# Patient Record
Sex: Male | Born: 1970
Health system: Southern US, Community
[De-identification: ages and names within clinical notes are randomized; demographics above are authoritative.]

## PROBLEM LIST (undated history)

## (undated) DIAGNOSIS — E785 Hyperlipidemia, unspecified: Secondary | ICD-10-CM

## (undated) DIAGNOSIS — I10 Essential (primary) hypertension: Secondary | ICD-10-CM

## (undated) DIAGNOSIS — M549 Dorsalgia, unspecified: Secondary | ICD-10-CM

## (undated) DIAGNOSIS — E119 Type 2 diabetes mellitus without complications: Secondary | ICD-10-CM

## (undated) DIAGNOSIS — T7840XA Allergy, unspecified, initial encounter: Secondary | ICD-10-CM

## (undated) DIAGNOSIS — M1990 Unspecified osteoarthritis, unspecified site: Secondary | ICD-10-CM

## (undated) DIAGNOSIS — K219 Gastro-esophageal reflux disease without esophagitis: Secondary | ICD-10-CM

## (undated) HISTORY — DX: Allergy, unspecified, initial encounter: T78.40XA

## (undated) HISTORY — DX: Gastro-esophageal reflux disease without esophagitis: K21.9

## (undated) HISTORY — DX: Dorsalgia, unspecified: M54.9

## (undated) HISTORY — DX: Hyperlipidemia, unspecified: E78.5

## (undated) HISTORY — PX: OTHER SURGICAL HISTORY: SHX169

## (undated) HISTORY — DX: Unspecified osteoarthritis, unspecified site: M19.90

## (undated) HISTORY — PX: FRACTURE SURGERY: SHX138

## (undated) HISTORY — PX: STERIOD INJECTION: SHX5046

---

## 2000-01-27 HISTORY — PX: HERNIA REPAIR: SHX51

## 2004-03-15 ENCOUNTER — Emergency Department: Payer: Self-pay | Admitting: Emergency Medicine

## 2005-07-20 ENCOUNTER — Emergency Department: Payer: Self-pay | Admitting: Emergency Medicine

## 2005-08-21 ENCOUNTER — Emergency Department: Payer: Self-pay | Admitting: Emergency Medicine

## 2005-08-29 ENCOUNTER — Emergency Department: Payer: Self-pay | Admitting: Emergency Medicine

## 2005-09-02 ENCOUNTER — Ambulatory Visit: Payer: Self-pay | Admitting: Nurse Practitioner

## 2006-01-26 HISTORY — PX: NASAL SINUS SURGERY: SHX719

## 2006-03-30 ENCOUNTER — Ambulatory Visit: Payer: Self-pay | Admitting: Otolaryngology

## 2006-07-03 ENCOUNTER — Emergency Department: Payer: Self-pay | Admitting: Emergency Medicine

## 2007-01-03 ENCOUNTER — Ambulatory Visit: Payer: Self-pay | Admitting: Gastroenterology

## 2008-04-23 IMAGING — CR RIGHT ELBOW - COMPLETE 3+ VIEW
1 series · 4 of 4 positions shown · non-contrast
Comparison: none

REASON FOR EXAM: Injury
COMMENTS:

[Series 1: view not recorded · 0.17mm/px · 4 of 4 slices shown]
[im 1/4]
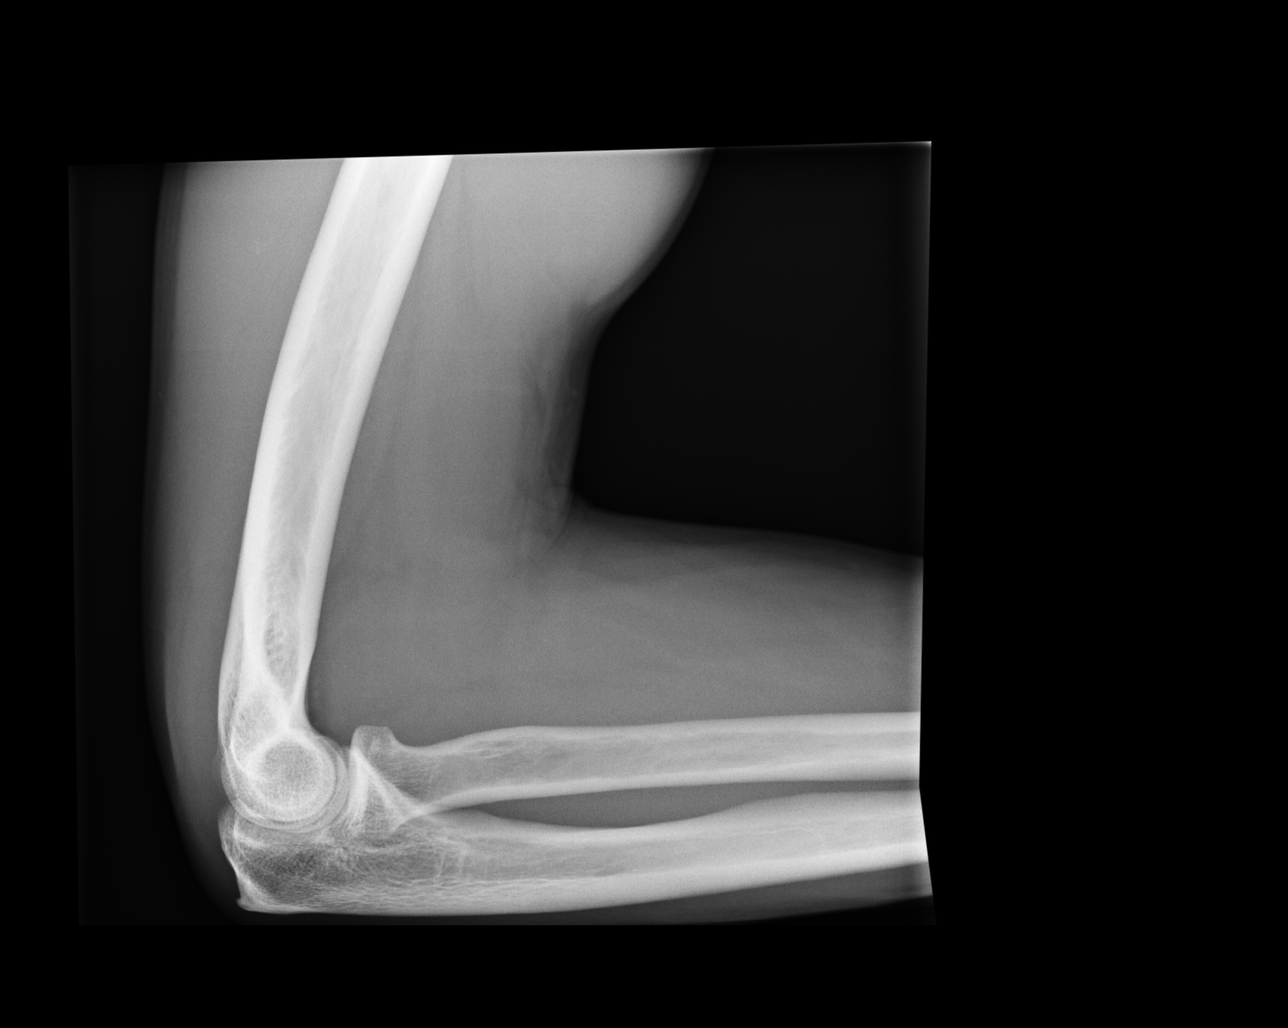
[im 2/4]
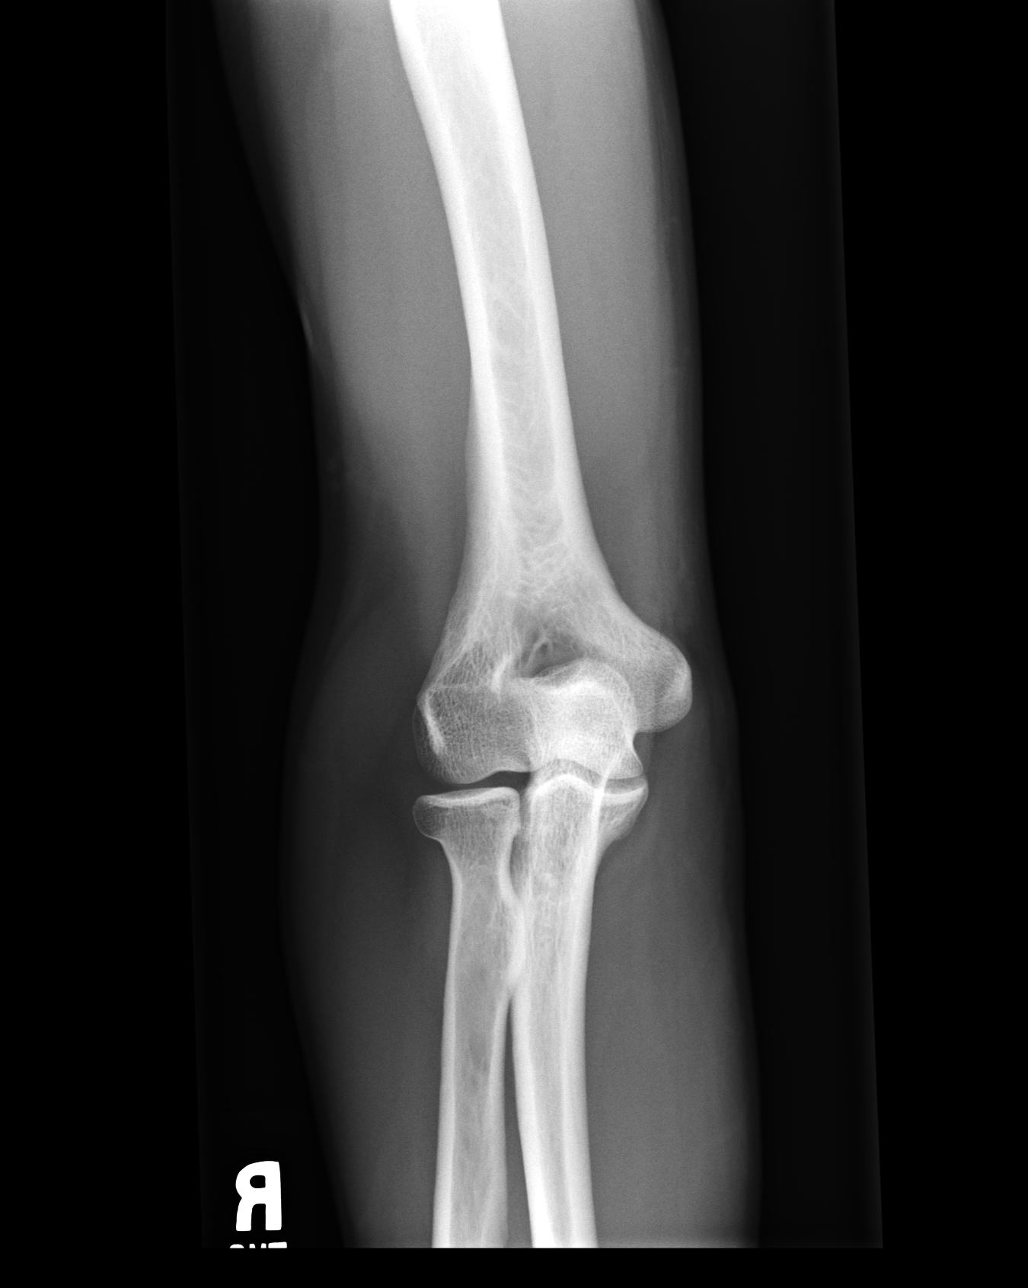
[im 3/4]
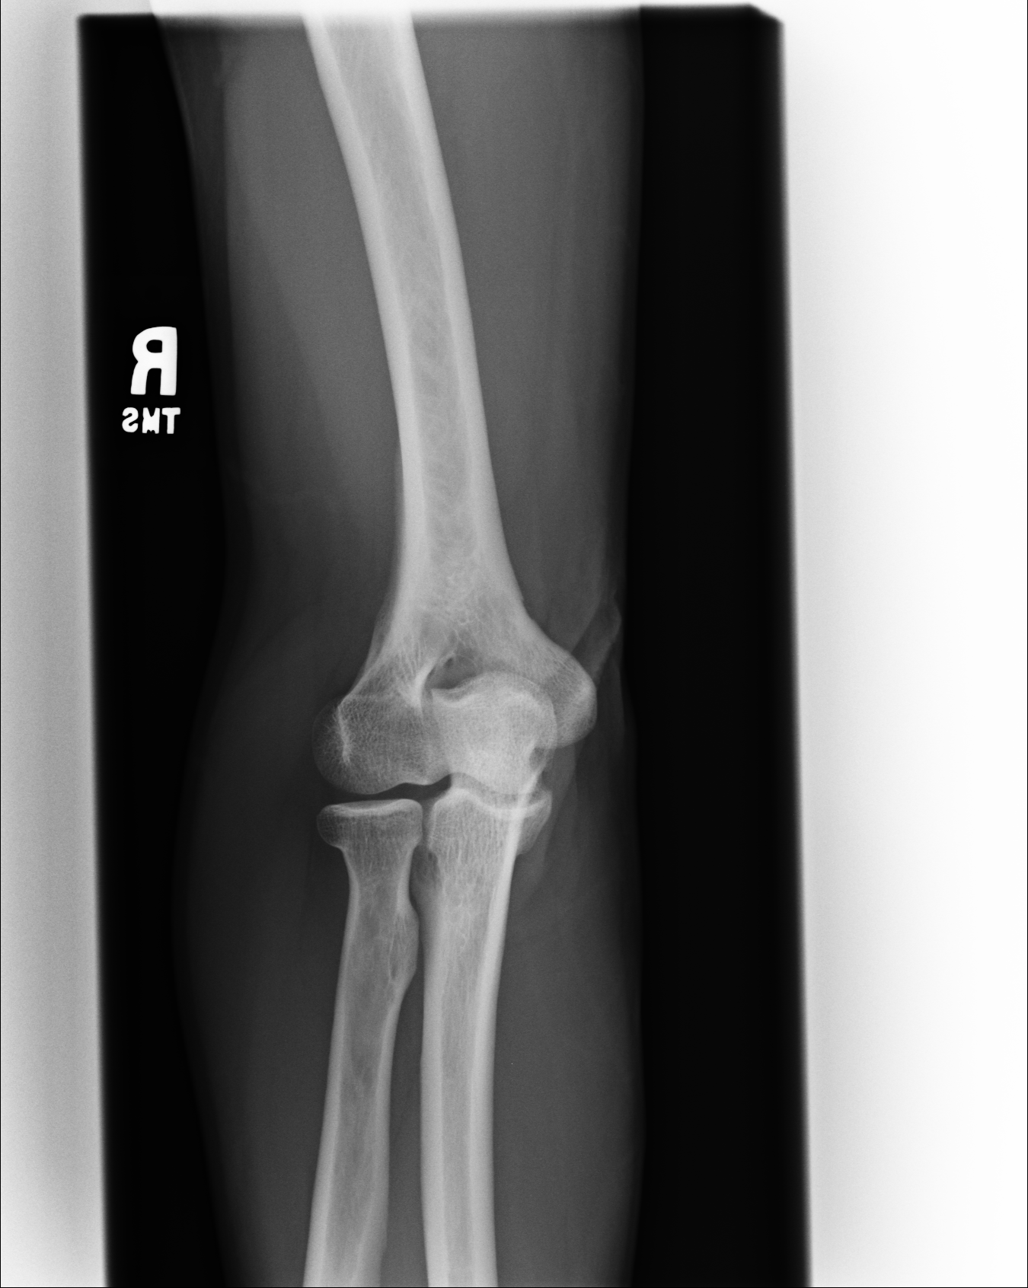
[im 4/4]
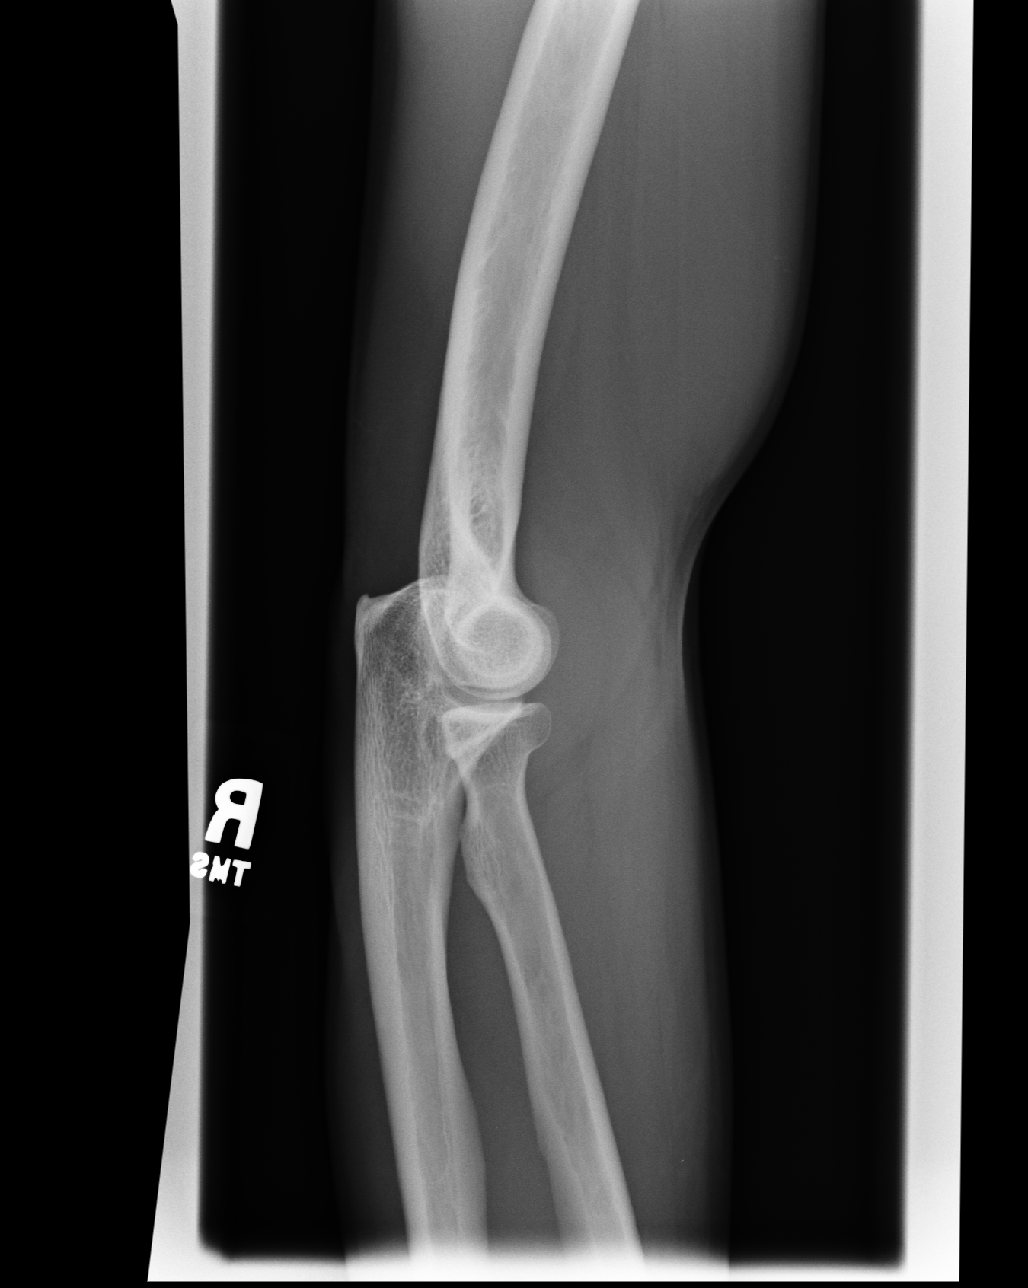

[4 of 4 positions shown; findings below may reference images not displayed]

PROCEDURE:     DXR - DXR ELBOW RT COMP W/OBLIQUES  - July 21, 2005  [DATE]

RESULT:     Views of the RIGHT elbow reveal the bones to be adequately
mineralized.  The radial head is intact.  There is a tiny olecranon spur.  I
do not see evidence of a joint effusion.  The supracondylar regions are
intact.
IMPRESSION: I do not see objective evidence of acute fracture of the RIGHT elbow.

## 2009-02-06 ENCOUNTER — Ambulatory Visit: Payer: Self-pay | Admitting: Otolaryngology

## 2009-04-08 ENCOUNTER — Ambulatory Visit: Payer: Self-pay | Admitting: Gastroenterology

## 2010-01-26 HISTORY — PX: HAND SURGERY: SHX662

## 2010-06-05 ENCOUNTER — Emergency Department: Payer: Self-pay | Admitting: Emergency Medicine

## 2010-06-12 ENCOUNTER — Ambulatory Visit: Payer: Self-pay | Admitting: Sports Medicine

## 2010-06-18 ENCOUNTER — Ambulatory Visit (HOSPITAL_BASED_OUTPATIENT_CLINIC_OR_DEPARTMENT_OTHER)
Admission: RE | Admit: 2010-06-18 | Discharge: 2010-06-18 | Disposition: A | Discharge status: Home / Self Care | Payer: BC Managed Care – PPO | Source: Ambulatory Visit | Attending: Orthopedic Surgery | Admitting: Orthopedic Surgery

## 2010-06-18 DIAGNOSIS — S62319A Displaced fracture of base of unspecified metacarpal bone, initial encounter for closed fracture: Secondary | ICD-10-CM | POA: Insufficient documentation

## 2010-06-18 DIAGNOSIS — IMO0002 Reserved for concepts with insufficient information to code with codable children: Secondary | ICD-10-CM | POA: Insufficient documentation

## 2010-06-18 DIAGNOSIS — K219 Gastro-esophageal reflux disease without esophagitis: Secondary | ICD-10-CM | POA: Insufficient documentation

## 2010-06-18 DIAGNOSIS — Y998 Other external cause status: Secondary | ICD-10-CM | POA: Insufficient documentation

## 2010-06-18 DIAGNOSIS — S62143A Displaced fracture of body of hamate [unciform] bone, unspecified wrist, initial encounter for closed fracture: Secondary | ICD-10-CM | POA: Insufficient documentation

## 2010-06-18 LAB — POCT HEMOGLOBIN-HEMACUE: Hemoglobin: 15.8 g/dL (ref 13.0–17.0)

## 2010-08-15 NOTE — Op Note (Signed)
NAMESALMAN, WELLEN               ACCOUNT NO.:  [id number]  MEDICAL RECORD NO.:  [id number]           PATIENT TYPE:  LOCATION:                                 FACILITY:  PHYSICIAN:  Cindee Salt, M.D.            DATE OF BIRTH:  DATE OF PROCEDURE:  06/18/2010 DATE OF DISCHARGE:                              OPERATIVE REPORT   PREOPERATIVE DIAGNOSES:  Fracture dislocations, right, middle, ring, and little fingers, carpometacarpal joints.  POSTOPERATIVE DIAGNOSES:  Fracture dislocations, right, middle, ring, and little fingers, carpometacarpal joints.  OPERATION:  Open reduction and internal fixation, carpometacarpal joints, right, middle, ring, and little fingers with pinning of fractured hamate, right hand.  SURGEON:  Cindee Salt, MD  ASSISTANT:  Artist Pais. Mina Marble, MD  ANESTHESIA:  Axillary general.  ANESTHESIOLOGIST:  Dr. Jean Rosenthal.  HISTORY:  The patient is a 40 year old male who suffered a fracture dislocation of the carpometacarpal joints middle, ring, and little fingers of his right hand when he punched a wall finding a stud.  X-rays revealed the fractures.  He was splinted and referred.  He has no prior history of injuries.  A CT scan revealed the fracture dislocations.  He is aware of risks and complications including infection, recurrence, injury to arteries, nerves, tendons, incomplete relief of symptoms, and dystrophy.  In the preoperative area, the patient is seen.  The extremity marked by both the patient and surgeon.  Antibiotic given.  PROCEDURE IN DETAIL:  The patient was brought to the operating room where an axillary block general anesthetic was given without difficulty. He was prepped using ChloraPrep, supine position, right arm free.  A 3- minute dry time was allowed.  Time-out taken confirming the patient and procedure.  The limb was exsanguinated with an Esmarch bandage. Tourniquet was placed high and the arm was inflated to 250 mmHg. Transverse  incision was made over the carpometacarpal of the middle, ring, and little finger, carried down through subcutaneous tissue. Bleeders were electrocauterized with bipolar.  Dorsal sensory nerve identified and protected.  The carpometacarpal joints were opened to each of the three fingers.  The fracture of the hamate was immediately apparent.  A fracture of the third metacarpal was also apparent with a dislocation of the ring.  The carpometacarpal fracture of the middle finger was reduced.  This was pinned with a 0.062 K-wire.  This brought the ring finger into position.  This was pinned with a 0.062 K-wire into the hamate, stabilizing it.  The remainder of the fractured hamate over the little finger was then reduced.  This was pinned with two 0.035 K- wires.  A size 0.045 K-wire was then used to pin the metacarpal to the hamate.  This maintained position of all three fractures and fracture of the hamate.  X-rays confirmed positioning.  These were reduced with flexion/extension.  There was normal alignment.  The wounds were irrigated.  The capsules were closed with figure-of-eight 4-0 Vicryl sutures, the subcutaneous tissue with interrupted 4-0 Vicryl, and the skin with interrupted 4-0 Vicryl Rapide sutures.  The pins left protruding through the skin were  bent and capped.  A sterile compressive dressing and splint applied.  On deflation of the tourniquet, all fingers immediately pinked; and he was taken to the recovery room for observation in satisfactory condition.  He will be discharged to home to return to the [facility] [place] in 1 week on Percocet.          ______________________________ Cindee Salt, M.D.     GK/MEDQ  D:  06/18/2010  T:  06/19/2010  Job:  [id number]  Electronically Signed by Cindee Salt M.D. on 08/15/2010 09:15:01 AM

## 2014-08-27 ENCOUNTER — Ambulatory Visit (INDEPENDENT_AMBULATORY_CARE_PROVIDER_SITE_OTHER): Payer: BLUE CROSS/BLUE SHIELD | Admitting: Family Medicine

## 2014-08-27 ENCOUNTER — Encounter: Payer: Self-pay | Admitting: Family Medicine

## 2014-08-27 VITALS — BP 132/78 | HR 61 | Temp 98.5°F | Ht 71.4 in | Wt 208.3 lb

## 2014-08-27 DIAGNOSIS — K649 Unspecified hemorrhoids: Secondary | ICD-10-CM | POA: Diagnosis not present

## 2014-08-27 DIAGNOSIS — K2100 Gastro-esophageal reflux disease with esophagitis, without bleeding: Secondary | ICD-10-CM

## 2014-08-27 DIAGNOSIS — E785 Hyperlipidemia, unspecified: Secondary | ICD-10-CM | POA: Diagnosis not present

## 2014-08-27 DIAGNOSIS — Z800 Family history of malignant neoplasm of digestive organs: Secondary | ICD-10-CM

## 2014-08-27 DIAGNOSIS — K589 Irritable bowel syndrome without diarrhea: Secondary | ICD-10-CM

## 2014-08-27 DIAGNOSIS — E663 Overweight: Secondary | ICD-10-CM

## 2014-08-27 DIAGNOSIS — Z23 Encounter for immunization: Secondary | ICD-10-CM | POA: Diagnosis not present

## 2014-08-27 LAB — LIPID PANEL PICCOLO, WAIVED
CHOL/HDL RATIO PICCOLO,WAIVE: 3.8 mg/dL
Cholesterol Piccolo, Waived: 166 mg/dL (ref <200)
HDL Chol Piccolo, Waived: 44 mg/dL — ABNORMAL LOW (ref >59)
LDL CHOL CALC PICCOLO WAIVED: 84 mg/dL (ref <100)
TRIGLYCERIDES PICCOLO,WAIVED: 191 mg/dL — AB (ref <150)
VLDL Chol Calc Piccolo,Waive: 38 mg/dL — ABNORMAL HIGH (ref <30)

## 2014-08-27 MED ORDER — RABEPRAZOLE SODIUM 20 MG PO TBEC: 20.0000 mg | DELAYED_RELEASE_TABLET | Freq: Every day | ORAL | Status: DC

## 2014-08-27 MED ORDER — PRAVASTATIN SODIUM 40 MG PO TABS: 40.0000 mg | ORAL_TABLET | Freq: Every day | ORAL | Status: DC

## 2014-08-27 MED ORDER — FLUNISOLIDE 25 MCG/ACT (0.025%) NA SOLN: 2.0000 | Freq: Every day | NASAL | Status: DC | PRN

## 2014-08-27 MED ORDER — TIZANIDINE HCL 4 MG PO TABS: 4.0000 mg | ORAL_TABLET | Freq: Three times a day (TID) | ORAL | Status: DC | PRN

## 2014-08-27 NOTE — Assessment & Plan Note (Signed)
Well controlled on current regimen. Continue current regimen. Continue to monitor.  

## 2014-08-27 NOTE — Patient Instructions (Signed)
Dyslipidemia  Dyslipidemia is an imbalance of the lipids in your blood. Lipids are waxy, fat-like proteins that your body needs in small amounts. Dyslipidemia often involves the lipids cholesterol or triglycerides. Common forms of dyslipidemia are:  · High levels of bad cholesterol (LDL cholesterol). LDL cholesterol is the type of cholesterol that causes heart disease.  · Low levels of good cholesterol (HDL cholesterol). HDL cholesterol is the type of cholesterol that helps protect against heart disease.  · High levels of triglycerides. Triglycerides are a fatty substance in the blood linked to a buildup of plaque on your arteries.  RISK FACTORS  · Increased age.  · Having a family history of high cholesterol.  · Certain medicines, including birth control pills, diuretics, beta-blockers, and some medicines for depression.  · Smoking.  · Eating a high-fat diet.  · Being overweight.  · Medical conditions such as diabetes, polycystic ovary syndrome, pregnancy, kidney disease, and hypothyroidism.  · Lack of regular exercise.  SIGNS AND SYMPTOMS  There are no signs or symptoms with dyslipidemia.   DIAGNOSIS   A simple blood test called a fasting blood test can be done to determine your level of:  · Total cholesterol. This is the combined number of LDL cholesterol and HDL cholesterol. A healthy number is lower than 200.  · LDL cholesterol. The goal number for LDL cholesterol is different for each person depending on risk factors. Ask your health care provider what your LDL cholesterol number should be.  · HDL cholesterol. A healthy level of HDL cholesterol is 60 or higher. A number lower than 40 for men or 50 for women is a danger sign.  · Triglycerides. A healthy triglyceride number is less than 150.  TREATMENT   Dyslipidemia is a treatable condition. Your health care provider will advise you on what type of treatment is best based on your age, your test results, and current guidelines. Treatment may include:   · Dietary  changes. A dietitian can help you create a meal plan. You may need to:  ¨ Eat more foods that contain omega-3s, such as salmon and other fish.  ¨ Replace saturated fats and trans fats in your diet with healthy fats such as nuts, seeds, avocados, olive oil, and canola oil.  · Regular exercise. This can help lower your LDL cholesterol, raise your HDL cholesterol, and help with weight management. Check with your health care provider before beginning an exercise program. Most people should participate in 30 minutes of brisk exercise 5 days a week.  · Quitting smoking.  · Medicines to lower LDL cholesterol and triglycerides.  Your health care provider will monitor your lipid levels with regular blood tests.  HOME CARE INSTRUCTIONS  · Eat a healthy diet. Follow any diet instructions if they were given to you by your health care provider.  · Maintain a healthy weight.  · Exercise regularly based on the recommendations of your health care provider.  · Do not use any tobacco products, including cigarettes, chewing tobacco, or electronic cigarettes.  · Take medicines only as directed by your health care provider.  · Keep all follow-up visits as directed by your health care provider.  SEEK MEDICAL CARE IF:  You are having possible side effects from your medicines.  Document Released: 01/17/2013 Document Revised: 05/29/2013 Document Reviewed: 01/17/2013  [employer]® Patient Information ©2015 [employer]. This information is not intended to replace advice given to you by your health care provider. Make sure you discuss any questions you have   with your health care provider.

## 2014-08-27 NOTE — Assessment & Plan Note (Signed)
Continue to follow with GI. Continue to monitor.

## 2014-08-27 NOTE — Assessment & Plan Note (Addendum)
Has been off medicine for about 2 months. Levels not as high as expected today. Will restart medicine and recheck in 3 months with LFTs and do PE at that time.

## 2014-08-27 NOTE — Assessment & Plan Note (Signed)
Continue to follow with GI. Continue to monitor.  

## 2014-08-27 NOTE — Progress Notes (Signed)
BP 132/78 mmHg  Pulse 61  Temp(Src) 98.5 F (36.9 C)  Ht 5' 11.4" (1.814 m)  Wt 208 lb 4.8 oz (94.484 kg)  BMI 28.71 kg/m2  SpO2 98%   Subjective:    Patient ID: [name], male    DOB: [birth year], 44 y.o.   MRN: [id number]  HPI: DARYLL SPISAK is a 44 y.o. male  Chief Complaint  Patient presents with  . Hyperlipidemia  . Gastrophageal Reflux   HYPERLIPIDEMIA Hyperlipidemia status: high triglycerides Satisfied with current treatment?  yes Side effects:  no Medication compliance: poor compliance- hasn't taken it in a couple of months Past cholesterol meds: pravastatin (pravachol) Supplements: none Aspirin:  no Chest pain:  no Coronary artery disease:  no Family history CAD:  no Family history early CAD:  no  GERD GERD control status: Under good control Satisfied with current treatment?: yes Heartburn frequency: not getting  Medication side effects: no Medication compliance: execellent Previous GERD medications: protonix, nexium, prilosec- all failed Antacid use frequency: rare Duration: chronic 15+ years Nature: burning Location: substernal Heartburn duration: minutes  Alleviatiating factors: medication Aggravating factors: spicy food Dysphagia: no Odynophagia: no Hematemesis: no  Blood in stool: previously, not recently EGD: yes   IBS- has had colonoscopies 4x, last one had a polyp and is due in 3 years. Stable. Tends to have more of the diarrhea   Relevant past medical, surgical, family and social history reviewed and updated as indicated. Interim medical history since our last visit reviewed. Allergies and medications reviewed and updated.  Review of Systems  Constitutional: Negative.   Respiratory: Negative.   Cardiovascular: Negative.   Gastrointestinal: Negative.   Genitourinary: Negative.   Musculoskeletal: Positive for back pain. Negative for myalgias, joint swelling, arthralgias, gait problem, neck pain and neck stiffness.   Psychiatric/Behavioral: Negative.     Per HPI unless specifically indicated above     Objective:    BP 132/78 mmHg  Pulse 61  Temp(Src) 98.5 F (36.9 C)  Ht 5' 11.4" (1.814 m)  Wt 208 lb 4.8 oz (94.484 kg)  BMI 28.71 kg/m2  SpO2 98%  Wt Readings from Last 3 Encounters:  08/27/14 208 lb 4.8 oz (94.484 kg)    Physical Exam  Constitutional: He is oriented to person, place, and time. He appears well-developed and well-nourished. No distress.  HENT:  Head: Normocephalic and atraumatic.  Right Ear: Hearing normal.  Left Ear: Hearing normal.  Nose: Nose normal.  Eyes: Conjunctivae and lids are normal. Right eye exhibits no discharge. Left eye exhibits no discharge. No scleral icterus.  Cardiovascular: Normal rate and regular rhythm.  Exam reveals no gallop and no friction rub.   No murmur heard. Pulmonary/Chest: Effort normal and breath sounds normal. No respiratory distress. He has no wheezes. He has no rales. He exhibits no tenderness.  Abdominal: Soft. Bowel sounds are normal. He exhibits distension. He exhibits no mass. There is no tenderness. There is no rebound and no guarding.  Musculoskeletal: Normal range of motion.  Neurological: He is alert and oriented to person, place, and time.  Skin: Skin is warm, dry and intact. No rash noted. No erythema. There is pallor.  Psychiatric: He has a normal mood and affect. His speech is normal and behavior is normal. Judgment and thought content normal. Cognition and memory are normal.  Nursing note and vitals reviewed.   Results for orders placed or performed during the hospital encounter of 06/18/10  Hemoglobin-hemacue, POC  Result Value Ref Range  Hemoglobin 15.8 13.0 - 17.0 g/dL      Assessment & Plan:   Problem List Items Addressed This Visit      Digestive   Gastroesophageal reflux disease with esophagitis    Well controlled on current regimen. Continue current regimen. Continue to monitor.       Relevant Orders    CBC with Differential/Platelet   Comprehensive metabolic panel   IBS (irritable bowel syndrome)    Continue to follow with GI. Continue to monitor.       Relevant Medications   RABEprazole (ACIPHEX) 20 MG tablet     Other   HLD (hyperlipidemia) - Primary    Has been off medicine for about 2 months. Levels not as high as expected today. Will restart medicine and recheck in 3 months with LFTs and do PE at that time.       Relevant Medications   pravastatin (PRAVACHOL) 40 MG tablet   Other Relevant Orders   Comprehensive metabolic panel   Lipid Panel Piccolo, Waived   Family history of colon cancer    Continue to follow with GI. Continue to monitor.        Other Visit Diagnoses    Hemorrhoids, unspecified hemorrhoid type        Relevant Medications    pravastatin (PRAVACHOL) 40 MG tablet    Other Relevant Orders    CBC with Differential/Platelet    Overweight        Will check CMP and TSH today. Await results. Work on diet and exercise.     Relevant Orders    Comprehensive metabolic panel    TSH    Immunization due        Relevant Orders    Tdap vaccine greater than or equal to 7yo IM (Completed)        Follow up plan: Return in about 3 months (around 11/27/2014) for Cholesterol recheck and PE.

## 2014-08-28 ENCOUNTER — Other Ambulatory Visit: Payer: Self-pay | Admitting: Orthopedic Surgery

## 2014-08-28 ENCOUNTER — Telehealth: Payer: Self-pay | Admitting: Family Medicine

## 2014-08-28 DIAGNOSIS — M5126 Other intervertebral disc displacement, lumbar region: Secondary | ICD-10-CM

## 2014-08-28 LAB — CBC WITH DIFFERENTIAL/PLATELET
Basophils Absolute: 0 10*3/uL (ref 0.0–0.2)
Basos: 0 %
EOS (ABSOLUTE): 0.1 10*3/uL (ref 0.0–0.4)
EOS: 2 %
Hematocrit: 45.4 % (ref 37.5–51.0)
Hemoglobin: 15.5 g/dL (ref 12.6–17.7)
IMMATURE GRANS (ABS): 0 10*3/uL (ref 0.0–0.1)
IMMATURE GRANULOCYTES: 0 %
LYMPHS ABS: 1.8 10*3/uL (ref 0.7–3.1)
Lymphs: 25 %
MCH: 31.1 pg (ref 26.6–33.0)
MCHC: 34.1 g/dL (ref 31.5–35.7)
MCV: 91 fL (ref 79–97)
MONOCYTES: 7 %
Monocytes Absolute: 0.5 10*3/uL (ref 0.1–0.9)
NEUTROS ABS: 4.7 10*3/uL (ref 1.4–7.0)
Neutrophils: 66 %
PLATELETS: 197 10*3/uL (ref 150–379)
RBC: 4.99 x10E6/uL (ref 4.14–5.80)
RDW: 13.3 % (ref 12.3–15.4)
WBC: 7.1 10*3/uL (ref 3.4–10.8)

## 2014-08-28 LAB — COMPREHENSIVE METABOLIC PANEL
ALBUMIN: 4.2 g/dL (ref 3.5–5.5)
ALK PHOS: 167 IU/L — AB (ref 39–117)
ALT: 32 IU/L (ref 0–44)
AST: 18 IU/L (ref 0–40)
Albumin/Globulin Ratio: 1.6 (ref 1.1–2.5)
BUN / CREAT RATIO: 17 (ref 9–20)
BUN: 17 mg/dL (ref 6–24)
Bilirubin Total: 0.4 mg/dL (ref 0.0–1.2)
CALCIUM: 9.5 mg/dL (ref 8.7–10.2)
CHLORIDE: 100 mmol/L (ref 97–108)
CO2: 23 mmol/L (ref 18–29)
CREATININE: 1 mg/dL (ref 0.76–1.27)
GFR calc non Af Amer: 92 mL/min/{1.73_m2} (ref >59)
GFR, EST AFRICAN AMERICAN: 107 mL/min/{1.73_m2} (ref >59)
Globulin, Total: 2.6 g/dL (ref 1.5–4.5)
Glucose: 175 mg/dL — ABNORMAL HIGH (ref 65–99)
POTASSIUM: 4.7 mmol/L (ref 3.5–5.2)
SODIUM: 140 mmol/L (ref 134–144)
Total Protein: 6.8 g/dL (ref 6.0–8.5)

## 2014-08-28 LAB — TSH: TSH: 2.44 u[IU]/mL (ref 0.450–4.500)

## 2014-08-29 LAB — HGB A1C W/O EAG: Hgb A1c MFr Bld: 6.8 % — ABNORMAL HIGH (ref 4.8–5.6)

## 2014-08-29 LAB — SPECIMEN STATUS REPORT

## 2014-08-29 NOTE — Telephone Encounter (Signed)
Patient has diabetes- can we get him in for an appointment to discuss this? Thanks!

## 2014-08-30 NOTE — Telephone Encounter (Signed)
Put patient on the schedule for Monday September 03, 2014 @ 1:00pm, if he can not make that it can be changed to another time, I just wanted to make sure that this spot was not taken.

## 2014-08-30 NOTE — Telephone Encounter (Signed)
LVM

## 2014-08-31 ENCOUNTER — Telehealth: Payer: Self-pay | Admitting: Family Medicine

## 2014-08-31 DIAGNOSIS — E119 Type 2 diabetes mellitus without complications: Secondary | ICD-10-CM | POA: Insufficient documentation

## 2014-08-31 MED ORDER — BLOOD GLUCOSE MONITOR KIT: PACK | Status: DC

## 2014-08-31 NOTE — Telephone Encounter (Signed)
Forward to provider

## 2014-08-31 NOTE — Telephone Encounter (Signed)
Called and spoke to [place]. Diagnosis of diabetes. His father in law has diabetes and will teach him how to test. Will send him education. OK to hold on appointment for right now.

## 2014-08-31 NOTE — Telephone Encounter (Signed)
Pt's wife called stated they can not afford to pay another co pay for Mondays visit. Could his lab results be discussed over the phone or is there a way they can not be charged for the visit. Pt has to make a payment on an MRI on Tuesday. Please call wife ASAP. Thanks.

## 2014-09-03 ENCOUNTER — Ambulatory Visit: Payer: Self-pay | Admitting: Family Medicine

## 2014-09-04 ENCOUNTER — Ambulatory Visit
Admission: RE | Admit: 2014-09-04 | Discharge: 2014-09-04 | Disposition: A | Discharge status: Home / Self Care | Payer: BLUE CROSS/BLUE SHIELD | Source: Ambulatory Visit | Attending: Orthopedic Surgery | Admitting: Orthopedic Surgery

## 2014-09-04 ENCOUNTER — Other Ambulatory Visit: Payer: Self-pay | Admitting: Orthopedic Surgery

## 2014-09-04 DIAGNOSIS — M5126 Other intervertebral disc displacement, lumbar region: Secondary | ICD-10-CM

## 2014-09-04 DIAGNOSIS — Z9889 Other specified postprocedural states: Secondary | ICD-10-CM

## 2014-09-04 DIAGNOSIS — M5136 Other intervertebral disc degeneration, lumbar region: Secondary | ICD-10-CM | POA: Diagnosis not present

## 2014-09-10 ENCOUNTER — Telehealth: Payer: Self-pay | Admitting: Family Medicine

## 2014-09-10 NOTE — Telephone Encounter (Signed)
Called and spoke with patient, diabetic information was mailed out a week or two ago, informed patient that if he doesn't receive it by Wednesday to give me a call and I will mail another one to him.

## 2014-09-10 NOTE — Telephone Encounter (Signed)
Pt called and wondered if we had sent out a packet about diabetes.

## 2014-09-19 ENCOUNTER — Encounter: Payer: Self-pay | Admitting: Family Medicine

## 2014-09-19 NOTE — Patient Instructions (Signed)
Blood Glucose Monitoring Monitoring your blood glucose (also know as blood sugar) helps you to manage your diabetes. It also helps you and your health care provider monitor your diabetes and determine how well your treatment plan is working. WHY SHOULD YOU MONITOR YOUR BLOOD GLUCOSE?  It can help you understand how food, exercise, and medicine affect your blood glucose.  It allows you to know what your blood glucose is at any given moment. You can quickly tell if you are having low blood glucose (hypoglycemia) or high blood glucose (hyperglycemia).  It can help you and your health care provider know how to adjust your medicines.  It can help you understand how to manage an illness or adjust medicine for exercise. WHEN SHOULD YOU TEST? Your health care provider will help you decide how often you should check your blood glucose. This may depend on the type of diabetes you have, your diabetes control, or the types of medicines you are taking. Be sure to write down all of your blood glucose readings so that this information can be reviewed with your health care provider. See below for examples of testing times that your health care provider may suggest. Type 1 Diabetes  Test 4 times a day if you are in good control, using an insulin pump, or perform multiple daily injections.  If your diabetes is not well controlled or if you are sick, you may need to monitor more often.  It is a good idea to also monitor:  Before and after exercise.  Between meals and 2 hours after a meal.  Occasionally between 2:00 a.m. and 3:00 a.m. Type 2 Diabetes  It can vary with each person, but generally, if you are on insulin, test 4 times a day.  If you take medicines by mouth (orally), test 2 times a day.  If you are on a controlled diet, test once a day.  If your diabetes is not well controlled or if you are sick, you may need to monitor more often. HOW TO MONITOR YOUR BLOOD GLUCOSE Supplies  Needed  Blood glucose meter.  Test strips for your meter. Each meter has its own strips. You must use the strips that go with your own meter.  A pricking needle (lancet).  A device that holds the lancet (lancing device).  A journal or log book to write down your results. Procedure  Wash your hands with soap and water. Alcohol is not preferred.  Prick the side of your finger (not the tip) with the lancet.  Gently milk the finger until a small drop of blood appears.  Follow the instructions that come with your meter for inserting the test strip, applying blood to the strip, and using your blood glucose meter. Other Areas to Get Blood for Testing Some meters allow you to use other areas of your body (other than your finger) to test your blood. These areas are called alternative sites. The most common alternative sites are:  The forearm.  The thigh.  The back area of the lower leg.  The palm of the hand. The blood flow in these areas is slower. Therefore, the blood glucose values you get may be delayed, and the numbers are different from what you would get from your fingers. Do not use alternative sites if you think you are having hypoglycemia. Your reading will not be accurate. Always use a finger if you are having hypoglycemia. Also, if you cannot feel your lows (hypoglycemia unawareness), always use your fingers for your  blood glucose checks. ADDITIONAL TIPS FOR GLUCOSE MONITORING  Do not reuse lancets.  Always carry your supplies with you.  All blood glucose meters have a 24-hour "hotline" number to call if you have questions or need help.  Adjust (calibrate) your blood glucose meter with a control solution after finishing a few boxes of strips. BLOOD GLUCOSE RECORD KEEPING It is a good idea to keep a daily record or log of your blood glucose readings. Most glucose meters, if not all, keep your glucose records stored in the meter. Some meters come with the ability to download  your records to your home computer. Keeping a record of your blood glucose readings is especially helpful if you are wanting to look for patterns. Make notes to go along with the blood glucose readings because you might forget what happened at that exact time. Keeping good records helps you and your health care provider to work together to achieve good diabetes management.  Document Released: 01/15/2003 Document Revised: 05/29/2013 Document Reviewed: 06/06/2012 [facility] Patient Information 2015 [place], [place]. This information is not intended to replace advice given to you by your health care provider. Make sure you discuss any questions you have with your health care provider. Diabetes and Exercise Exercising regularly is important. It is not just about losing weight. It has many health benefits, such as:  Improving your overall fitness, flexibility, and endurance.  Increasing your bone density.  Helping with weight control.  Decreasing your body fat.  Increasing your muscle strength.  Reducing stress and tension.  Improving your overall health. People with diabetes who exercise gain additional benefits because exercise:  Reduces appetite.  Improves the body's use of blood sugar (glucose).  Helps lower or control blood glucose.  Decreases blood pressure.  Helps control blood lipids (such as cholesterol and triglycerides).  Improves the body's use of the hormone insulin by:  Increasing the body's insulin sensitivity.  Reducing the body's insulin needs.  Decreases the risk for heart disease because exercising:  Lowers cholesterol and triglycerides levels.  Increases the levels of good cholesterol (such as high-density lipoproteins [HDL]) in the body.  Lowers blood glucose levels. YOUR ACTIVITY PLAN  Choose an activity that you enjoy and set realistic goals. Your health care provider or diabetes educator can help you make an activity plan that works for you. Exercise  regularly as directed by your health care provider. This includes:  Performing resistance training twice a week such as push-ups, sit-ups, lifting weights, or using resistance bands.  Performing 150 minutes of cardio exercises each week such as walking, running, or playing sports.  Staying active and spending no more than 90 minutes at one time being inactive. Even short bursts of exercise are good for you. Three 10-minute sessions spread throughout the day are just as beneficial as a single 30-minute session. Some exercise ideas include:  Taking the dog for a walk.  Taking the stairs instead of the elevator.  Dancing to your favorite song.  Doing an exercise video.  Doing your favorite exercise with a friend. RECOMMENDATIONS FOR EXERCISING WITH TYPE 1 OR TYPE 2 DIABETES   Check your blood glucose before exercising. If blood glucose levels are greater than 240 mg/dL, check for urine ketones. Do not exercise if ketones are present.  Avoid injecting insulin into areas of the body that are going to be exercised. For example, avoid injecting insulin into:  The arms when playing tennis.  The legs when jogging.  Keep a record of:  Food  intake before and after you exercise.  Expected peak times of insulin action.  Blood glucose levels before and after you exercise.  The type and amount of exercise you have done.  Review your records with your health care provider. Your health care provider will help you to develop guidelines for adjusting food intake and insulin amounts before and after exercising.  If you take insulin or oral hypoglycemic agents, watch for signs and symptoms of hypoglycemia. They include:  Dizziness.  Shaking.  Sweating.  Chills.  Confusion.  Drink plenty of water while you exercise to prevent dehydration or heat stroke. Body water is lost during exercise and must be replaced.  Talk to your health care provider before starting an exercise program to  make sure it is safe for you. Remember, almost any type of activity is better than none. Document Released: 04/04/2003 Document Revised: 05/29/2013 Document Reviewed: 06/21/2012 [facility] Patient Information 2015 [place], [place]. This information is not intended to replace advice given to you by your health care provider. Make sure you discuss any questions you have with your health care provider. Diabetes and Sick Day Management Blood sugar (glucose) can be more difficult to control when you are sick. Colds, fever, flu, nausea, vomiting, and diarrhea are all examples of common illnesses that can cause problems for people with diabetes. Loss of body fluids (dehydration) from fever, vomiting, diarrhea, infection, and the stress of a sickness can all cause blood glucose levels to increase. Because of this, it is very important to take your diabetes medicines and to eat some form of carbohydrate food when you are sick. Liquid or soft foods are often tolerated, and they help to replace fluids. HOME CARE INSTRUCTIONS These main guidelines are intended for managing a short-term (24 hours or less) sickness:  Take your usual dose of insulin or oral diabetes medicine. An exception would be if you take any form of metformin. If you cannot eat or drink, you can become dehydrated and should not take this medicine.  Continue to take your insulin even if you are unable to eat solid foods or are vomiting. Your insulin dose may stay the same, or it may need to be increased when you are sick.  You will need to test your blood glucose more often, generally every 2-4 hours. If you have type 1 diabetes, test your urine for ketones every 4 hours. If you have type 2 diabetes, test your urine for ketones as directed by your health care provider.  Eat some form of food that contains carbohydrates. The carbohydrates can be in solid or liquid form. You should eat 45-50 g of carbohydrates every 3-4 hours.  Replace fluids if you  have a fever, vomit, or have diarrhea. Ask your health care provider for specific rehydration instructions.  Watch carefully for the signs of ketoacidosis if you have type 1 diabetes. Call your health care provider if any of the following symptoms are present, especially in children:  Moderate to large ketones in the urine along with a high blood glucose level.  Severe nausea.  Vomiting.  Diarrhea.  Abdominal pain.  Rapid breathing.  Drink extra liquids that do not contain sugar such as water.  Be careful with over-the-counter medicines. Read the labels. They may contain sugar or types of sugars that can increase your blood glucose level. Food Choices for Illness All of the food choices below contain about 15 g of carbohydrates. Plan ahead and keep some of these foods around.    to  cup carbonated beverage containing sugar. Carbonated beverages will usually be better tolerated if they are opened and left at room temperature for a few minutes.   of a twin frozen ice pop.   cup regular gelatin.   cup juice.   cup ice cream or frozen yogurt.   cup cooked cereal.   cup sherbet.  1 cup clear broth or soup.  1 cup cream soup.   cup regular custard.   cup regular pudding.  1 cup sports drink.  1 cup plain yogurt.  1 slice toast.  6 squares saltine crackers.  5 vanilla wafers. SEEK MEDICAL CARE IF:   You are unable to drink fluids, even small amounts.  You have nausea and vomiting for more than 6 hours.  You have diarrhea for more than 6 hours.  Your blood glucose level is more than 240 mg/dL, even with additional insulin.  There is a change in mental status.  You develop an additional serious sickness.  You have been sick for 2 days and are not getting better.  You have a fever. SEEK IMMEDIATE MEDICAL CARE IF:  You have difficulty breathing.  You have moderate to large ketone levels. MAKE SURE YOU:  Understand these instructions.  Will  watch your condition.  Will get help right away if you are not doing well or get worse. Document Released: 01/15/2003 Document Revised: 05/29/2013 Document Reviewed: 06/21/2012 [facility] Patient Information 2015 [place], [place]. This information is not intended to replace advice given to you by your health care provider. Make sure you discuss any questions you have with your health care provider. Diabetes and Standards of Medical Care Diabetes is complicated. You may find that your diabetes team includes a dietitian, nurse, diabetes educator, eye doctor, and more. To help everyone know what is going on and to help you get the care you deserve, the following schedule of care was developed to help keep you on track. Below are the tests, exams, vaccines, medicines, education, and plans you will need. HbA1c test This test shows how well you have controlled your glucose over the past 2-3 months. It is used to see if your diabetes management plan needs to be adjusted.   It is performed at least 2 times a year if you are meeting treatment goals.  It is performed 4 times a year if therapy has changed or if you are not meeting treatment goals. Blood pressure test  This test is performed at every routine medical visit. The goal is less than 140/90 mm Hg for most people, but 130/80 mm Hg in some cases. Ask your health care provider about your goal. Dental exam  Follow up with the dentist regularly. Eye exam  If you are diagnosed with type 1 diabetes as a child, get an exam upon reaching the age of 49 years or older and have had diabetes for 3-5 years. Yearly eye exams are recommended after that initial eye exam.  If you are diagnosed with type 1 diabetes as an adult, get an exam within 5 years of diagnosis and then yearly.  If you are diagnosed with type 2 diabetes, get an exam as soon as possible after the diagnosis and then yearly. Foot care exam  Visual foot exams are performed at every routine  medical visit. The exams check for cuts, injuries, or other problems with the feet.  A comprehensive foot exam should be done yearly. This includes visual inspection as well as assessing foot pulses and testing for loss of  sensation.  Check your feet nightly for cuts, injuries, or other problems with your feet. Tell your health care provider if anything is not healing. Kidney function test (urine microalbumin)  This test is performed once a year.  Type 1 diabetes: The first test is performed 5 years after diagnosis.  Type 2 diabetes: The first test is performed at the time of diagnosis.  A serum creatinine and estimated glomerular filtration rate (eGFR) test is done once a year to assess the level of chronic kidney disease (CKD), if present. Lipid profile (cholesterol, HDL, LDL, triglycerides)  Performed every 5 years for most people.  The goal for LDL is less than 100 mg/dL. If you are at high risk, the goal is less than 70 mg/dL.  The goal for HDL is 40 mg/dL-50 mg/dL for men and 50 mg/dL-60 mg/dL for women. An HDL cholesterol of 60 mg/dL or higher gives some protection against heart disease.  The goal for triglycerides is less than 150 mg/dL. Influenza vaccine, pneumococcal vaccine, and hepatitis B vaccine  The influenza vaccine is recommended yearly.  It is recommended that people with diabetes who are over 54 years old get the pneumonia vaccine. In some cases, two separate shots may be given. Ask your health care provider if your pneumonia vaccination is up to date.  The hepatitis B vaccine is also recommended for adults with diabetes. Diabetes self-management education  Education is recommended at diagnosis and ongoing as needed. Treatment plan  Your treatment plan is reviewed at every medical visit. Document Released: 11/09/2008 Document Revised: 05/29/2013 Document Reviewed: 06/14/2012 [facility] Patient Information 2015 [place], [place]. This information is not intended to  replace advice given to you by your health care provider. Make sure you discuss any questions you have with your health care provider. Diabetes Mellitus and Food It is important for you to manage your blood sugar (glucose) level. Your blood glucose level can be greatly affected by what you eat. Eating healthier foods in the appropriate amounts throughout the day at about the same time each day will help you control your blood glucose level. It can also help slow or prevent worsening of your diabetes mellitus. Healthy eating may even help you improve the level of your blood pressure and reach or maintain a healthy weight.  HOW CAN FOOD AFFECT ME? Carbohydrates Carbohydrates affect your blood glucose level more than any other type of food. Your dietitian will help you determine how many carbohydrates to eat at each meal and teach you how to count carbohydrates. Counting carbohydrates is important to keep your blood glucose at a healthy level, especially if you are using insulin or taking certain medicines for diabetes mellitus. Alcohol Alcohol can cause sudden decreases in blood glucose (hypoglycemia), especially if you use insulin or take certain medicines for diabetes mellitus. Hypoglycemia can be a life-threatening condition. Symptoms of hypoglycemia (sleepiness, dizziness, and disorientation) are similar to symptoms of having too much alcohol.  If your health care provider has given you approval to drink alcohol, do so in moderation and use the following guidelines:  Women should not have more than one drink per day, and men should not have more than two drinks per day. One drink is equal to:  12 oz of beer.  5 oz of wine.  1 oz of hard liquor.  Do not drink on an empty stomach.  Keep yourself hydrated. Have water, diet soda, or unsweetened iced tea.  Regular soda, juice, and other mixers might contain a lot of  carbohydrates and should be counted. WHAT FOODS ARE NOT RECOMMENDED? As you  make food choices, it is important to remember that all foods are not the same. Some foods have fewer nutrients per serving than other foods, even though they might have the same number of calories or carbohydrates. It is difficult to get your body what it needs when you eat foods with fewer nutrients. Examples of foods that you should avoid that are high in calories and carbohydrates but low in nutrients include:  Trans fats (most processed foods list trans fats on the Nutrition Facts label).  Regular soda.  Juice.  Candy.  Sweets, such as cake, pie, doughnuts, and cookies.  Fried foods. WHAT FOODS CAN I EAT? Have nutrient-rich foods, which will nourish your body and keep you healthy. The food you should eat also will depend on several factors, including:  The calories you need.  The medicines you take.  Your weight.  Your blood glucose level.  Your blood pressure level.  Your cholesterol level. You also should eat a variety of foods, including:  Protein, such as meat, poultry, fish, tofu, nuts, and seeds (lean animal proteins are best).  Fruits.  Vegetables.  Dairy products, such as milk, cheese, and yogurt (low fat is best).  Breads, grains, pasta, cereal, rice, and beans.  Fats such as olive oil, trans fat-free margarine, canola oil, avocado, and olives. DOES EVERYONE WITH DIABETES MELLITUS HAVE THE SAME MEAL PLAN? Because every person with diabetes mellitus is different, there is not one meal plan that works for everyone. It is very important that you meet with a dietitian who will help you create a meal plan that is just right for you. Document Released: 10/09/2004 Document Revised: 01/17/2013 Document Reviewed: 12/09/2012 [facility] Patient Information 2015 [place], [place]. This information is not intended to replace advice given to you by your health care provider. Make sure you discuss any questions you have with your health care provider.

## 2014-09-19 NOTE — Progress Notes (Signed)
Newly diagnosed diabetic. Needs education.

## 2014-09-22 ENCOUNTER — Other Ambulatory Visit: Payer: Self-pay | Admitting: Family Medicine

## 2014-11-29 ENCOUNTER — Encounter: Payer: Self-pay | Admitting: Family Medicine

## 2014-11-29 ENCOUNTER — Ambulatory Visit (INDEPENDENT_AMBULATORY_CARE_PROVIDER_SITE_OTHER): Payer: BLUE CROSS/BLUE SHIELD | Admitting: Family Medicine

## 2014-11-29 VITALS — BP 152/91 | HR 76 | Temp 98.4°F | Ht 69.7 in | Wt 216.0 lb

## 2014-11-29 DIAGNOSIS — Z0000 Encounter for general adult medical examination without abnormal findings: Secondary | ICD-10-CM

## 2014-11-29 DIAGNOSIS — R030 Elevated blood-pressure reading, without diagnosis of hypertension: Secondary | ICD-10-CM

## 2014-11-29 DIAGNOSIS — E119 Type 2 diabetes mellitus without complications: Secondary | ICD-10-CM

## 2014-11-29 DIAGNOSIS — E785 Hyperlipidemia, unspecified: Secondary | ICD-10-CM

## 2014-11-29 DIAGNOSIS — Z23 Encounter for immunization: Secondary | ICD-10-CM

## 2014-11-29 DIAGNOSIS — IMO0001 Reserved for inherently not codable concepts without codable children: Secondary | ICD-10-CM

## 2014-11-29 LAB — LIPID PANEL PICCOLO, WAIVED
CHOL/HDL RATIO PICCOLO,WAIVE: 4.1 mg/dL
CHOLESTEROL PICCOLO, WAIVED: 170 mg/dL (ref <200)
HDL Chol Piccolo, Waived: 41 mg/dL — ABNORMAL LOW (ref >59)
LDL Chol Calc Piccolo Waived: 70 mg/dL (ref <100)
TRIGLYCERIDES PICCOLO,WAIVED: 292 mg/dL — AB (ref <150)
VLDL Chol Calc Piccolo,Waive: 58 mg/dL — ABNORMAL HIGH (ref <30)

## 2014-11-29 LAB — BAYER DCA HB A1C WAIVED: HB A1C: 6.5 % (ref <7.0)

## 2014-11-29 LAB — UA/M W/RFLX CULTURE, ROUTINE
BILIRUBIN UA: NEGATIVE
Glucose, UA: NEGATIVE
Ketones, UA: NEGATIVE
Leukocytes, UA: NEGATIVE
Nitrite, UA: NEGATIVE
PH UA: 6 (ref 5.0–7.5)
Protein, UA: NEGATIVE
RBC, UA: NEGATIVE
Specific Gravity, UA: 1.025 (ref 1.005–1.030)
UUROB: 0.2 mg/dL (ref 0.2–1.0)

## 2014-11-29 LAB — MICROALBUMIN, URINE WAIVED
Creatinine, Urine Waived: 200 mg/dL (ref 10–300)
Microalb, Ur Waived: 10 mg/L (ref 0–19)
Microalb/Creat Ratio: <30 mg/g (ref <30)

## 2014-11-29 NOTE — Progress Notes (Signed)
BP 152/91 mmHg  Pulse 76  Temp(Src) 98.4 F (36.9 C)  Ht 5' 9.7" (1.77 m)  Wt 216 lb (97.977 kg)  BMI 31.27 kg/m2  SpO2 98%   Subjective:    Patient ID: [name], male    DOB: 09-Dec-1970, 44 y.o.   MRN: [id number]  HPI: LORENSO QUIRINO is a 44 y.o. male presenting on 11/29/2014 for comprehensive medical examination. Current medical complaints include:  DIABETES Hypoglycemic episodes:no Polydipsia/polyuria: no Visual disturbance: yes Chest pain: no Paresthesias: no Glucose Monitoring: yes  Accucheck frequency: 4x a day Blood Pressure Monitoring: not checking Retinal Examination: Not up to Date Foot Exam: Up to Date Diabetic Education: Not Completed Pneumovax: Up to Date Influenza: Up to Date Aspirin: no  HYPERLIPIDEMIA Hyperlipidemia status: fair compliance Satisfied with current treatment?  yes Side effects:  no Medication compliance: excellent compliance Past cholesterol meds: pravastatin (pravachol) Supplements: none Aspirin:  no Chest pain:  no Coronary artery disease:  no Family history CAD:  yes Family history early CAD:  no  He currently lives with: wife Interim Problems from his last visit: no  Depression Screen done today and results listed below:  Depression screen [facility] 2/9 11/29/2014  Decreased Interest 0  Down, Depressed, Hopeless 0  PHQ - 2 Score 0    The patient does not have a history of falls. I did not complete a risk assessment for falls. A plan of care for falls was not documented.  Past Medical History:  Past Medical History  Diagnosis Date  . Hyperlipidemia   . GERD (gastroesophageal reflux disease)   . Back pain   . Allergy     Surgical History:  Past Surgical History  Procedure Laterality Date  . Hand surgery Right 2012    fracture- 5 pins and reconstruction  . Nasal sinus surgery  2008    Maxillary Sinus Removed  . Hernia repair  2002    Double  . Ulcer on his larynx    . Steriod injection      Medications:   Current Outpatient Prescriptions on File Prior to Visit  Medication Sig  . flunisolide (NASALIDE) 25 MCG/ACT (0.025%) SOLN Place 2 sprays into the nose daily as needed.  . pravastatin (PRAVACHOL) 40 MG tablet Take 1 tablet (40 mg total) by mouth daily.  . RABEprazole (ACIPHEX) 20 MG tablet Take 1 tablet (20 mg total) by mouth daily.   No current facility-administered medications on file prior to visit.    Allergies:  No Known Allergies  Social History:  Social History   Social History  . Marital Status: Married    Spouse Name: N/A  . Number of Children: N/A  . Years of Education: N/A   Occupational History  . Not on file.   Social History Main Topics  . Smoking status: Former Smoker    Quit date: 06/25/2005  . Smokeless tobacco: Former Neurosurgeon  . Alcohol Use: 7.2 oz/week    10 Cans of beer, 2 Shots of liquor per week  . Drug Use: No  . Sexual Activity: Yes   Other Topics Concern  . Not on file   Social History Narrative   History  Smoking status  . Former Smoker  . Quit date: 06/25/2005  Smokeless tobacco  . Former User   History  Alcohol Use  . 7.2 oz/week  . 10 Cans of beer, 2 Shots of liquor per week    Family History:  Family History  Problem Relation Age of Onset  .  Cancer Mother     Lung  . Cancer Father     colon  . Cancer Maternal Grandmother     breast, brain and colon  . Lymphoma Paternal Grandmother   . Cancer Paternal Grandfather     lung and throat    Past medical history, surgical history, medications, allergies, family history and social history reviewed with patient today and changes made to appropriate areas of the chart.   Review of Systems  Constitutional: Negative.   HENT: Negative.        TMJ issues   Eyes: Negative.   Respiratory: Negative.   Cardiovascular: Negative.   Gastrointestinal: Negative.  Negative for heartburn, nausea, vomiting, abdominal pain, diarrhea, constipation, blood in stool and melena.  Genitourinary:  Negative.   Musculoskeletal: Negative.   Skin: Negative.   Neurological: Negative.   Endo/Heme/Allergies: Positive for environmental allergies. Negative for polydipsia. Does not bruise/bleed easily.  Psychiatric/Behavioral: Negative.     All other ROS negative except what is listed above and in the HPI.      Objective:    BP 152/91 mmHg  Pulse 76  Temp(Src) 98.4 F (36.9 C)  Ht 5' 9.7" (1.77 m)  Wt 216 lb (97.977 kg)  BMI 31.27 kg/m2  SpO2 98%  Wt Readings from Last 3 Encounters:  11/29/14 216 lb (97.977 kg)  09/04/14 210 lb (95.255 kg)  08/27/14 208 lb 4.8 oz (94.484 kg)    Physical Exam  Constitutional: He is oriented to person, place, and time. He appears well-developed and well-nourished. No distress.  HENT:  Head: Normocephalic and atraumatic.  Right Ear: Hearing, tympanic membrane, external ear and ear canal normal.  Left Ear: Hearing, tympanic membrane, external ear and ear canal normal.  Nose: Nose normal.  Mouth/Throat: Uvula is midline, oropharynx is clear and moist and mucous membranes are normal. No oropharyngeal exudate, posterior oropharyngeal edema, posterior oropharyngeal erythema or tonsillar abscesses.  Eyes: Conjunctivae, EOM and lids are normal. Pupils are equal, round, and reactive to light. Right eye exhibits no discharge. Left eye exhibits no discharge. No scleral icterus.  Neck: Normal range of motion. Neck supple. No JVD present. No tracheal deviation present. No thyromegaly present.  Cardiovascular: Normal rate, regular rhythm, normal heart sounds and intact distal pulses.  Exam reveals no gallop and no friction rub.   No murmur heard. Pulmonary/Chest: Effort normal and breath sounds normal. No stridor. No respiratory distress. He has no wheezes. He exhibits no tenderness.  Abdominal: Soft. Bowel sounds are normal. He exhibits no distension and no mass. There is no tenderness. There is no rebound and no guarding. Hernia confirmed negative in the right  inguinal area and confirmed negative in the left inguinal area.  Genitourinary: Testes normal and penis normal. Cremasteric reflex is present. Circumcised. No penile tenderness.  Musculoskeletal: Normal range of motion. He exhibits no edema or tenderness.  Lymphadenopathy:    He has no cervical adenopathy.       Right: No inguinal adenopathy present.       Left: No inguinal adenopathy present.  Neurological: He is alert and oriented to person, place, and time. He has normal reflexes. He displays normal reflexes. No cranial nerve deficit. He exhibits normal muscle tone. Coordination normal.  Skin: Skin is warm, dry and intact. No rash noted. He is not diaphoretic. No erythema. No pallor.  Psychiatric: He has a normal mood and affect. His speech is normal and behavior is normal. Judgment and thought content normal. Cognition and memory are normal.  Nursing note and vitals reviewed.   Results for orders placed or performed in visit on 11/29/14  Lipid Panel Piccolo, [employer]  Result Value Ref Range   Cholesterol Piccolo, Waived 170 <200 mg/dL   HDL Chol Piccolo, Waived 41 (L) >59 mg/dL   Triglycerides Piccolo,Waived 292 (H) <150 mg/dL   Chol/HDL Ratio Piccolo,Waive 4.1 mg/dL   LDL Chol Calc Piccolo Waived 70 <100 mg/dL   VLDL Chol Calc Piccolo,Waive 58 (H) <30 mg/dL  Microalbumin, Urine Waived  Result Value Ref Range   Microalb, Ur Waived 10 0 - 19 mg/L   Creatinine, Urine Waived 200 10 - 300 mg/dL   Microalb/Creat Ratio <30 <30 mg/g  UA/M w/rflx Culture, Routine  Result Value Ref Range   Specific Gravity, UA 1.025 1.005 - 1.030   pH, UA 6.0 5.0 - 7.5   Color, UA Yellow Yellow   Appearance Ur Clear Clear   Leukocytes, UA Negative Negative   Protein, UA Negative Negative/Trace   Glucose, UA Negative Negative   Ketones, UA Negative Negative   RBC, UA Negative Negative   Bilirubin, UA Negative Negative   Urobilinogen, Ur 0.2 0.2 - 1.0 mg/dL   Nitrite, UA Negative Negative  Bayer DCA  Hb A1c Waived  Result Value Ref Range   Bayer DCA Hb A1c Waived 6.5 <7.0 %      Assessment & Plan:   Problem List Items Addressed This Visit      Endocrine   Diabetes mellitus type 2, diet-controlled (HCC)   Relevant Orders   Comprehensive metabolic panel   Microalbumin, Urine Waived (Completed)   UA/M w/rflx Culture, Routine (Completed)   Bayer DCA Hb A1c Waived (Completed)     Other   HLD (hyperlipidemia)   Relevant Orders   Comprehensive metabolic panel   Lipid Panel Piccolo, Waived (Completed)   Immunization due    Other Visit Diagnoses    Routine general medical examination at a health care facility    -  Primary    Up to date on vaccines. Screening labs checked today. Continue diet and exercise. Follow up in 3 months.     Elevated BP        Will monitor at home and will call if BP consistently over 140/90. Will check again next visit.         LABORATORY TESTING:  Health maintenance labs ordered today as discussed above.   IMMUNIZATIONS:   - Tdap: Tetanus vaccination status reviewed: tetanus re-vaccination not indicated. - Influenza: Up to date - Pneumovax: Administered today  PATIENT COUNSELING:    Sexuality: Discussed sexually transmitted diseases, partner selection, use of condoms, avoidance of unintended pregnancy  and contraceptive alternatives.   Advised to avoid cigarette smoking.  I discussed with the patient that most people either abstain from alcohol or drink within safe limits (<=14/week and <=4 drinks/occasion for males, <=7/weeks and <= 3 drinks/occasion for females) and that the risk for alcohol disorders and other health effects rises proportionally with the number of drinks per week and how often a drinker exceeds daily limits.  Discussed cessation/primary prevention of drug use and availability of treatment for abuse.   Diet: Encouraged to adjust caloric intake to maintain  or achieve ideal body weight, to reduce intake of dietary saturated fat  and total fat, to limit sodium intake by avoiding high sodium foods and not adding table salt, and to maintain adequate dietary potassium and calcium preferably from fresh fruits, vegetables, and low-fat dairy products.    stressed  the importance of regular exercise  Injury prevention: Discussed safety belts, safety helmets, smoke detector, smoking near bedding or upholstery.   Dental health: Discussed importance of regular tooth brushing, flossing, and dental visits.   Follow up plan: NEXT PREVENTATIVE PHYSICAL DUE IN 1 YEAR. Return for DM follow up.

## 2014-11-29 NOTE — Patient Instructions (Signed)

## 2014-11-30 ENCOUNTER — Encounter: Payer: Self-pay | Admitting: Family Medicine

## 2014-11-30 LAB — COMPREHENSIVE METABOLIC PANEL
ALK PHOS: 128 IU/L — AB (ref 39–117)
ALT: 26 IU/L (ref 0–44)
AST: 21 IU/L (ref 0–40)
Albumin/Globulin Ratio: 1.7 (ref 1.1–2.5)
Albumin: 4 g/dL (ref 3.5–5.5)
BUN / CREAT RATIO: 12 (ref 9–20)
BUN: 15 mg/dL (ref 6–24)
Bilirubin Total: 0.3 mg/dL (ref 0.0–1.2)
CALCIUM: 9.2 mg/dL (ref 8.7–10.2)
CO2: 27 mmol/L (ref 18–29)
CREATININE: 1.21 mg/dL (ref 0.76–1.27)
Chloride: 100 mmol/L (ref 97–106)
GFR calc Af Amer: 85 mL/min/{1.73_m2} (ref >59)
GFR calc non Af Amer: 73 mL/min/{1.73_m2} (ref >59)
GLOBULIN, TOTAL: 2.4 g/dL (ref 1.5–4.5)
Glucose: 131 mg/dL — ABNORMAL HIGH (ref 65–99)
Potassium: 5 mmol/L (ref 3.5–5.2)
SODIUM: 140 mmol/L (ref 136–144)
Total Protein: 6.4 g/dL (ref 6.0–8.5)

## 2014-12-24 ENCOUNTER — Other Ambulatory Visit: Payer: Self-pay

## 2014-12-24 MED ORDER — RABEPRAZOLE SODIUM 20 MG PO TBEC: 20.0000 mg | DELAYED_RELEASE_TABLET | Freq: Every day | ORAL | Status: DC

## 2014-12-24 NOTE — Telephone Encounter (Signed)
LAST VISIT: 11/29/2014  NEXT APPT: 03/01/2015  90 day supply request refill for Rabeprazole 20mg  tab.   Written 08/27/2014 #30 with 3 refills.

## 2015-01-03 ENCOUNTER — Ambulatory Visit (INDEPENDENT_AMBULATORY_CARE_PROVIDER_SITE_OTHER): Payer: BLUE CROSS/BLUE SHIELD | Admitting: Family Medicine

## 2015-01-03 ENCOUNTER — Encounter: Payer: Self-pay | Admitting: Family Medicine

## 2015-01-03 VITALS — BP 160/101 | HR 76 | Temp 98.9°F | Ht 70.5 in | Wt 220.4 lb

## 2015-01-03 DIAGNOSIS — H6092 Unspecified otitis externa, left ear: Secondary | ICD-10-CM

## 2015-01-03 DIAGNOSIS — I10 Essential (primary) hypertension: Secondary | ICD-10-CM | POA: Diagnosis not present

## 2015-01-03 MED ORDER — CIPROFLOXACIN-DEXAMETHASONE 0.3-0.1 % OT SUSP: 4.0000 [drp] | Freq: Two times a day (BID) | OTIC | Status: DC

## 2015-01-03 MED ORDER — LISINOPRIL 10 MG PO TABS: 10.0000 mg | ORAL_TABLET | Freq: Every day | ORAL | Status: DC

## 2015-01-03 NOTE — Assessment & Plan Note (Signed)
Will start him on lisinopril 10mg . Check back in in 1 month with BMP. Continue to monitor.

## 2015-01-03 NOTE — Progress Notes (Signed)
BP 160/101 mmHg  Pulse 76  Temp(Src) 98.9 F (37.2 C)  Ht 5' 10.5" (1.791 m)  Wt 220 lb 6.4 oz (99.973 kg)  BMI 31.17 kg/m2  SpO2 97%   Subjective:    Patient ID: Stephen Mayo, male    DOB: 06/20/1970, 44 y.o.   MRN: [id number]  HPI: Stephen Mayo is a 44 y.o. male  Chief Complaint  Patient presents with  . Ear Pain    Left   EAR PAIN Duration: 5 weeks Involved ear(s): left Severity:  moderate  Quality:  dull Fever: no Otorrhea: no Upper respiratory infection symptoms: no Pruritus: yes Hearing loss: no Water immersion no Using Q-tips: yes Recurrent otitis media: yes Status: stable Treatments attempted: none   ELEVATED BLOOD PRESSURE Duration of elevated BP: chronic BP monitoring frequency: not checking Previous BP meds: no Recent stressors: no Family history of hypertension: yes Recurrent headaches: no Visual changes: no Palpitations: no  Dyspnea: no Chest pain: no Lower extremity edema: no Dizzy/lightheaded: no Transient ischemic attacks: no  Relevant past medical, surgical, family and social history reviewed and updated as indicated. Interim medical history since our last visit reviewed. Allergies and medications reviewed and updated.  Review of Systems  Constitutional: Negative.   HENT: Negative.   Eyes: Negative.   Respiratory: Negative.   Cardiovascular: Negative.   Psychiatric/Behavioral: Negative.     Per HPI unless specifically indicated above     Objective:    BP 160/101 mmHg  Pulse 76  Temp(Src) 98.9 F (37.2 C)  Ht 5' 10.5" (1.791 m)  Wt 220 lb 6.4 oz (99.973 kg)  BMI 31.17 kg/m2  SpO2 97%  Wt Readings from Last 3 Encounters:  01/03/15 220 lb 6.4 oz (99.973 kg)  11/29/14 216 lb (97.977 kg)  09/04/14 210 lb (95.255 kg)    Physical Exam  Constitutional: He is oriented to person, place, and time. He appears well-developed and well-nourished. No distress.  HENT:  Head: Normocephalic and atraumatic.  Right Ear: Hearing,  tympanic membrane, external ear and ear canal normal.  Left Ear: Hearing, tympanic membrane and external ear normal.  Nose: Nose normal.  Mouth/Throat: Oropharynx is clear and moist. No oropharyngeal exudate.  Erythema and pus from 10 o'clock to 2 o'clock  Eyes: Conjunctivae, EOM and lids are normal. Pupils are equal, round, and reactive to light. Right eye exhibits no discharge. Left eye exhibits no discharge. No scleral icterus.  Neck: Normal range of motion. Neck supple. No JVD present. No tracheal deviation present. No thyromegaly present.  Cardiovascular: Normal rate, regular rhythm, normal heart sounds and intact distal pulses.  Exam reveals no gallop and no friction rub.   No murmur heard. Pulmonary/Chest: Effort normal and breath sounds normal. No stridor. No respiratory distress. He has no wheezes. He has no rales. He exhibits no tenderness.  Musculoskeletal: Normal range of motion.  Lymphadenopathy:    He has no cervical adenopathy.  Neurological: He is alert and oriented to person, place, and time.  Skin: Skin is warm, dry and intact. No rash noted. He is not diaphoretic. No erythema. No pallor.  Psychiatric: He has a normal mood and affect. His speech is normal and behavior is normal. Judgment and thought content normal. Cognition and memory are normal.  Nursing note and vitals reviewed.   Results for orders placed or performed in visit on 11/29/14  Comprehensive metabolic panel  Result Value Ref Range   Glucose 131 (H) 65 - 99 mg/dL   BUN 15 6 -  24 mg/dL   Creatinine, Ser 1.61 0.76 - 1.27 mg/dL   GFR calc non Af Amer 73 >59 mL/min/1.73   GFR calc Af Amer 85 >59 mL/min/1.73   BUN/Creatinine Ratio 12 9 - 20   Sodium 140 136 - 144 mmol/L   Potassium 5.0 3.5 - 5.2 mmol/L   Chloride 100 97 - 106 mmol/L   CO2 27 18 - 29 mmol/L   Calcium 9.2 8.7 - 10.2 mg/dL   Total Protein 6.4 6.0 - 8.5 g/dL   Albumin 4.0 3.5 - 5.5 g/dL   Globulin, Total 2.4 1.5 - 4.5 g/dL    Albumin/Globulin Ratio 1.7 1.1 - 2.5   Bilirubin Total 0.3 0.0 - 1.2 mg/dL   Alkaline Phosphatase 128 (H) 39 - 117 IU/L   AST 21 0 - 40 IU/L   ALT 26 0 - 44 IU/L  Lipid Panel Piccolo, Waived  Result Value Ref Range   Cholesterol Piccolo, Waived 170 <200 mg/dL   HDL Chol Piccolo, Waived 41 (L) >59 mg/dL   Triglycerides Piccolo,Waived 292 (H) <150 mg/dL   Chol/HDL Ratio Piccolo,Waive 4.1 mg/dL   LDL Chol Calc Piccolo Waived 70 <100 mg/dL   VLDL Chol Calc Piccolo,Waive 58 (H) <30 mg/dL  Microalbumin, Urine Waived  Result Value Ref Range   Microalb, Ur Waived 10 0 - 19 mg/L   Creatinine, Urine Waived 200 10 - 300 mg/dL   Microalb/Creat Ratio <30 <30 mg/g  UA/M w/rflx Culture, Routine  Result Value Ref Range   Specific Gravity, UA 1.025 1.005 - 1.030   pH, UA 6.0 5.0 - 7.5   Color, UA Yellow Yellow   Appearance Ur Clear Clear   Leukocytes, UA Negative Negative   Protein, UA Negative Negative/Trace   Glucose, UA Negative Negative   Ketones, UA Negative Negative   RBC, UA Negative Negative   Bilirubin, UA Negative Negative   Urobilinogen, Ur 0.2 0.2 - 1.0 mg/dL   Nitrite, UA Negative Negative  Bayer DCA Hb A1c Waived  Result Value Ref Range   Bayer DCA Hb A1c Waived 6.5 <7.0 %      Assessment & Plan:   Problem List Items Addressed This Visit      Cardiovascular and Mediastinum   HTN (hypertension) - Primary    Will start him on lisinopril . Check back in in 1 month with BMP. Continue to monitor.       Relevant Medications   lisinopril (PRINIVIL,ZESTRIL) 10 MG tablet    Other Visit Diagnoses    Otitis externa, left        Will treat with ciprodex. Stop q-tips. Call if not better or getting worse        Follow up plan: Return in about 4 weeks (around 01/31/2015) for BP check.

## 2015-02-07 ENCOUNTER — Telehealth: Payer: Self-pay

## 2015-02-07 NOTE — Telephone Encounter (Signed)
Lisinopril 10 mg tablets take one tablet daily, 90 day supply.

## 2015-02-07 NOTE — Telephone Encounter (Signed)
Being seen on 03/01/15- needs to be seen before 90 day supply

## 2015-02-19 LAB — HM DIABETES EYE EXAM

## 2015-03-01 ENCOUNTER — Other Ambulatory Visit: Payer: Self-pay | Admitting: Family Medicine

## 2015-03-01 ENCOUNTER — Ambulatory Visit (INDEPENDENT_AMBULATORY_CARE_PROVIDER_SITE_OTHER): Payer: BLUE CROSS/BLUE SHIELD | Admitting: Family Medicine

## 2015-03-01 ENCOUNTER — Encounter: Payer: Self-pay | Admitting: Family Medicine

## 2015-03-01 VITALS — BP 168/102 | HR 72 | Temp 98.8°F | Ht 71.0 in | Wt 223.0 lb

## 2015-03-01 DIAGNOSIS — E119 Type 2 diabetes mellitus without complications: Secondary | ICD-10-CM

## 2015-03-01 DIAGNOSIS — I10 Essential (primary) hypertension: Secondary | ICD-10-CM

## 2015-03-01 MED ORDER — RABEPRAZOLE SODIUM 20 MG PO TBEC: 20.0000 mg | DELAYED_RELEASE_TABLET | Freq: Every day | ORAL | Status: DC

## 2015-03-01 MED ORDER — LISINOPRIL 10 MG PO TABS: 10.0000 mg | ORAL_TABLET | Freq: Every day | ORAL | Status: DC

## 2015-03-01 MED ORDER — PRAVASTATIN SODIUM 40 MG PO TABS: 40.0000 mg | ORAL_TABLET | Freq: Every day | ORAL | Status: DC

## 2015-03-01 NOTE — Progress Notes (Signed)
BP 168/102 mmHg  Pulse 72  Temp(Src) 98.8 F (37.1 C)  Ht  (1.803 m)  Wt 223 lb (101.152 kg)  BMI 31.12 kg/m2  SpO2 97%   Subjective:    Patient ID: Stephen Mayo, male    DOB: 04-12-1970, 45 y.o.   MRN: [id number]  HPI: Stephen Mayo is a 45 y.o. male  Chief Complaint  Patient presents with  . Diabetes  . Hypertension   DIABETES Hypoglycemic episodes:no Polydipsia/polyuria: no Visual disturbance: no Chest pain: no Paresthesias: no Glucose Monitoring: no  Accucheck frequency: Daily Taking Insulin?: no Blood Pressure Monitoring: not checking Retinal Examination: Up to Date Foot Exam: Up to Date Diabetic Education: Completed Pneumovax: Up to Date Influenza: Up to Date Aspirin: yes  HYPERTENSION- has been out of his medicine for about a week Hypertension status: exacerbated  Satisfied with current treatment? yes Duration of hypertension: chronic BP monitoring frequency:  not checking BP medication side effects:  no Medication compliance: good compliance Aspirin: no Recurrent headaches: no Visual changes: no Palpitations: no Dyspnea: no Chest pain: no Lower extremity edema: no Dizzy/lightheaded: no  Relevant past medical, surgical, family and social history reviewed and updated as indicated. Interim medical history since our last visit reviewed. Allergies and medications reviewed and updated.  Review of Systems  Constitutional: Negative.   Respiratory: Negative.   Cardiovascular: Negative.   Musculoskeletal: Negative.   Psychiatric/Behavioral: Negative.     Per HPI unless specifically indicated above     Objective:    BP 168/102 mmHg  Pulse 72  Temp(Src) 98.8 F (37.1 C)  Ht  (1.803 m)  Wt 223 lb (101.152 kg)  BMI 31.12 kg/m2  SpO2 97%  Wt Readings from Last 3 Encounters:  03/01/15 223 lb (101.152 kg)  01/03/15 220 lb 6.4 oz (99.973 kg)  11/29/14 216 lb (97.977 kg)    Physical Exam  Constitutional: He is oriented to  person, place, and time. He appears well-developed and well-nourished. No distress.  HENT:  Head: Normocephalic and atraumatic.  Right Ear: Hearing normal.  Left Ear: Hearing normal.  Nose: Nose normal.  Eyes: Conjunctivae and lids are normal. Right eye exhibits no discharge. Left eye exhibits no discharge. No scleral icterus.  Cardiovascular: Normal rate, regular rhythm, normal heart sounds and intact distal pulses.  Exam reveals no gallop and no friction rub.   No murmur heard. Pulmonary/Chest: Effort normal and breath sounds normal. No respiratory distress. He has no wheezes. He has no rales. He exhibits no tenderness.  Musculoskeletal: Normal range of motion.  Neurological: He is alert and oriented to person, place, and time.  Skin: Skin is warm, dry and intact. No rash noted. No erythema. No pallor.  Psychiatric: He has a normal mood and affect. His speech is normal and behavior is normal. Judgment and thought content normal. Cognition and memory are normal.  Nursing note and vitals reviewed.   Results for orders placed or performed in visit on 02/21/15  HM DIABETES EYE EXAM  Result Value Ref Range   HM Diabetic Eye Exam No Retinopathy No Retinopathy      Assessment & Plan:   Problem List Items Addressed This Visit      Cardiovascular and Mediastinum   HTN (hypertension)    Elevated today. Will restart medicine and check back in in 3 months.       Relevant Medications   lisinopril (PRINIVIL,ZESTRIL) 10 MG tablet   pravastatin (PRAVACHOL) 40 MG tablet     Endocrine  Diabetes mellitus type 2, diet-controlled (HCC) - Primary    Sugar is creeping up. Will check again in 3 months. Will not add medicine at this time. Continue to monitor.       Relevant Medications   lisinopril (PRINIVIL,ZESTRIL) 10 MG tablet   pravastatin (PRAVACHOL) 40 MG tablet       Follow up plan: Return in about 3 months (around 05/29/2015).

## 2015-03-01 NOTE — Assessment & Plan Note (Signed)
Elevated today. Will restart medicine and check back in in 3 months.

## 2015-03-01 NOTE — Assessment & Plan Note (Signed)
Sugar is creeping up. Will check again in 3 months. Will not add medicine at this time. Continue to monitor.

## 2015-03-02 LAB — BAYER DCA HB A1C WAIVED: HB A1C (BAYER DCA - WAIVED): 6.8 % (ref <7.0)

## 2015-05-30 ENCOUNTER — Ambulatory Visit (INDEPENDENT_AMBULATORY_CARE_PROVIDER_SITE_OTHER): Payer: BLUE CROSS/BLUE SHIELD | Admitting: Family Medicine

## 2015-05-30 ENCOUNTER — Encounter: Payer: Self-pay | Admitting: Family Medicine

## 2015-05-30 ENCOUNTER — Other Ambulatory Visit: Payer: Self-pay | Admitting: Family Medicine

## 2015-05-30 VITALS — BP 128/74 | HR 84 | Temp 97.3°F | Wt 212.0 lb

## 2015-05-30 DIAGNOSIS — I10 Essential (primary) hypertension: Secondary | ICD-10-CM

## 2015-05-30 DIAGNOSIS — E119 Type 2 diabetes mellitus without complications: Secondary | ICD-10-CM | POA: Diagnosis not present

## 2015-05-30 LAB — BAYER DCA HB A1C WAIVED: HB A1C: 6.8 % (ref <7.0)

## 2015-05-30 MED ORDER — LOSARTAN POTASSIUM 25 MG PO TABS: 25.0000 mg | ORAL_TABLET | Freq: Every day | ORAL | Status: DC

## 2015-05-30 NOTE — Progress Notes (Signed)
BP 128/74 mmHg  Pulse 84  Temp(Src) 97.3 F (36.3 C)  Wt 212 lb (96.163 kg)  SpO2 100%   Subjective:    Patient ID: Stephen Mayo, male    DOB: [birth year], 45 y.o.   MRN: [id number]  HPI: Stephen Mayo is a 45 y.o. male  Chief Complaint  Patient presents with  . Diabetes  . Hypertension  . Cough   DIABETES Hypoglycemic episodes:no Polydipsia/polyuria: no Visual disturbance: no Chest pain: no Paresthesias: no Glucose Monitoring: yes  Accucheck frequency: Daily Taking Insulin?: no Blood Pressure Monitoring: not checking Retinal Examination: up to date Foot Exam: Up to Date Diabetic Education: Completed Pneumovax: Up to Date Influenza: Up to Date Aspirin: no  HYPERTENSION Hypertension status: controlled  Satisfied with current treatment? yes Duration of hypertension: chronic BP monitoring frequency:  not checking BP medication side effects:  yes, cough Medication compliance: excellent compliance Aspirin: no Recurrent headaches: no Visual changes: no Palpitations: no Dyspnea: no Chest pain: no Lower extremity edema: no Dizzy/lightheaded: no  Relevant past medical, surgical, family and social history reviewed and updated as indicated. Interim medical history since our last visit reviewed. Allergies and medications reviewed and updated.  Review of Systems  Constitutional: Negative.   Respiratory: Positive for cough. Negative for apnea, choking, chest tightness, shortness of breath, wheezing and stridor.   Cardiovascular: Negative.   Psychiatric/Behavioral: Negative.     Per HPI unless specifically indicated above     Objective:    BP 128/74 mmHg  Pulse 84  Temp(Src) 97.3 F (36.3 C)  Wt 212 lb (96.163 kg)  SpO2 100%  Wt Readings from Last 3 Encounters:  05/30/15 212 lb (96.163 kg)  03/01/15 223 lb (101.152 kg)  01/03/15 220 lb 6.4 oz (99.973 kg)    Physical Exam  Constitutional: He is oriented to person, place, and time. He appears  well-developed and well-nourished. No distress.  HENT:  Head: Normocephalic and atraumatic.  Right Ear: Hearing normal.  Left Ear: Hearing normal.  Nose: Nose normal.  Eyes: Conjunctivae and lids are normal. Right eye exhibits no discharge. Left eye exhibits no discharge. No scleral icterus.  Cardiovascular: Normal rate, regular rhythm, normal heart sounds and intact distal pulses.  Exam reveals no gallop and no friction rub.   No murmur heard. Pulmonary/Chest: Effort normal and breath sounds normal. No respiratory distress. He has no wheezes. He has no rales. He exhibits no tenderness.  Musculoskeletal: Normal range of motion.  Neurological: He is alert and oriented to person, place, and time.  Skin: Skin is warm, dry and intact. No rash noted. No erythema. No pallor.  Psychiatric: He has a normal mood and affect. His speech is normal and behavior is normal. Judgment and thought content normal. Cognition and memory are normal.  Nursing note and vitals reviewed.   Results for orders placed or performed in visit on 03/01/15  Bayer DCA Hb A1c Waived  Result Value Ref Range   Bayer DCA Hb A1c Waived 6.8 <7.0 %      Assessment & Plan:   Problem List Items Addressed This Visit      Cardiovascular and Mediastinum   HTN (hypertension)    Will change to losartan due to cough. Call with any concerns. Recheck 3 months.       Relevant Medications   losartan (COZAAR) 25 MG tablet     Endocrine   Diabetes mellitus type 2, diet-controlled (HCC) - Primary    Stable. Continue to work on diet and  exercise. Recheck in 3 months. Call with any problems.       Relevant Medications   losartan (COZAAR) 25 MG tablet       Follow up plan: Return in about 3 months (around 08/30/2015) for DM/HTN/HLD visit.

## 2015-05-30 NOTE — Assessment & Plan Note (Signed)
Stable. Continue to work on diet and exercise. Recheck in 3 months. Call with any problems.

## 2015-05-30 NOTE — Assessment & Plan Note (Signed)
Will change to losartan due to cough. Call with any concerns. Recheck 3 months.

## 2015-05-31 ENCOUNTER — Encounter: Payer: Self-pay | Admitting: Family Medicine

## 2015-05-31 LAB — BASIC METABOLIC PANEL
BUN/Creatinine Ratio: 10 (ref 9–20)
BUN: 13 mg/dL (ref 6–24)
CALCIUM: 9.7 mg/dL (ref 8.7–10.2)
CHLORIDE: 97 mmol/L (ref 96–106)
CO2: 26 mmol/L (ref 18–29)
Creatinine, Ser: 1.26 mg/dL (ref 0.76–1.27)
GFR, EST AFRICAN AMERICAN: 80 mL/min/{1.73_m2} (ref >59)
GFR, EST NON AFRICAN AMERICAN: 69 mL/min/{1.73_m2} (ref >59)
Glucose: 125 mg/dL — ABNORMAL HIGH (ref 65–99)
Potassium: 4.8 mmol/L (ref 3.5–5.2)
Sodium: 139 mmol/L (ref 134–144)

## 2015-08-14 DIAGNOSIS — Z800 Family history of malignant neoplasm of digestive organs: Secondary | ICD-10-CM | POA: Diagnosis not present

## 2015-08-14 DIAGNOSIS — Z86010 Personal history of colonic polyps: Secondary | ICD-10-CM | POA: Diagnosis not present

## 2015-09-02 ENCOUNTER — Encounter: Payer: Self-pay | Admitting: Family Medicine

## 2015-09-02 ENCOUNTER — Other Ambulatory Visit: Payer: Self-pay | Admitting: Family Medicine

## 2015-09-02 ENCOUNTER — Ambulatory Visit (INDEPENDENT_AMBULATORY_CARE_PROVIDER_SITE_OTHER): Payer: BLUE CROSS/BLUE SHIELD | Admitting: Family Medicine

## 2015-09-02 DIAGNOSIS — I10 Essential (primary) hypertension: Secondary | ICD-10-CM

## 2015-09-02 DIAGNOSIS — E785 Hyperlipidemia, unspecified: Secondary | ICD-10-CM | POA: Diagnosis not present

## 2015-09-02 DIAGNOSIS — K2100 Gastro-esophageal reflux disease with esophagitis, without bleeding: Secondary | ICD-10-CM

## 2015-09-02 DIAGNOSIS — E119 Type 2 diabetes mellitus without complications: Secondary | ICD-10-CM

## 2015-09-02 LAB — BAYER DCA HB A1C WAIVED: HB A1C (BAYER DCA - WAIVED): 7.1 % — ABNORMAL HIGH (ref <7.0)

## 2015-09-02 MED ORDER — RABEPRAZOLE SODIUM 20 MG PO TBEC: 20.0000 mg | DELAYED_RELEASE_TABLET | Freq: Every day | ORAL | 1 refills | Status: DC

## 2015-09-02 MED ORDER — LOSARTAN POTASSIUM 25 MG PO TABS: 25.0000 mg | ORAL_TABLET | Freq: Every day | ORAL | 1 refills | Status: DC

## 2015-09-02 MED ORDER — PRAVASTATIN SODIUM 40 MG PO TABS: 40.0000 mg | ORAL_TABLET | Freq: Every day | ORAL | 1 refills | Status: DC

## 2015-09-02 NOTE — Assessment & Plan Note (Signed)
A1c up to 7.1. Does not want to start medication. Continue to monitor closely- recheck 3 months at physical. Call with any concerns. Continue current regimen.

## 2015-09-02 NOTE — Assessment & Plan Note (Signed)
Under good control. Refills given today.

## 2015-09-02 NOTE — Assessment & Plan Note (Signed)
Lipemic. Being sent out. Continue to monitor closely. Call with any concerns. Continue current regimen.

## 2015-09-02 NOTE — Patient Instructions (Addendum)
DASH Eating Plan DASH stands for "Dietary Approaches to Stop Hypertension." The DASH eating plan is a healthy eating plan that has been shown to reduce high blood pressure (hypertension). Additional health benefits may include reducing the risk of type 2 diabetes mellitus, heart disease, and stroke. The DASH eating plan may also help with weight loss. WHAT DO I NEED TO KNOW ABOUT THE DASH EATING PLAN? For the DASH eating plan, you will follow these general guidelines:  Choose foods with a percent daily value for sodium of less than 5% (as listed on the food label).  Use salt-free seasonings or herbs instead of table salt or sea salt.  Check with your health care provider or pharmacist before using salt substitutes.  Eat lower-sodium products, often labeled as "lower sodium" or "no salt added."  Eat fresh foods.  Eat more vegetables, fruits, and low-fat dairy products.  Choose whole grains. Look for the word "whole" as the first word in the ingredient list.  Choose fish and skinless chicken or turkey more often than red meat. Limit fish, poultry, and meat to 6 oz (170 g) each day.  Limit sweets, desserts, sugars, and sugary drinks.  Choose heart-healthy fats.  Limit cheese to 1 oz (28 g) per day.  Eat more home-cooked food and less restaurant, buffet, and fast food.  Limit fried foods.  Cook foods using methods other than frying.  Limit canned vegetables. If you do use them, rinse them well to decrease the sodium.  When eating at a restaurant, ask that your food be prepared with less salt, or no salt if possible. WHAT FOODS CAN I EAT? Seek help from a dietitian for individual calorie needs. Grains Whole grain or whole wheat bread. Brown rice. Whole grain or whole wheat pasta. Quinoa, bulgur, and whole grain cereals. Low-sodium cereals. Corn or whole wheat flour tortillas. Whole grain cornbread. Whole grain crackers. Low-sodium crackers. Vegetables Fresh or frozen vegetables  (raw, steamed, roasted, or grilled). Low-sodium or reduced-sodium tomato and vegetable juices. Low-sodium or reduced-sodium tomato sauce and paste. Low-sodium or reduced-sodium canned vegetables.  Fruits All fresh, canned (in natural juice), or frozen fruits. Meat and Other Protein Products Ground beef (85% or leaner), grass-fed beef, or beef trimmed of fat. Skinless chicken or turkey. Ground chicken or turkey. Pork trimmed of fat. All fish and seafood. Eggs. Dried beans, peas, or lentils. Unsalted nuts and seeds. Unsalted canned beans. Dairy Low-fat dairy products, such as skim or 1% milk, 2% or reduced-fat cheeses, low-fat ricotta or cottage cheese, or plain low-fat yogurt. Low-sodium or reduced-sodium cheeses. Fats and Oils Tub margarines without trans fats. Light or reduced-fat mayonnaise and salad dressings (reduced sodium). Avocado. Safflower, olive, or canola oils. Natural peanut or almond butter. Other Unsalted popcorn and pretzels. The items listed above may not be a complete list of recommended foods or beverages. Contact your dietitian for more options. WHAT FOODS ARE NOT RECOMMENDED? Grains White bread. White pasta. White rice. Refined cornbread. Bagels and croissants. Crackers that contain trans fat. Vegetables Creamed or fried vegetables. Vegetables in a cheese sauce. Regular canned vegetables. Regular canned tomato sauce and paste. Regular tomato and vegetable juices. Fruits Dried fruits. Canned fruit in light or heavy syrup. Fruit juice. Meat and Other Protein Products Fatty cuts of meat. Ribs, chicken wings, bacon, sausage, bologna, salami, chitterlings, fatback, hot dogs, bratwurst, and packaged luncheon meats. Salted nuts and seeds. Canned beans with salt. Dairy Whole or 2% milk, cream, half-and-half, and cream cheese. Whole-fat or sweetened yogurt. Full-fat   cheeses or blue cheese. Nondairy creamers and whipped toppings. Processed cheese, cheese spreads, or cheese  curds. Condiments Onion and garlic salt, seasoned salt, table salt, and sea salt. Canned and packaged gravies. Worcestershire sauce. Tartar sauce. Barbecue sauce. Teriyaki sauce. Soy sauce, including reduced sodium. Steak sauce. Fish sauce. Oyster sauce. Cocktail sauce. Horseradish. Ketchup and mustard. Meat flavorings and tenderizers. Bouillon cubes. Hot sauce. Tabasco sauce. Marinades. Taco seasonings. Relishes. Fats and Oils Butter, stick margarine, lard, shortening, ghee, and bacon fat. Coconut, palm kernel, or palm oils. Regular salad dressings. Other Pickles and olives. Salted popcorn and pretzels. The items listed above may not be a complete list of foods and beverages to avoid. Contact your dietitian for more information. WHERE CAN I FIND MORE INFORMATION? National Heart, Lung, and Blood Institute: CableProm[employer].itwww.nhlbi.nih.gov/health/health-topics/topics/dash/   This information is not intended to replace advice given to you by your health care provider. Make sure you discuss any questions you have with your health care provider.   Document Released: 01/01/2011 Document Revised: 02/02/2014 Document Reviewed: 11/16/2012 Elsevier Interactive Patient Education 2016 Elsevier Inc.  Fat and Cholesterol Restricted Diet Getting too much fat and cholesterol in your diet may cause health problems. Following this diet helps keep your fat and cholesterol at normal levels. This can keep you from getting sick. WHAT TYPES OF FAT SHOULD I CHOOSE?  Choose monosaturated and polyunsaturated fats. These are found in foods such as olive oil, canola oil, flaxseeds, walnuts, almonds, and seeds.  Eat more omega-3 fats. Good choices include salmon, mackerel, sardines, tuna, flaxseed oil, and ground flaxseeds.  Limit saturated fats. These are in animal products such as meats, butter, and cream. They can also be in plant products such as palm oil, palm kernel oil, and coconut oil.   Avoid foods with partially  hydrogenated oils in them. These contain trans fats. Examples of foods that have trans fats are stick margarine, some tub margarines, cookies, crackers, and other baked goods. WHAT GENERAL GUIDELINES DO I NEED TO FOLLOW?   Check food labels. Look for the words "trans fat" and "saturated fat."  When preparing a meal:  Fill half of your plate with vegetables and green salads.  Fill one fourth of your plate with whole grains. Look for the word "whole" as the first word in the ingredient list.  Fill one fourth of your plate with lean protein foods.  Limit fruit to two servings a day. Choose fruit instead of juice.  Eat more foods with soluble fiber. Examples of foods with this type of fiber are apples, broccoli, carrots, beans, peas, and barley. Try to get 20-30 g (grams) of fiber per day.  Eat more home-cooked foods. Eat less at restaurants and buffets.  Limit or avoid alcohol.  Limit foods high in starch and sugar.  Limit fried foods.  Cook foods without frying them. Baking, boiling, grilling, and broiling are all great options.  Lose weight if you are overweight. Losing even a small amount of weight can help your overall health. It can also help prevent diseases such as diabetes and heart disease. WHAT FOODS CAN I EAT? Grains Whole grains, such as whole wheat or whole grain breads, crackers, cereals, and pasta. Unsweetened oatmeal, bulgur, barley, quinoa, or brown rice. Corn or whole wheat flour tortillas. Vegetables Fresh or frozen vegetables (raw, steamed, roasted, or grilled). Green salads. Fruits All fresh, canned (in natural juice), or frozen fruits. Meat and Other Protein Products Ground beef (85% or leaner), grass-fed beef, or beef trimmed of fat.  Skinless chicken or [place]. Ground chicken or [place]. Pork trimmed of fat. All fish and seafood. Eggs. Dried beans, peas, or lentils. Unsalted nuts or seeds. Unsalted canned or dry beans. Dairy Low-fat dairy products, such as  skim or 1% milk, 2% or reduced-fat cheeses, low-fat ricotta or cottage cheese, or plain low-fat yogurt. Fats and Oils Tub margarines without trans fats. Light or reduced-fat mayonnaise and salad dressings. Avocado. Olive, canola, sesame, or safflower oils. Natural peanut or almond butter (choose ones without added sugar and oil). The items listed above may not be a complete list of recommended foods or beverages. Contact your dietitian for more options. WHAT FOODS ARE NOT RECOMMENDED? Grains White bread. White pasta. White rice. Cornbread. Bagels, pastries, and croissants. Crackers that contain trans fat. Vegetables White potatoes. Corn. Creamed or fried vegetables. Vegetables in a cheese sauce. Fruits Dried fruits. Canned fruit in light or heavy syrup. Fruit juice. Meat and Other Protein Products Fatty cuts of meat. Ribs, chicken wings, bacon, sausage, bologna, salami, chitterlings, fatback, hot dogs, bratwurst, and packaged luncheon meats. Liver and organ meats. Dairy Whole or 2% milk, cream, half-and-half, and cream cheese. Whole milk cheeses. Whole-fat or sweetened yogurt. Full-fat cheeses. Nondairy creamers and whipped toppings. Processed cheese, cheese spreads, or cheese curds. Sweets and Desserts Corn syrup, sugars, honey, and molasses. Candy. Jam and jelly. Syrup. Sweetened cereals. Cookies, pies, cakes, donuts, muffins, and ice cream. Fats and Oils Butter, stick margarine, lard, shortening, ghee, or bacon fat. Coconut, palm kernel, or palm oils. Beverages Alcohol. Sweetened drinks (such as sodas, lemonade, and fruit drinks or punches). The items listed above may not be a complete list of foods and beverages to avoid. Contact your dietitian for more information.   This information is not intended to replace advice given to you by your health care provider. Make sure you discuss any questions you have with your health care provider.   Document Released: 07/14/2011 Document Revised:  02/02/2014 Document Reviewed: 04/13/2013 Elsevier Interactive Patient Education [employer]

## 2015-09-02 NOTE — Assessment & Plan Note (Signed)
Elevated here, but good at home. Continue to monitor closely. Call with any concerns. Continue current regimen.

## 2015-09-02 NOTE — Progress Notes (Signed)
BP (!) 145/82   Pulse 65   Temp 98.2 F (36.8 C)   Ht 5' 11.3" (1.811 m)   Wt 212 lb (96.2 kg)   SpO2 98%   BMI 29.32 kg/m    Subjective:    Patient ID: Stephen Mayo, male    DOB: Mar 28, 1970, 45 y.o.   MRN: [id number]  HPI: Stephen Mayo is a 45 y.o. male  Chief Complaint  Patient presents with  . Hyperlipidemia  . Hypertension  . Diabetes   HYPERTENSION / HYPERLIPIDEMIA Satisfied with current treatment? yes Duration of hypertension: chronic BP monitoring frequency: couple of times a week- 130s/70s BP medication side effects: no Duration of hyperlipidemia: chronic Cholesterol medication side effects: no Cholesterol supplements: none Medication compliance: excellent compliance Aspirin: no Recent stressors: no Recurrent headaches: no Visual changes: no Palpitations: no Dyspnea: no Chest pain: no Lower extremity edema: no Dizzy/lightheaded: no  DIABETES- A1c 7.1 Hypoglycemic episodes:no Polydipsia/polyuria: no Visual disturbance: no Chest pain: no Paresthesias: no Glucose Monitoring: yes  Accucheck frequency: daily- 140s-low 200s Taking Insulin?: no Blood Pressure Monitoring: not checking Retinal Examination: Up to Date Foot Exam: Up to Date Diabetic Education: Completed Pneumovax: Up to Date Influenza: Up to Date Aspirin: no   Relevant past medical, surgical, family and social history reviewed and updated as indicated. Interim medical history since our last visit reviewed. Allergies and medications reviewed and updated.  Review of Systems  Constitutional: Negative.   Respiratory: Negative.   Cardiovascular: Negative.   Psychiatric/Behavioral: Negative.     Per HPI unless specifically indicated above     Objective:    BP (!) 145/82   Pulse 65   Temp 98.2 F (36.8 C)   Ht 5' 11.3" (1.811 m)   Wt 212 lb (96.2 kg)   SpO2 98%   BMI 29.32 kg/m   Wt Readings from Last 3 Encounters:  09/02/15 212 lb (96.2 kg)  05/30/15 212 lb (96.2  kg)  03/01/15 223 lb (101.2 kg)    Physical Exam  Constitutional: He is oriented to person, place, and time. He appears well-developed and well-nourished. No distress.  HENT:  Head: Normocephalic and atraumatic.  Right Ear: Hearing normal.  Left Ear: Hearing normal.  Nose: Nose normal.  Eyes: Conjunctivae and lids are normal. Right eye exhibits no discharge. Left eye exhibits no discharge. No scleral icterus.  Cardiovascular: Normal rate, regular rhythm, normal heart sounds and intact distal pulses.  Exam reveals no gallop and no friction rub.   No murmur heard. Pulmonary/Chest: Breath sounds normal. No respiratory distress. He has no wheezes. He has no rales. He exhibits no tenderness.  Musculoskeletal: Normal range of motion.  Neurological: He is alert and oriented to person, place, and time.  Skin: Skin is warm, dry and intact. No rash noted. No erythema. No pallor.  Psychiatric: He has a normal mood and affect. His speech is normal and behavior is normal. Judgment and thought content normal. Cognition and memory are normal.  Nursing note and vitals reviewed.   Results for orders placed or performed in visit on 05/30/15  Bayer DCA Hb A1c Waived  Result Value Ref Range   Bayer DCA Hb A1c Waived 6.8 <7.0 %  Basic metabolic panel  Result Value Ref Range   Glucose 125 (H) 65 - 99 mg/dL   BUN 13 6 - 24 mg/dL   Creatinine, Ser 4.09 0.76 - 1.27 mg/dL   GFR calc non Af Amer 69 >59 mL/min/1.73   GFR calc Af  Amer 80 >59 mL/min/1.73   BUN/Creatinine Ratio 10 9 - 20   Sodium 139 134 - 144 mmol/L   Potassium 4.8 3.5 - 5.2 mmol/L   Chloride 97 96 - 106 mmol/L   CO2 26 18 - 29 mmol/L   Calcium 9.7 8.7 - 10.2 mg/dL      Assessment & Plan:   Problem List Items Addressed This Visit      Cardiovascular and Mediastinum   HTN (hypertension)    Elevated here, but good at home. Continue to monitor closely. Call with any concerns. Continue current regimen.       Relevant Medications    pravastatin (PRAVACHOL) 40 MG tablet   losartan (COZAAR) 25 MG tablet     Digestive   Gastroesophageal reflux disease with esophagitis    Under good control. Refills given today.        Endocrine   Diabetes mellitus type 2, diet-controlled (HCC)    A1c up to 7.1. Does not want to start medication. Continue to monitor closely- recheck 3 months at physical. Call with any concerns. Continue current regimen.       Relevant Medications   pravastatin (PRAVACHOL) 40 MG tablet   losartan (COZAAR) 25 MG tablet     Other   HLD (hyperlipidemia)    Lipemic. Being sent out. Continue to monitor closely. Call with any concerns. Continue current regimen.       Relevant Medications   pravastatin (PRAVACHOL) 40 MG tablet   losartan (COZAAR) 25 MG tablet    Other Visit Diagnoses   None.      Follow up plan: Return in about 3 months (around 12/03/2015) for Physical.

## 2015-09-03 ENCOUNTER — Encounter: Payer: Self-pay | Admitting: Family Medicine

## 2015-09-03 LAB — LIPID PANEL W/O CHOL/HDL RATIO
CHOLESTEROL TOTAL: 164 mg/dL (ref 100–199)
HDL: 32 mg/dL — ABNORMAL LOW (ref >39)
TRIGLYCERIDES: 446 mg/dL — AB (ref 0–149)

## 2015-09-03 LAB — COMPREHENSIVE METABOLIC PANEL
ALBUMIN: 4.1 g/dL (ref 3.5–5.5)
ALK PHOS: 142 IU/L — AB (ref 39–117)
ALT: 31 IU/L (ref 0–44)
AST: 26 IU/L (ref 0–40)
Albumin/Globulin Ratio: 1.6 (ref 1.2–2.2)
BILIRUBIN TOTAL: 0.4 mg/dL (ref 0.0–1.2)
BUN / CREAT RATIO: 9 (ref 9–20)
BUN: 8 mg/dL (ref 6–24)
CHLORIDE: 97 mmol/L (ref 96–106)
CO2: 27 mmol/L (ref 18–29)
Calcium: 9.2 mg/dL (ref 8.7–10.2)
Creatinine, Ser: 0.9 mg/dL (ref 0.76–1.27)
GFR calc Af Amer: 121 mL/min/{1.73_m2} (ref >59)
GFR calc non Af Amer: 104 mL/min/{1.73_m2} (ref >59)
GLOBULIN, TOTAL: 2.6 g/dL (ref 1.5–4.5)
GLUCOSE: 160 mg/dL — AB (ref 65–99)
POTASSIUM: 4.5 mmol/L (ref 3.5–5.2)
SODIUM: 139 mmol/L (ref 134–144)
Total Protein: 6.7 g/dL (ref 6.0–8.5)

## 2015-09-03 LAB — SPECIMEN STATUS REPORT

## 2015-09-04 ENCOUNTER — Telehealth: Payer: Self-pay | Admitting: Family Medicine

## 2015-09-04 NOTE — Telephone Encounter (Signed)
Called patient to give him his results of his labs. Will start OTC fish oil. Call with any concerns.

## 2015-10-24 ENCOUNTER — Encounter: Payer: Self-pay | Admitting: *Deleted

## 2015-10-25 ENCOUNTER — Ambulatory Visit: Payer: BLUE CROSS/BLUE SHIELD | Admitting: Anesthesiology

## 2015-10-25 ENCOUNTER — Ambulatory Visit
Admission: RE | Admit: 2015-10-25 | Discharge: 2015-10-25 | Disposition: A | Discharge status: Home / Self Care | Payer: BLUE CROSS/BLUE SHIELD | Source: Ambulatory Visit | Attending: Unknown Physician Specialty | Admitting: Unknown Physician Specialty

## 2015-10-25 ENCOUNTER — Encounter
Admission: RE | Disposition: A | Discharge status: Home / Self Care | Payer: Self-pay | Source: Ambulatory Visit | Attending: Unknown Physician Specialty

## 2015-10-25 ENCOUNTER — Encounter: Payer: Self-pay | Admitting: *Deleted

## 2015-10-25 DIAGNOSIS — K219 Gastro-esophageal reflux disease without esophagitis: Secondary | ICD-10-CM | POA: Diagnosis not present

## 2015-10-25 DIAGNOSIS — Z1211 Encounter for screening for malignant neoplasm of colon: Secondary | ICD-10-CM | POA: Diagnosis not present

## 2015-10-25 DIAGNOSIS — E119 Type 2 diabetes mellitus without complications: Secondary | ICD-10-CM | POA: Diagnosis not present

## 2015-10-25 DIAGNOSIS — E785 Hyperlipidemia, unspecified: Secondary | ICD-10-CM | POA: Diagnosis not present

## 2015-10-25 DIAGNOSIS — Z800 Family history of malignant neoplasm of digestive organs: Secondary | ICD-10-CM | POA: Insufficient documentation

## 2015-10-25 DIAGNOSIS — K5730 Diverticulosis of large intestine without perforation or abscess without bleeding: Secondary | ICD-10-CM | POA: Insufficient documentation

## 2015-10-25 DIAGNOSIS — I10 Essential (primary) hypertension: Secondary | ICD-10-CM | POA: Diagnosis not present

## 2015-10-25 DIAGNOSIS — K644 Residual hemorrhoidal skin tags: Secondary | ICD-10-CM | POA: Diagnosis not present

## 2015-10-25 DIAGNOSIS — Z87891 Personal history of nicotine dependence: Secondary | ICD-10-CM | POA: Insufficient documentation

## 2015-10-25 DIAGNOSIS — Z8371 Family history of colonic polyps: Secondary | ICD-10-CM | POA: Insufficient documentation

## 2015-10-25 DIAGNOSIS — K648 Other hemorrhoids: Secondary | ICD-10-CM | POA: Insufficient documentation

## 2015-10-25 DIAGNOSIS — Z86010 Personal history of colonic polyps: Secondary | ICD-10-CM | POA: Insufficient documentation

## 2015-10-25 DIAGNOSIS — K640 First degree hemorrhoids: Secondary | ICD-10-CM | POA: Diagnosis not present

## 2015-10-25 HISTORY — DX: Essential (primary) hypertension: I10

## 2015-10-25 HISTORY — PX: COLONOSCOPY WITH PROPOFOL: SHX5780

## 2015-10-25 HISTORY — DX: Type 2 diabetes mellitus without complications: E11.9

## 2015-10-25 LAB — GLUCOSE, CAPILLARY: GLUCOSE-CAPILLARY: 111 mg/dL — AB (ref 65–99)

## 2015-10-25 LAB — HM COLONOSCOPY

## 2015-10-25 SURGERY — COLONOSCOPY WITH PROPOFOL
Anesthesia: General

## 2015-10-25 MED ORDER — MIDAZOLAM HCL 2 MG/2ML IJ SOLN
INTRAMUSCULAR | Status: DC | PRN
  Administered 2015-10-25: 2 mg via INTRAVENOUS

## 2015-10-25 MED ORDER — PROPOFOL 10 MG/ML IV BOLUS
INTRAVENOUS | Status: DC | PRN
  Administered 2015-10-25: 150 mg via INTRAVENOUS

## 2015-10-25 MED ORDER — SODIUM CHLORIDE 0.9 % IV SOLN
INTRAVENOUS | Status: DC
  Administered 2015-10-25: 07:00:00 via INTRAVENOUS

## 2015-10-25 MED ORDER — FENTANYL CITRATE (PF) 100 MCG/2ML IJ SOLN
INTRAMUSCULAR | Status: DC | PRN
  Administered 2015-10-25: 50 ug via INTRAVENOUS

## 2015-10-25 MED ORDER — SODIUM CHLORIDE 0.9 % IV SOLN: INTRAVENOUS | Status: DC

## 2015-10-25 MED ORDER — PROPOFOL 500 MG/50ML IV EMUL
INTRAVENOUS | Status: DC | PRN
  Administered 2015-10-25: 150 ug/kg/min via INTRAVENOUS

## 2015-10-25 NOTE — Op Note (Signed)
[facility] Patient Name: Stephen Mayo Procedure Date: 10/25/2015 7:45 AM MRN: [id number] Account #: [id number] Date of Birth: 05-25-1970 Admit Type: Outpatient Age: 45 Room: [facility] ENDO ROOM 1 Gender: Male Note Status: Finalized Procedure:            Colonoscopy Indications:          High risk colon cancer surveillance: Personal history                        of colonic polyps Providers:            Scot Jun, MD Referring MD:         Dorcas Carrow (Referring MD) Medicines:            Propofol per Anesthesia Complications:        No immediate complications. Procedure:            Pre-Anesthesia Assessment:                       - After reviewing the risks and benefits, the patient                        was deemed in satisfactory condition to undergo the                        procedure.                       After obtaining informed consent, the colonoscope was                        passed under direct vision. Throughout the procedure,                        the patient's blood pressure, pulse, and oxygen                        saturations were monitored continuously. The                        Colonoscope was introduced through the anus and                        advanced to the the cecum, identified by appendiceal                        orifice and ileocecal valve. The colonoscopy was                        performed without difficulty. The patient tolerated the                        procedure well. The quality of the bowel preparation                        was excellent. Findings:      Many small-mouthed diverticula were found in the sigmoid colon and       descending colon.      Internal hemorrhoids were found during endoscopy. The hemorrhoids were       small and Grade I (internal hemorrhoids that do not prolapse).  External hemorrhoids were found during perianal exam. The hemorrhoids       were small.      The exam was  otherwise without abnormality. Impression:           - Diverticulosis in the sigmoid colon and in the                        descending colon.                       - Internal hemorrhoids.                       - External hemorrhoids.                       - The examination was otherwise normal.                       - No specimens collected. Recommendation:       - Repeat colonoscopy in 5 years for surveillance. Scot Junobert T Manreet Kiernan, MD 10/25/2015 8:14:59 AM This report has been signed electronically. Number of Addenda: 0 Note Initiated On: 10/25/2015 7:45 AM Scope Withdrawal Time: 0 hours 11 minutes 3 seconds  Total Procedure Duration: 0 hours 16 minutes 27 seconds       [facility]

## 2015-10-25 NOTE — H&P (Signed)
Primary Care Physician:  Olevia Perches, DO Primary Gastroenterologist:  Dr. Mechele Collin   Pre-Procedure History & Physical: HPI:  Stephen Mayo is a 45 y.o. male is here for an colonoscopy.   Past Medical History:  Diagnosis Date  . Allergy   . Back pain   . Diabetes mellitus without complication (HCC)   . GERD (gastroesophageal reflux disease)   . Hyperlipidemia   . Hypertension     Past Surgical History:  Procedure Laterality Date  . HAND SURGERY Right 2012   fracture- 5 pins and reconstruction  . HERNIA REPAIR  2002   Double  . NASAL SINUS SURGERY  2008   Maxillary Sinus Removed  . STERIOD INJECTION    . ulcer on his larynx      Prior to Admission medications   Medication Sig Start Date End Date Taking? Authorizing Provider  flunisolide (NASALIDE) 25 MCG/ACT (0.025%) SOLN Place 2 sprays into the nose daily as needed. 08/27/14  Yes Megan P Johnson, DO  losartan (COZAAR) 25 MG tablet Take 1 tablet (25 mg total) by mouth daily. 09/02/15  Yes Megan P Johnson, DO  RABEprazole (ACIPHEX) 20 MG tablet Take 1 tablet (20 mg total) by mouth daily. 09/02/15  Yes Megan P Johnson, DO  pravastatin (PRAVACHOL) 40 MG tablet Take 1 tablet (40 mg total) by mouth daily. 09/02/15   Megan P Johnson, DO    Allergies as of 09/06/2015 - Review Complete 09/02/2015  Allergen Reaction Noted  . Lisinopril Cough 05/30/2015    Family History  Problem Relation Age of Onset  . Cancer Mother     Lung  . Cancer Father     colon  . Cancer Maternal Grandmother     breast, brain and colon  . Lymphoma Paternal Grandmother   . Cancer Paternal Grandfather     lung and throat    Social History   Social History  . Marital status: Married    Spouse name: N/A  . Number of children: N/A  . Years of education: N/A   Occupational History  . Not on file.   Social History Main Topics  . Smoking status: Former Smoker    Quit date: 06/25/2005  . Smokeless tobacco: Former Neurosurgeon  . Alcohol use 7.2 oz/week     10 Cans of beer, 2 Shots of liquor per week  . Drug use: No  . Sexual activity: Yes   Other Topics Concern  . Not on file   Social History Narrative  . No narrative on file    Review of Systems: See HPI, otherwise negative ROS  Physical Exam: BP (!) 158/97   Pulse (!) 54   Temp 97.7 F (36.5 C) (Tympanic)   Resp 14   Ht 5\' 11"  (1.803 m)   Wt 90.7 kg (200 lb)   SpO2 100%   BMI 27.89 kg/m  General:   Alert,  pleasant and cooperative in NAD Head:  Normocephalic and atraumatic. Neck:  Supple; no masses or thyromegaly. Lungs:  Clear throughout to auscultation.    Heart:  Regular rate and rhythm. Abdomen:  Soft, nontender and nondistended. Normal bowel sounds, without guarding, and without rebound.   Neurologic:  Alert and  oriented x4;  grossly normal neurologically.  Impression/Plan: Stephen Mayo is here for an colonoscopy to be performed for [facility] colon polyps and family history of colon cancer in father.  Risks, benefits, limitations, and alternatives regarding  colonoscopy have been reviewed with the patient.  Questions have  been answered.  All parties agreeable.   Lynnae PrudeELLIOTT, ROBERT, MD  10/25/2015, 7:30 AM

## 2015-10-25 NOTE — Transfer of Care (Signed)
Immediate Anesthesia Transfer of Care Note  Patient: Stephen Mayo  Procedure(s) Performed: Procedure(s): COLONOSCOPY WITH PROPOFOL (N/A)  Patient Location: Endoscopy Unit  Anesthesia Type:General  Level of Consciousness: awake, alert  and oriented  Airway & Oxygen Therapy: Patient Spontanous Breathing  Post-op Assessment: Report given to RN and Post -op Vital signs reviewed and stable  Post vital signs: Reviewed and stable  Last Vitals:  Vitals:   10/25/15 0706 10/25/15 0816  BP: (!) 158/97 121/76  Pulse: (!) 54 64  Resp: 14 15  Temp: 36.5 C 36.7 C    Last Pain:  Vitals:   10/25/15 0816  TempSrc: Tympanic         Complications: No apparent complications

## 2015-10-25 NOTE — Addendum Note (Signed)
Addendum  created 10/25/15 1225 by Lenard SimmerAndrew Iza Preston, MD   Anesthesia Event edited, Anesthesia Staff edited

## 2015-10-25 NOTE — Anesthesia Postprocedure Evaluation (Signed)
Anesthesia Post Note  Patient: Stephen Mayo  Procedure(s) Performed: Procedure(s) (LRB): COLONOSCOPY WITH PROPOFOL (N/A)  Patient location during evaluation: Endoscopy Anesthesia Type: General Level of consciousness: awake and alert Pain management: pain level controlled Vital Signs Assessment: post-procedure vital signs reviewed and stable Respiratory status: spontaneous breathing, nonlabored ventilation, respiratory function stable and patient connected to nasal cannula oxygen Cardiovascular status: blood pressure returned to baseline and stable Postop Assessment: no signs of nausea or vomiting Anesthetic complications: no    Last Vitals:  Vitals:   10/25/15 0836 10/25/15 0846  BP: (!) 147/84 (!) 159/87  Pulse: (!) 44 (!) 46  Resp: 18 17  Temp:      Last Pain:  Vitals:   10/25/15 0816  TempSrc: Tympanic                 Lenard SimmerAndrew Jametta Moore[place]

## 2015-10-25 NOTE — Anesthesia Preprocedure Evaluation (Signed)
Anesthesia Evaluation  Patient identified by MRN, date of birth, ID band Patient awake    Reviewed: Allergy & Precautions, H&P , NPO status , Patient's Chart, lab work & pertinent test results, reviewed documented beta blocker date and time   History of Anesthesia Complications Negative for: history of anesthetic complications  Airway Mallampati: I  TM Distance: >3 FB Neck ROM: full    Dental no notable dental hx. (+) Caps, Missing, Poor Dentition   Pulmonary neg pulmonary ROS, former smoker,    Pulmonary exam normal breath sounds clear to auscultation       Cardiovascular Exercise Tolerance: Good hypertension, On Medications (-) angina(-) CAD, (-) Past MI, (-) Cardiac Stents and (-) CABG Normal cardiovascular exam(-) dysrhythmias (-) Valvular Problems/Murmurs Rhythm:regular Rate:Normal     Neuro/Psych negative neurological ROS  negative psych ROS   GI/Hepatic Neg liver ROS, GERD  ,  Endo/Other  diabetes (diet controlled), Well Controlled  Renal/GU negative Renal ROS  negative genitourinary   Musculoskeletal   Abdominal   Peds  Hematology negative hematology ROS (+)   Anesthesia Other Findings Past Medical History: No date: Allergy No date: Back pain No date: Diabetes mellitus without complication (HCC) No date: GERD (gastroesophageal reflux disease) No date: Hyperlipidemia No date: Hypertension   Reproductive/Obstetrics negative OB ROS                             Anesthesia Physical Anesthesia Plan  ASA: II  Anesthesia Plan: General   Post-op Pain Management:    Induction:   Airway Management Planned:   Additional Equipment:   Intra-op Plan:   Post-operative Plan:   Informed Consent: I have reviewed the patients History and Physical, chart, labs and discussed the procedure including the risks, benefits and alternatives for the proposed anesthesia with the patient or  authorized representative who has indicated his/her understanding and acceptance.   Dental Advisory Given  Plan Discussed with: Anesthesiologist, CRNA and Surgeon  Anesthesia Plan Comments:         Anesthesia Quick Evaluation

## 2015-10-28 ENCOUNTER — Encounter: Payer: Self-pay | Admitting: Unknown Physician Specialty

## 2015-11-15 ENCOUNTER — Encounter (INDEPENDENT_AMBULATORY_CARE_PROVIDER_SITE_OTHER): Payer: Self-pay

## 2015-12-03 ENCOUNTER — Encounter: Payer: Self-pay | Admitting: Family Medicine

## 2015-12-03 ENCOUNTER — Ambulatory Visit (INDEPENDENT_AMBULATORY_CARE_PROVIDER_SITE_OTHER): Payer: BLUE CROSS/BLUE SHIELD | Admitting: Family Medicine

## 2015-12-03 VITALS — BP 138/74 | HR 67 | Temp 98.7°F | Ht 70.4 in | Wt 198.3 lb

## 2015-12-03 DIAGNOSIS — E119 Type 2 diabetes mellitus without complications: Secondary | ICD-10-CM

## 2015-12-03 DIAGNOSIS — E782 Mixed hyperlipidemia: Secondary | ICD-10-CM | POA: Diagnosis not present

## 2015-12-03 DIAGNOSIS — K2100 Gastro-esophageal reflux disease with esophagitis, without bleeding: Secondary | ICD-10-CM

## 2015-12-03 DIAGNOSIS — I10 Essential (primary) hypertension: Secondary | ICD-10-CM

## 2015-12-03 DIAGNOSIS — Z0000 Encounter for general adult medical examination without abnormal findings: Secondary | ICD-10-CM | POA: Diagnosis not present

## 2015-12-03 DIAGNOSIS — Z23 Encounter for immunization: Secondary | ICD-10-CM | POA: Diagnosis not present

## 2015-12-03 LAB — UA/M W/RFLX CULTURE, ROUTINE
Bilirubin, UA: NEGATIVE
Glucose, UA: NEGATIVE
KETONES UA: NEGATIVE
LEUKOCYTES UA: NEGATIVE
Nitrite, UA: NEGATIVE
PROTEIN UA: NEGATIVE
RBC UA: NEGATIVE
SPEC GRAV UA: 1.02 (ref 1.005–1.030)
Urobilinogen, Ur: 0.2 mg/dL (ref 0.2–1.0)
pH, UA: 5.5 (ref 5.0–7.5)

## 2015-12-03 LAB — BAYER DCA HB A1C WAIVED: HB A1C (BAYER DCA - WAIVED): 6.1 % (ref <7.0)

## 2015-12-03 LAB — MICROALBUMIN, URINE WAIVED
Creatinine, Urine Waived: 200 mg/dL (ref 10–300)
MICROALB, UR WAIVED: 10 mg/L (ref 0–19)

## 2015-12-03 LAB — LIPID PANEL PICCOLO, WAIVED
Chol/HDL Ratio Piccolo,Waive: 2.4 mg/dL
Cholesterol Piccolo, Waived: 167 mg/dL (ref <200)
HDL Chol Piccolo, Waived: 69 mg/dL (ref >59)
LDL Chol Calc Piccolo Waived: 70 mg/dL (ref <100)
Triglycerides Piccolo,Waived: 135 mg/dL (ref <150)
VLDL Chol Calc Piccolo,Waive: 27 mg/dL (ref <30)

## 2015-12-03 MED ORDER — LOSARTAN POTASSIUM 25 MG PO TABS: 25.0000 mg | ORAL_TABLET | Freq: Every day | ORAL | 1 refills | Status: DC

## 2015-12-03 MED ORDER — RABEPRAZOLE SODIUM 20 MG PO TBEC: 20.0000 mg | DELAYED_RELEASE_TABLET | Freq: Every day | ORAL | 1 refills | Status: DC

## 2015-12-03 MED ORDER — PRAVASTATIN SODIUM 40 MG PO TABS: 40.0000 mg | ORAL_TABLET | Freq: Every day | ORAL | 1 refills | Status: DC

## 2015-12-03 NOTE — Assessment & Plan Note (Signed)
Under good control. Continue current regimen. Continue to monitor.  

## 2015-12-03 NOTE — Assessment & Plan Note (Signed)
Better on recheck. Continue current regimen. Recheck 6 month.

## 2015-12-03 NOTE — Progress Notes (Signed)
BP 138/74   Pulse 67   Temp 98.7 F (37.1 C)   Ht 5' 10.4" (1.788 m)   Wt 198 lb 4.8 oz (89.9 kg)   SpO2 99%   BMI 28.13 kg/m    Subjective:    Patient ID: Stephen Mayo, male    DOB: 05-11-1970, 45 y.o.   MRN: [id number]  HPI: Stephen Mayo is a 45 y.o. male presenting on 12/03/2015 for comprehensive medical examination. Current medical complaints include:  DIABETES Hypoglycemic episodes:no Polydipsia/polyuria: no Visual disturbance: no Chest pain: no Paresthesias: no Glucose Monitoring: yes  Accucheck frequency: Daily Taking Insulin?: no Blood Pressure Monitoring: not checking Retinal Examination: Up to Date Foot Exam: Up to Date Diabetic Education: Completed Pneumovax: Up to Date Influenza: Up to Date Aspirin: no  HYPERTENSION / HYPERLIPIDEMIA Satisfied with current treatment? no Duration of hypertension: chronic BP monitoring frequency: not checking BP medication side effects: no Duration of hyperlipidemia: years Cholesterol medication side effects: no Cholesterol supplements: fish oil Medication compliance: excellent compliance Aspirin: no Recent stressors: no Recurrent headaches: no Visual changes: no Palpitations: no Dyspnea: no Chest pain: no Lower extremity edema: no Dizzy/lightheaded: no  He currently lives with: wife Interim Problems from his last visit: no  Depression Screen done today and results listed below:  Depression screen [facility] 2/9 12/03/2015 05/30/2015 11/29/2014  Decreased Interest 0 0 0  Down, Depressed, Hopeless 0 0 0  PHQ - 2 Score 0 0 0    Past Medical History:  Past Medical History:  Diagnosis Date  . Allergy   . Back pain   . Diabetes mellitus without complication (HCC)   . GERD (gastroesophageal reflux disease)   . Hyperlipidemia   . Hypertension     Surgical History:  Past Surgical History:  Procedure Laterality Date  . COLONOSCOPY WITH PROPOFOL N/A 10/25/2015   Procedure: COLONOSCOPY WITH PROPOFOL;  Surgeon:  Scot Jun, MD;  Location: [facility] ENDOSCOPY;  Service: Endoscopy;  Laterality: N/A;  . HAND SURGERY Right 2012   fracture- 5 pins and reconstruction  . HERNIA REPAIR  2002   Double  . NASAL SINUS SURGERY  2008   Maxillary Sinus Removed  . STERIOD INJECTION    . ulcer on his larynx      Medications:  No current outpatient prescriptions on file prior to visit.   No current facility-administered medications on file prior to visit.     Allergies:  Allergies  Allergen Reactions  . Lisinopril Cough    Social History:  Social History   Social History  . Marital status: Married    Spouse name: N/A  . Number of children: N/A  . Years of education: N/A   Occupational History  . Not on file.   Social History Main Topics  . Smoking status: Former Smoker    Quit date: 06/25/2005  . Smokeless tobacco: Former Neurosurgeon  . Alcohol use 7.2 oz/week    10 Cans of beer, 2 Shots of liquor per week  . Drug use: No  . Sexual activity: Yes   Other Topics Concern  . Not on file   Social History Narrative  . No narrative on file   History  Smoking Status  . Former Smoker  . Quit date: 06/25/2005  Smokeless Tobacco  . Former User   History  Alcohol Use  . 7.2 oz/week  . 10 Cans of beer, 2 Shots of liquor per week    Family History:  Family History  Problem Relation Age  of Onset  . Cancer Mother     Lung  . Cancer Father     colon  . Cancer Maternal Grandmother     breast, brain and colon  . Lymphoma Paternal Grandmother   . Cancer Paternal Grandfather     lung and throat   Past medical history, surgical history, medications, allergies, family history and social history reviewed with patient today and changes made to appropriate areas of the chart.   Review of Systems  Constitutional: Negative.   HENT: Negative.   Eyes: Negative.   Respiratory: Negative.   Cardiovascular: Negative.   Gastrointestinal: Negative.   Genitourinary: Negative.   Musculoskeletal:  Negative.   Skin: Negative.   Neurological: Negative.   Endo/Heme/Allergies: Negative for environmental allergies and polydipsia. Bruises/bleeds easily.  Psychiatric/Behavioral: Negative.     All other ROS negative except what is listed above and in the HPI.      Objective:    BP 138/74   Pulse 67   Temp 98.7 F (37.1 C)   Ht 5' 10.4" (1.788 m)   Wt 198 lb 4.8 oz (89.9 kg)   SpO2 99%   BMI 28.13 kg/m   Wt Readings from Last 3 Encounters:  12/03/15 198 lb 4.8 oz (89.9 kg)  10/25/15 200 lb (90.7 kg)  09/02/15 212 lb (96.2 kg)    Physical Exam  Constitutional: He is oriented to person, place, and time. He appears well-developed and well-nourished. No distress.  HENT:  Head: Normocephalic and atraumatic.  Right Ear: Hearing, tympanic membrane, external ear and ear canal normal.  Left Ear: Hearing, tympanic membrane, external ear and ear canal normal.  Nose: Nose normal.  Mouth/Throat: Uvula is midline, oropharynx is clear and moist and mucous membranes are normal. No oropharyngeal exudate.  Eyes: Conjunctivae, EOM and lids are normal. Pupils are equal, round, and reactive to light. Right eye exhibits no discharge. Left eye exhibits no discharge. No scleral icterus.  Neck: Normal range of motion. Neck supple. No JVD present. No tracheal deviation present. No thyromegaly present.  Cardiovascular: Normal rate, regular rhythm, normal heart sounds and intact distal pulses.  Exam reveals no gallop and no friction rub.   No murmur heard. Pulmonary/Chest: Effort normal and breath sounds normal. No stridor. No respiratory distress. He has no wheezes. He has no rales. He exhibits no tenderness.  Abdominal: Soft. Bowel sounds are normal. He exhibits no distension and no mass. There is no tenderness. There is no rebound and no guarding. Hernia confirmed negative in the right inguinal area and confirmed negative in the left inguinal area.  Genitourinary: Testes normal and penis normal.  Cremasteric reflex is present. Right testis shows no mass, no swelling and no tenderness. Right testis is descended. Cremasteric reflex is not absent on the right side. Left testis shows no mass, no swelling and no tenderness. Left testis is descended. Cremasteric reflex is not absent on the left side. Circumcised. No penile tenderness.  Musculoskeletal: Normal range of motion. He exhibits no edema, tenderness or deformity.  Lymphadenopathy:    He has no cervical adenopathy.  Neurological: He is alert and oriented to person, place, and time. He has normal reflexes. He displays normal reflexes. No cranial nerve deficit. He exhibits normal muscle tone. Coordination normal.  Skin: Skin is warm, dry and intact. No rash noted. He is not diaphoretic. No erythema. No pallor.  Psychiatric: He has a normal mood and affect. His speech is normal and behavior is normal. Judgment and thought content normal.  Cognition and memory are normal.  Nursing note and vitals reviewed.   Results for orders placed or performed in visit on 12/03/15  CBC with Differential/Platelet  Result Value Ref Range   WBC 6.1 3.4 - 10.8 x10E3/uL   RBC 5.07 4.14 - 5.80 x10E6/uL   Hemoglobin 15.3 12.6 - 17.7 g/dL   Hematocrit 40.945.8 81.137.5 - 51.0 %   MCV 90 79 - 97 fL   MCH 30.2 26.6 - 33.0 pg   MCHC 33.4 31.5 - 35.7 g/dL   RDW 91.413.4 78.212.3 - 95.615.4 %   Platelets 234 150 - 379 x10E3/uL   Neutrophils 59 Not Estab. %   Lymphs 31 Not Estab. %   Monocytes 7 Not Estab. %   Eos 2 Not Estab. %   Basos 1 Not Estab. %   Neutrophils Absolute 3.6 1.4 - 7.0 x10E3/uL   Lymphocytes Absolute 1.9 0.7 - 3.1 x10E3/uL   Monocytes Absolute 0.4 0.1 - 0.9 x10E3/uL   EOS (ABSOLUTE) 0.1 0.0 - 0.4 x10E3/uL   Basophils Absolute 0.0 0.0 - 0.2 x10E3/uL   Immature Granulocytes 0 Not Estab. %   Immature Grans (Abs) 0.0 0.0 - 0.1 x10E3/uL  Bayer DCA Hb A1c Waived  Result Value Ref Range   Bayer DCA Hb A1c Waived 6.1 <7.0 %  Comprehensive metabolic panel    Result Value Ref Range   Glucose 121 (H) 65 - 99 mg/dL   BUN 8 6 - 24 mg/dL   Creatinine, Ser 2.130.92 0.76 - 1.27 mg/dL   GFR calc non Af Amer 102 >59 mL/min/1.73   GFR calc Af Amer 117 >59 mL/min/1.73   BUN/Creatinine Ratio 9 9 - 20   Sodium 143 134 - 144 mmol/L   Potassium 4.6 3.5 - 5.2 mmol/L   Chloride 101 96 - 106 mmol/L   CO2 26 18 - 29 mmol/L   Calcium 9.8 8.7 - 10.2 mg/dL   Total Protein 6.9 6.0 - 8.5 g/dL   Albumin 4.2 3.5 - 5.5 g/dL   Globulin, Total 2.7 1.5 - 4.5 g/dL   Albumin/Globulin Ratio 1.6 1.2 - 2.2   Bilirubin Total 0.3 0.0 - 1.2 mg/dL   Alkaline Phosphatase 117 39 - 117 IU/L   AST 24 0 - 40 IU/L   ALT 28 0 - 44 IU/L  Lipid Panel Piccolo, Waived  Result Value Ref Range   Cholesterol Piccolo, Waived 167 <200 mg/dL   HDL Chol Piccolo, Waived 69 >59 mg/dL   Triglycerides Piccolo,Waived 135 <150 mg/dL   Chol/HDL Ratio Piccolo,Waive 2.4 mg/dL   LDL Chol Calc Piccolo Waived 70 <100 mg/dL   VLDL Chol Calc Piccolo,Waive 27 <30 mg/dL  Microalbumin, Urine Waived  Result Value Ref Range   Microalb, Ur Waived 10 0 - 19 mg/L   Creatinine, Urine Waived 200 10 - 300 mg/dL   Microalb/Creat Ratio <30 <30 mg/g  TSH  Result Value Ref Range   TSH 1.330 0.450 - 4.500 uIU/mL  UA/M w/rflx Culture, Routine  Result Value Ref Range   Specific Gravity, UA 1.020 1.005 - 1.030   pH, UA 5.5 5.0 - 7.5   Color, UA Yellow Yellow   Appearance Ur Clear Clear   Leukocytes, UA Negative Negative   Protein, UA Negative Negative/Trace   Glucose, UA Negative Negative   Ketones, UA Negative Negative   RBC, UA Negative Negative   Bilirubin, UA Negative Negative   Urobilinogen, Ur 0.2 0.2 - 1.0 mg/dL   Nitrite, UA Negative Negative  Assessment & Plan:   Problem List Items Addressed This Visit      Cardiovascular and Mediastinum   HTN (hypertension)    Better on recheck. Continue current regimen. Recheck 6 month.       Relevant Medications   pravastatin (PRAVACHOL) 40 MG tablet    losartan (COZAAR) 25 MG tablet   Other Relevant Orders   Comprehensive metabolic panel (Completed)   Microalbumin, Urine Waived (Completed)     Digestive   Gastroesophageal reflux disease with esophagitis    Under good control. Continue current regimen. Continue to monitor.       Relevant Orders   CBC with Differential/Platelet (Completed)     Endocrine   Diabetes mellitus type 2, diet-controlled (HCC)    Under good control. Continue current regimen. Continue to monitor.       Relevant Medications   pravastatin (PRAVACHOL) 40 MG tablet   losartan (COZAAR) 25 MG tablet   Other Relevant Orders   Bayer DCA Hb A1c Waived (Completed)   Comprehensive metabolic panel (Completed)   Microalbumin, Urine Waived (Completed)   UA/M w/rflx Culture, Routine (Completed)     Other   HLD (hyperlipidemia)    Under good control. Continue current regimen. Continue to monitor.       Relevant Medications   pravastatin (PRAVACHOL) 40 MG tablet   losartan (COZAAR) 25 MG tablet   Other Relevant Orders   Comprehensive metabolic panel (Completed)   Lipid Panel Piccolo, Waived (Completed)    Other Visit Diagnoses    Routine general medical examination at a health care facility    -  Primary   Up to date on vaccines. Colonoscopy up to date. Screening labs checked today. Continue diet and exercise.    Relevant Orders   CBC with Differential/Platelet (Completed)   Bayer DCA Hb A1c Waived (Completed)   Comprehensive metabolic panel (Completed)   Lipid Panel Piccolo, Waived (Completed)   Microalbumin, Urine Waived (Completed)   TSH (Completed)   UA/M w/rflx Culture, Routine (Completed)   Immunization due       Flu shot given today.   Relevant Orders   Flu Vaccine QUAD 36+ mos PF IM (Fluarix & Fluzone Quad PF) (Completed)      Discussed aspirin prophylaxis for myocardial infarction prevention and decision was made to continue ASA  LABORATORY TESTING:  Health maintenance labs ordered today  as discussed above.   IMMUNIZATIONS:   - Tdap: Tetanus vaccination status reviewed: last tetanus booster within 10 years. - Influenza: Up to date - Pneumovax: Up to date - Prevnar: Not applicable - Zostavax vaccine: Not applicable  SCREENING: - Colonoscopy: Up to date  Discussed with patient purpose of the colonoscopy is to detect colon cancer at curable precancerous or early stages   PATIENT COUNSELING:    Sexuality: Discussed sexually transmitted diseases, partner selection, use of condoms, avoidance of unintended pregnancy  and contraceptive alternatives.   Advised to avoid cigarette smoking.  I discussed with the patient that most people either abstain from alcohol or drink within safe limits (<=14/week and <=4 drinks/occasion for males, <=7/weeks and <= 3 drinks/occasion for females) and that the risk for alcohol disorders and other health effects rises proportionally with the number of drinks per week and how often a drinker exceeds daily limits.  Discussed cessation/primary prevention of drug use and availability of treatment for abuse.   Diet: Encouraged to adjust caloric intake to maintain  or achieve ideal body weight, to reduce intake of dietary saturated fat and  total fat, to limit sodium intake by avoiding high sodium foods and not adding table salt, and to maintain adequate dietary potassium and calcium preferably from fresh fruits, vegetables, and low-fat dairy products.    stressed the importance of regular exercise  Injury prevention: Discussed safety belts, safety helmets, smoke detector, smoking near bedding or upholstery.   Dental health: Discussed importance of regular tooth brushing, flossing, and dental visits.   Follow up plan: NEXT PREVENTATIVE PHYSICAL DUE IN 1 YEAR. No Follow-up on file.

## 2015-12-04 ENCOUNTER — Encounter: Payer: Self-pay | Admitting: Family Medicine

## 2015-12-04 LAB — CBC WITH DIFFERENTIAL/PLATELET
BASOS ABS: 0 10*3/uL (ref 0.0–0.2)
Basos: 1 %
EOS (ABSOLUTE): 0.1 10*3/uL (ref 0.0–0.4)
EOS: 2 %
HEMOGLOBIN: 15.3 g/dL (ref 12.6–17.7)
Hematocrit: 45.8 % (ref 37.5–51.0)
IMMATURE GRANULOCYTES: 0 %
Immature Grans (Abs): 0 10*3/uL (ref 0.0–0.1)
Lymphocytes Absolute: 1.9 10*3/uL (ref 0.7–3.1)
Lymphs: 31 %
MCH: 30.2 pg (ref 26.6–33.0)
MCHC: 33.4 g/dL (ref 31.5–35.7)
MCV: 90 fL (ref 79–97)
MONOCYTES: 7 %
MONOS ABS: 0.4 10*3/uL (ref 0.1–0.9)
NEUTROS PCT: 59 %
Neutrophils Absolute: 3.6 10*3/uL (ref 1.4–7.0)
Platelets: 234 10*3/uL (ref 150–379)
RBC: 5.07 x10E6/uL (ref 4.14–5.80)
RDW: 13.4 % (ref 12.3–15.4)
WBC: 6.1 10*3/uL (ref 3.4–10.8)

## 2015-12-04 LAB — COMPREHENSIVE METABOLIC PANEL
ALK PHOS: 117 IU/L (ref 39–117)
ALT: 28 IU/L (ref 0–44)
AST: 24 IU/L (ref 0–40)
Albumin/Globulin Ratio: 1.6 (ref 1.2–2.2)
Albumin: 4.2 g/dL (ref 3.5–5.5)
BUN/Creatinine Ratio: 9 (ref 9–20)
BUN: 8 mg/dL (ref 6–24)
Bilirubin Total: 0.3 mg/dL (ref 0.0–1.2)
CALCIUM: 9.8 mg/dL (ref 8.7–10.2)
CO2: 26 mmol/L (ref 18–29)
CREATININE: 0.92 mg/dL (ref 0.76–1.27)
Chloride: 101 mmol/L (ref 96–106)
GFR calc Af Amer: 117 mL/min/{1.73_m2} (ref >59)
GFR, EST NON AFRICAN AMERICAN: 102 mL/min/{1.73_m2} (ref >59)
GLUCOSE: 121 mg/dL — AB (ref 65–99)
Globulin, Total: 2.7 g/dL (ref 1.5–4.5)
Potassium: 4.6 mmol/L (ref 3.5–5.2)
SODIUM: 143 mmol/L (ref 134–144)
Total Protein: 6.9 g/dL (ref 6.0–8.5)

## 2015-12-04 LAB — TSH: TSH: 1.33 u[IU]/mL (ref 0.450–4.500)

## 2015-12-23 DIAGNOSIS — R05 Cough: Secondary | ICD-10-CM | POA: Diagnosis not present

## 2016-04-15 DIAGNOSIS — H5211 Myopia, right eye: Secondary | ICD-10-CM | POA: Diagnosis not present

## 2016-04-15 LAB — HM DIABETES EYE EXAM

## 2016-05-23 ENCOUNTER — Other Ambulatory Visit: Payer: Self-pay | Admitting: Family Medicine

## 2016-06-03 ENCOUNTER — Ambulatory Visit (INDEPENDENT_AMBULATORY_CARE_PROVIDER_SITE_OTHER): Payer: BLUE CROSS/BLUE SHIELD | Admitting: Family Medicine

## 2016-06-03 ENCOUNTER — Encounter: Payer: Self-pay | Admitting: Family Medicine

## 2016-06-03 VITALS — BP 130/84 | HR 63 | Wt 187.2 lb

## 2016-06-03 DIAGNOSIS — J301 Allergic rhinitis due to pollen: Secondary | ICD-10-CM

## 2016-06-03 DIAGNOSIS — I10 Essential (primary) hypertension: Secondary | ICD-10-CM

## 2016-06-03 DIAGNOSIS — K2100 Gastro-esophageal reflux disease with esophagitis, without bleeding: Secondary | ICD-10-CM

## 2016-06-03 DIAGNOSIS — E119 Type 2 diabetes mellitus without complications: Secondary | ICD-10-CM | POA: Diagnosis not present

## 2016-06-03 DIAGNOSIS — E782 Mixed hyperlipidemia: Secondary | ICD-10-CM

## 2016-06-03 LAB — BAYER DCA HB A1C WAIVED: HB A1C (BAYER DCA - WAIVED): 5.7 % (ref <7.0)

## 2016-06-03 LAB — MICROALBUMIN, URINE WAIVED
Creatinine, Urine Waived: 200 mg/dL (ref 10–300)
Microalb, Ur Waived: 30 mg/L — ABNORMAL HIGH (ref 0–19)
Microalb/Creat Ratio: <30 mg/g (ref <30)

## 2016-06-03 LAB — HEMOGLOBIN A1C: Hemoglobin A1C: 5.7

## 2016-06-03 MED ORDER — LOSARTAN POTASSIUM 25 MG PO TABS: 25.0000 mg | ORAL_TABLET | Freq: Every day | ORAL | 1 refills | Status: DC

## 2016-06-03 MED ORDER — PRAVASTATIN SODIUM 40 MG PO TABS: 40.0000 mg | ORAL_TABLET | Freq: Every day | ORAL | 1 refills | Status: DC

## 2016-06-03 MED ORDER — RABEPRAZOLE SODIUM 20 MG PO TBEC: 20.0000 mg | DELAYED_RELEASE_TABLET | Freq: Every day | ORAL | 1 refills | Status: DC

## 2016-06-03 NOTE — Progress Notes (Signed)
BP 130/84 (BP Location: Left Arm, Patient Position: Sitting, Cuff Size: Large)   Pulse 63   Wt 187 lb 3.2 oz (84.9 kg)   SpO2 98%   BMI 26.56 kg/m    Subjective:    Patient ID: Stephen Mayo, male    DOB: 04-11-1970, 46 y.o.   MRN: [id number]  HPI: Stephen Mayo is a 46 y.o. male  Chief Complaint  Patient presents with  . Diabetes  . Hyperlipidemia  . Hypertension   UPPER RESPIRATORY TRACT INFECTION Duration: 1 day Worst symptom: congestion Fever: no Cough: no Shortness of breath: no Wheezing: no Chest pain: no Chest tightness: no Chest congestion: no Nasal congestion: yes Runny nose: yes Post nasal drip: no Sneezing: no Sore throat: no Swollen glands: no Sinus pressure: no Headache: no Face pain: no Toothache: no Ear pain: no  Ear pressure: no  Eyes red/itching:no Eye drainage/crusting: no  Vomiting: no Rash: no Fatigue: no Sick contacts: yes Strep contacts: no  Context: stable Recurrent sinusitis: no Relief with OTC cold/cough medications: no  Treatments attempted: none   DIABETES Hypoglycemic episodes:no Polydipsia/polyuria: no Visual disturbance: no Chest pain: no Paresthesias: no Glucose Monitoring: yes  Accucheck frequency: A few times a week  Fasting glucose: 90s-110s Taking Insulin?: no Blood Pressure Monitoring: not checking Retinal Examination: Up to Date Foot Exam: Up to Date Diabetic Education: Completed Pneumovax: Up to Date Influenza: Up to Date Aspirin: no  HYPERTENSION / HYPERLIPIDEMIA Satisfied with current treatment? yes Duration of hypertension: chronic BP monitoring frequency: not checking BP medication side effects: no Past BP meds: losartan Duration of hyperlipidemia: chronic Cholesterol medication side effects: no Cholesterol supplements: none Past cholesterol medications: pravastatin Medication compliance: excellent compliance Aspirin: no Recent stressors: no Recurrent headaches: no Visual changes:  no Palpitations: no Dyspnea: no Chest pain: no Lower extremity edema: no Dizzy/lightheaded: no   GERD GERD control status: stable  Satisfied with current treatment? yes Heartburn frequency: occasionally Medication side effects: no  Medication compliance: excellent compliance Previous GERD medications: yes Antacid use frequency:  rarely Dysphagia: no Odynophagia:  no Hematemesis: no Blood in stool: no EGD: no  Relevant past medical, surgical, family and social history reviewed and updated as indicated. Interim medical history since our last visit reviewed. Allergies and medications reviewed and updated.  Review of Systems  Constitutional: Negative.   Respiratory: Negative.   Cardiovascular: Negative.   Gastrointestinal: Negative.   Psychiatric/Behavioral: Negative.     Per HPI unless specifically indicated above     Objective:    BP 130/84 (BP Location: Left Arm, Patient Position: Sitting, Cuff Size: Large)   Pulse 63   Wt 187 lb 3.2 oz (84.9 kg)   SpO2 98%   BMI 26.56 kg/m   Wt Readings from Last 3 Encounters:  06/03/16 187 lb 3.2 oz (84.9 kg)  12/03/15 198 lb 4.8 oz (89.9 kg)  10/25/15 200 lb (90.7 kg)    Physical Exam  Constitutional: He is oriented to person, place, and time. He appears well-developed and well-nourished. No distress.  HENT:  Head: Normocephalic and atraumatic.  Right Ear: Hearing normal.  Left Ear: Hearing normal.  Nose: Nose normal.  Eyes: Conjunctivae and lids are normal. Right eye exhibits no discharge. Left eye exhibits no discharge. No scleral icterus.  Cardiovascular: Normal rate, regular rhythm, normal heart sounds and intact distal pulses.  Exam reveals no gallop and no friction rub.   No murmur heard. Pulmonary/Chest: Effort normal and breath sounds normal. No respiratory distress. He  has no wheezes. He has no rales. He exhibits no tenderness.  Musculoskeletal: Normal range of motion.  Neurological: He is alert and oriented to  person, place, and time.  Skin: Skin is warm, dry and intact. No rash noted. He is not diaphoretic. No erythema. No pallor.  Psychiatric: He has a normal mood and affect. His speech is normal and behavior is normal. Judgment and thought content normal. Cognition and memory are normal.  Nursing note and vitals reviewed.   Results for orders placed or performed in visit on 06/03/16  Hemoglobin A1c  Result Value Ref Range   Hemoglobin A1C 5.7       Assessment & Plan:   Problem List Items Addressed This Visit      Cardiovascular and Mediastinum   HTN (hypertension) - Primary    Under good control. Continue current regimen. Continue to monitor. Call with any concerns. Refills given today.      Relevant Medications   pravastatin (PRAVACHOL) 40 MG tablet   losartan (COZAAR) 25 MG tablet   Other Relevant Orders   Comprehensive metabolic panel   Microalbumin, Urine Waived     Digestive   Gastroesophageal reflux disease with esophagitis    Under good control. Continue current regimen. Continue to monitor. Call with any concerns. Refills given today.        Endocrine   Diabetes mellitus type 2, diet-controlled (HCC)    Under good control. A1c today is 5.7. Continue current regimen. Continue to monitor. Call with any concerns. Refills given today.      Relevant Medications   pravastatin (PRAVACHOL) 40 MG tablet   losartan (COZAAR) 25 MG tablet   Other Relevant Orders   Bayer DCA Hb A1c Waived   Comprehensive metabolic panel   Microalbumin, Urine Waived     Other   HLD (hyperlipidemia)    Under good control. Continue current regimen. Continue to monitor. Call with any concerns. Refills given today.      Relevant Medications   pravastatin (PRAVACHOL) 40 MG tablet   losartan (COZAAR) 25 MG tablet   Other Relevant Orders   Comprehensive metabolic panel   Lipid Panel w/o Chol/HDL Ratio    Other Visit Diagnoses    Seasonal allergic rhinitis due to pollen       Continue  current regimen. Call if not getting better or getting worse.        Follow up plan: Return in about 6 months (around 12/04/2016) for Physical.

## 2016-06-03 NOTE — Assessment & Plan Note (Signed)
Under good control. Continue current regimen. Continue to monitor. Call with any concerns. Refills given today. 

## 2016-06-03 NOTE — Assessment & Plan Note (Addendum)
Under good control. A1c today is 5.7. Continue current regimen. Continue to monitor. Call with any concerns. Refills given today.

## 2016-06-04 ENCOUNTER — Encounter: Payer: Self-pay | Admitting: Family Medicine

## 2016-06-04 LAB — LIPID PANEL W/O CHOL/HDL RATIO
Cholesterol, Total: 189 mg/dL (ref 100–199)
HDL: 66 mg/dL (ref >39)
LDL CALC: 88 mg/dL (ref 0–99)
TRIGLYCERIDES: 177 mg/dL — AB (ref 0–149)
VLDL Cholesterol Cal: 35 mg/dL (ref 5–40)

## 2016-06-04 LAB — COMPREHENSIVE METABOLIC PANEL
ALBUMIN: 4.4 g/dL (ref 3.5–5.5)
ALT: 22 IU/L (ref 0–44)
AST: 23 IU/L (ref 0–40)
Albumin/Globulin Ratio: 1.9 (ref 1.2–2.2)
Alkaline Phosphatase: 115 IU/L (ref 39–117)
BUN / CREAT RATIO: 12 (ref 9–20)
BUN: 9 mg/dL (ref 6–24)
Bilirubin Total: 0.4 mg/dL (ref 0.0–1.2)
CALCIUM: 9.6 mg/dL (ref 8.7–10.2)
CO2: 27 mmol/L (ref 18–29)
CREATININE: 0.73 mg/dL — AB (ref 0.76–1.27)
Chloride: 98 mmol/L (ref 96–106)
GFR, EST AFRICAN AMERICAN: 130 mL/min/{1.73_m2} (ref >59)
GFR, EST NON AFRICAN AMERICAN: 113 mL/min/{1.73_m2} (ref >59)
GLOBULIN, TOTAL: 2.3 g/dL (ref 1.5–4.5)
Glucose: 115 mg/dL — ABNORMAL HIGH (ref 65–99)
POTASSIUM: 4.5 mmol/L (ref 3.5–5.2)
SODIUM: 140 mmol/L (ref 134–144)
TOTAL PROTEIN: 6.7 g/dL (ref 6.0–8.5)

## 2016-11-04 ENCOUNTER — Other Ambulatory Visit: Payer: Self-pay | Admitting: Family Medicine

## 2016-12-15 ENCOUNTER — Encounter: Payer: Self-pay | Admitting: Family Medicine

## 2016-12-15 ENCOUNTER — Ambulatory Visit (INDEPENDENT_AMBULATORY_CARE_PROVIDER_SITE_OTHER): Payer: BLUE CROSS/BLUE SHIELD | Admitting: Family Medicine

## 2016-12-15 VITALS — BP 138/70 | HR 78 | Temp 98.6°F | Ht 70.3 in | Wt 196.6 lb

## 2016-12-15 DIAGNOSIS — K2100 Gastro-esophageal reflux disease with esophagitis, without bleeding: Secondary | ICD-10-CM

## 2016-12-15 DIAGNOSIS — Z0000 Encounter for general adult medical examination without abnormal findings: Secondary | ICD-10-CM | POA: Diagnosis not present

## 2016-12-15 DIAGNOSIS — E782 Mixed hyperlipidemia: Secondary | ICD-10-CM

## 2016-12-15 DIAGNOSIS — I10 Essential (primary) hypertension: Secondary | ICD-10-CM | POA: Diagnosis not present

## 2016-12-15 DIAGNOSIS — E119 Type 2 diabetes mellitus without complications: Secondary | ICD-10-CM | POA: Diagnosis not present

## 2016-12-15 LAB — UA/M W/RFLX CULTURE, ROUTINE
Bilirubin, UA: NEGATIVE
Glucose, UA: NEGATIVE
KETONES UA: NEGATIVE
Leukocytes, UA: NEGATIVE
NITRITE UA: NEGATIVE
PH UA: 5.5 (ref 5.0–7.5)
Protein, UA: NEGATIVE
RBC, UA: NEGATIVE
Specific Gravity, UA: 1.01 (ref 1.005–1.030)
UUROB: 0.2 mg/dL (ref 0.2–1.0)

## 2016-12-15 LAB — MICROALBUMIN, URINE WAIVED
Creatinine, Urine Waived: 200 mg/dL (ref 10–300)
Microalb, Ur Waived: 10 mg/L (ref 0–19)

## 2016-12-15 LAB — BAYER DCA HB A1C WAIVED: HB A1C (BAYER DCA - WAIVED): 5.6 % (ref <7.0)

## 2016-12-15 MED ORDER — RABEPRAZOLE SODIUM 20 MG PO TBEC: 20.0000 mg | DELAYED_RELEASE_TABLET | Freq: Every day | ORAL | 1 refills | Status: DC

## 2016-12-15 MED ORDER — GLUCOSE BLOOD VI STRP: ORAL_STRIP | 4 refills | Status: DC

## 2016-12-15 MED ORDER — ONETOUCH DELICA LANCETS FINE MISC: 4 refills | Status: DC

## 2016-12-15 MED ORDER — LOSARTAN POTASSIUM 25 MG PO TABS: 25.0000 mg | ORAL_TABLET | Freq: Every day | ORAL | 1 refills | Status: DC

## 2016-12-15 MED ORDER — PRAVASTATIN SODIUM 40 MG PO TABS: 40.0000 mg | ORAL_TABLET | Freq: Every day | ORAL | 1 refills | Status: DC

## 2016-12-15 NOTE — Assessment & Plan Note (Signed)
Under good control. Continue current regimen. Continue to monitor. Call with any concerns. 

## 2016-12-15 NOTE — Assessment & Plan Note (Signed)
Under good control on recheck. Continue current regimen. Continue to monitor. Call with any concerns.  

## 2016-12-15 NOTE — Patient Instructions (Addendum)

## 2016-12-15 NOTE — Progress Notes (Signed)
BP 138/70 (BP Location: Left Arm, Cuff Size: Normal)   Pulse 78   Temp 98.6 F (37 C)   Ht 5' 10.3" (1.786 m)   Wt 196 lb 9 oz (89.2 kg)   SpO2 100%   BMI 27.96 kg/m    Subjective:    Patient ID: [name], male    DOB: Dec 04, 1970, 46 y.o.   MRN: [id number]  HPI: SALEEM COCCIA is a 46 y.o. male presenting on 12/15/2016 for comprehensive medical examination. Current medical complaints include:  HYPERTENSION / HYPERLIPIDEMIA Satisfied with current treatment? yes Duration of hypertension: chronic BP monitoring frequency: not checking BP medication side effects: no Past BP meds: losartan Duration of hyperlipidemia: chronic Cholesterol medication side effects: no Cholesterol supplements: none Past cholesterol medications: pravastatin (pravachol) Medication compliance: excellent compliance Aspirin: no Recent stressors: no Recurrent headaches: no Visual changes: no Palpitations: no Dyspnea: no Chest pain: no Lower extremity edema: no Dizzy/lightheaded: no  DIABETES Hypoglycemic episodes:no Polydipsia/polyuria: no Visual disturbance: no Chest pain: no Paresthesias: no Glucose Monitoring: yes  Accucheck frequency: Daily  Fasting glucose: 88-135 Taking Insulin?: no Blood Pressure Monitoring: not checking Retinal Examination: Up to Date Foot Exam: Up to Date Diabetic Education: Completed Pneumovax: Up to Date Influenza: Up to Date Aspirin: yes  GERD GERD control status: controlled Satisfied with current treatment? yes Heartburn frequency: rarely  Medication side effects: no  Medication compliance: excellent Dysphagia: no Odynophagia:  no Hematemesis: no Blood in stool: no EGD: no  He currently lives with: wife and kids Interim Problems from his last visit: no  Depression Screen done today and results listed below:  Depression screen [facility] [address] 2/9 12/15/2016 12/03/2015 05/30/2015 11/29/2014  Decreased Interest 0 0 0 0  Down, Depressed, Hopeless 0 0 0 0    PHQ - 2 Score 0 0 0 0    Past Medical History:  Past Medical History:  Diagnosis Date  . Allergy   . Back pain   . Diabetes mellitus without complication (HCC)   . GERD (gastroesophageal reflux disease)   . Hyperlipidemia   . Hypertension     Surgical History:  Past Surgical History:  Procedure Laterality Date  . COLONOSCOPY WITH PROPOFOL N/A 10/25/2015   Procedure: COLONOSCOPY WITH PROPOFOL;  Surgeon: Scot Jun, MD;  Location: [facility];  Service: Endoscopy;  Laterality: N/A;  . HAND SURGERY Right 2012   fracture- 5 pins and reconstruction  . HERNIA REPAIR  2002   Double  . NASAL SINUS SURGERY  2008   Maxillary Sinus Removed  . STERIOD INJECTION    . ulcer on his larynx      Medications:  Current Outpatient Medications on File Prior to Visit  Medication Sig  . fluticasone (FLONASE) 50 MCG/ACT nasal spray Place 2 sprays into both nostrils daily.   No current facility-administered medications on file prior to visit.     Allergies:  Allergies  Allergen Reactions  . Lisinopril Cough    Social History:  Social History   Socioeconomic History  . Marital status: Married    Spouse name: Not on file  . Number of children: Not on file  . Years of education: Not on file  . Highest education level: Not on file  Social Needs  . Financial resource strain: Not on file  . Food insecurity - worry: Not on file  . Food insecurity - inability: Not on file  . Transportation needs - medical: Not on file  . Transportation needs - non-medical: Not on  file  Occupational History  . Not on file  Tobacco Use  . Smoking status: Former Smoker    Last attempt to quit: 06/25/2005    Years since quitting: 11.4  . Smokeless tobacco: Former Engineer, waterUser  Substance and Sexual Activity  . Alcohol use: Yes    Alcohol/week: 7.2 oz    Types: 10 Cans of beer, 2 Shots of liquor per week  . Drug use: No  . Sexual activity: Yes  Other Topics Concern  . Not on file  Social History  Narrative  . Not on file   Social History   Tobacco Use  Smoking Status Former Smoker  . Last attempt to quit: 06/25/2005  . Years since quitting: 11.4  Smokeless Tobacco Former NeurosurgeonUser   Social History   Substance and Sexual Activity  Alcohol Use Yes  . Alcohol/week: 7.2 oz  . Types: 10 Cans of beer, 2 Shots of liquor per week    Family History:  Family History  Problem Relation Age of Onset  . Cancer Mother        Lung  . Cancer Father        colon  . Cancer Maternal Grandmother        breast, brain and colon  . Lymphoma Paternal Grandmother   . Cancer Paternal Grandfather        lung and throat    Past medical history, surgical history, medications, allergies, family history and social history reviewed with patient today and changes made to appropriate areas of the chart.   Review of Systems  Constitutional: Negative.   HENT: Positive for congestion. Negative for ear discharge, ear pain, hearing loss, nosebleeds, sinus pain, sore throat and tinnitus.   Eyes: Negative.   Respiratory: Negative.  Negative for stridor.   Cardiovascular: Negative.   Gastrointestinal: Negative.   Genitourinary: Negative.   Musculoskeletal: Negative.   Skin: Negative.   Neurological: Negative.   Endo/Heme/Allergies: Positive for environmental allergies. Negative for polydipsia. Does not bruise/bleed easily.  Psychiatric/Behavioral: Negative.     All other ROS negative except what is listed above and in the HPI.      Objective:    BP 138/70 (BP Location: Left Arm, Cuff Size: Normal)   Pulse 78   Temp 98.6 F (37 C)   Ht 5' 10.3" (1.786 m)   Wt 196 lb 9 oz (89.2 kg)   SpO2 100%   BMI 27.96 kg/m   Wt Readings from Last 3 Encounters:  12/15/16 196 lb 9 oz (89.2 kg)  06/03/16 187 lb 3.2 oz (84.9 kg)  12/03/15 198 lb 4.8 oz (89.9 kg)    Physical Exam  Constitutional: He is oriented to person, place, and time. He appears well-developed and well-nourished. No distress.  HENT:    Head: Normocephalic and atraumatic.  Right Ear: Hearing, tympanic membrane, external ear and ear canal normal.  Left Ear: Hearing, tympanic membrane, external ear and ear canal normal.  Nose: Nose normal.  Mouth/Throat: Uvula is midline, oropharynx is clear and moist and mucous membranes are normal. No oropharyngeal exudate.  Eyes: Conjunctivae, EOM and lids are normal. Pupils are equal, round, and reactive to light. Right eye exhibits no discharge. Left eye exhibits no discharge. No scleral icterus.  Neck: Normal range of motion. Neck supple. No JVD present. No tracheal deviation present. No thyromegaly present.  Cardiovascular: Normal rate, regular rhythm, normal heart sounds and intact distal pulses. Exam reveals no gallop and no friction rub.  No murmur heard. Pulmonary/Chest: Effort  normal and breath sounds normal. No stridor. No respiratory distress. He has no wheezes. He has no rales. He exhibits no tenderness.  Abdominal: Soft. Bowel sounds are normal. He exhibits no distension and no mass. There is no tenderness. There is no rebound and no guarding.  Genitourinary:  Genitourinary Comments: Genital exam deferred today with shared decision making  Musculoskeletal: Normal range of motion. He exhibits no edema, tenderness or deformity.  Lymphadenopathy:    He has no cervical adenopathy.  Neurological: He is alert and oriented to person, place, and time. He has normal reflexes. He displays normal reflexes. No cranial nerve deficit. He exhibits normal muscle tone. Coordination normal.  Skin: Skin is warm, dry and intact. No rash noted. He is not diaphoretic. No erythema. No pallor.  Psychiatric: He has a normal mood and affect. His speech is normal and behavior is normal. Judgment and thought content normal. Cognition and memory are normal.  Nursing note and vitals reviewed.   Results for orders placed or performed in visit on 12/15/16  HM COLONOSCOPY  Result Value Ref Range   HM  Colonoscopy See Report (in chart) See Report (in chart), Patient Reported      Assessment & Plan:   Problem List Items Addressed This Visit      Cardiovascular and Mediastinum   HTN (hypertension)    Under good control on recheck. Continue current regimen. Continue to monitor. Call with any concerns.       Relevant Medications   losartan (COZAAR) 25 MG tablet   pravastatin (PRAVACHOL) 40 MG tablet   Other Relevant Orders   Comprehensive metabolic panel   Microalbumin, Urine Waived   TSH   UA/M w/rflx Culture, Routine     Digestive   Gastroesophageal reflux disease with esophagitis    Under good control. Continue current regimen. Continue to monitor. Call with any concerns.       Relevant Orders   CBC with Differential/Platelet   Comprehensive metabolic panel   TSH   UA/M w/rflx Culture, Routine     Endocrine   Diabetes mellitus type 2, diet-controlled (HCC)    Under good control with A1c of 5.6. Continue current regimen. Continue to monitor. Call with any concerns.       Relevant Medications   losartan (COZAAR) 25 MG tablet   pravastatin (PRAVACHOL) 40 MG tablet   Other Relevant Orders   Bayer DCA Hb A1c Waived   Comprehensive metabolic panel   Microalbumin, Urine Waived   TSH   UA/M w/rflx Culture, Routine     Other   HLD (hyperlipidemia)    Under good control. Continue current regimen. Continue to monitor. Call with any concerns.       Relevant Medications   losartan (COZAAR) 25 MG tablet   pravastatin (PRAVACHOL) 40 MG tablet   Other Relevant Orders   Comprehensive metabolic panel   Lipid Panel w/o Chol/HDL Ratio   TSH   UA/M w/rflx Culture, Routine    Other Visit Diagnoses    Routine general medical examination at a health care facility    -  Primary   Vaccines up to date. Screening labs checked today. Colonoscopy up to date. Continue diet and exercise. Call with any concerns.        LABORATORY TESTING:  Health maintenance labs ordered today as  discussed above.   IMMUNIZATIONS:   - Tdap: Tetanus vaccination status reviewed: last tetanus booster within 10 years. - Influenza: Up to date - Pneumovax: Up to date  SCREENING: - Colonoscopy: Up to date  Discussed with patient purpose of the colonoscopy is to detect colon cancer at curable precancerous or early stages   PATIENT COUNSELING:    Sexuality: Discussed sexually transmitted diseases, partner selection, use of condoms, avoidance of unintended pregnancy  and contraceptive alternatives.   Advised to avoid cigarette smoking.  I discussed with the patient that most people either abstain from alcohol or drink within safe limits (<=14/week and <=4 drinks/occasion for males, <=7/weeks and <= 3 drinks/occasion for females) and that the risk for alcohol disorders and other health effects rises proportionally with the number of drinks per week and how often a drinker exceeds daily limits.  Discussed cessation/primary prevention of drug use and availability of treatment for abuse.   Diet: Encouraged to adjust caloric intake to maintain  or achieve ideal body weight, to reduce intake of dietary saturated fat and total fat, to limit sodium intake by avoiding high sodium foods and not adding table salt, and to maintain adequate dietary potassium and calcium preferably from fresh fruits, vegetables, and low-fat dairy products.    stressed the importance of regular exercise  Injury prevention: Discussed safety belts, safety helmets, smoke detector, smoking near bedding or upholstery.   Dental health: Discussed importance of regular tooth brushing, flossing, and dental visits.   Follow up plan: NEXT PREVENTATIVE PHYSICAL DUE IN 1 YEAR. Return in about 6 months (around 06/14/2017) for follow up sugars, BP and chol.

## 2016-12-15 NOTE — Assessment & Plan Note (Signed)
Under good control with A1c of 5.6. Continue current regimen. Continue to monitor. Call with any concerns.  

## 2016-12-16 LAB — CBC WITH DIFFERENTIAL/PLATELET
BASOS ABS: 0 10*3/uL (ref 0.0–0.2)
BASOS: 0 %
EOS (ABSOLUTE): 0.2 10*3/uL (ref 0.0–0.4)
Eos: 2 %
HEMOGLOBIN: 15.7 g/dL (ref 13.0–17.7)
Hematocrit: 45.9 % (ref 37.5–51.0)
IMMATURE GRANS (ABS): 0 10*3/uL (ref 0.0–0.1)
Immature Granulocytes: 0 %
LYMPHS: 36 %
Lymphocytes Absolute: 2.5 10*3/uL (ref 0.7–3.1)
MCH: 30.7 pg (ref 26.6–33.0)
MCHC: 34.2 g/dL (ref 31.5–35.7)
MCV: 90 fL (ref 79–97)
MONOCYTES: 8 %
Monocytes Absolute: 0.5 10*3/uL (ref 0.1–0.9)
NEUTROS ABS: 3.6 10*3/uL (ref 1.4–7.0)
Neutrophils: 54 %
Platelets: 231 10*3/uL (ref 150–379)
RBC: 5.11 x10E6/uL (ref 4.14–5.80)
RDW: 13.1 % (ref 12.3–15.4)
WBC: 6.8 10*3/uL (ref 3.4–10.8)

## 2016-12-16 LAB — LIPID PANEL W/O CHOL/HDL RATIO
CHOLESTEROL TOTAL: 204 mg/dL — AB (ref 100–199)
HDL: 55 mg/dL (ref >39)
LDL Calculated: 111 mg/dL — ABNORMAL HIGH (ref 0–99)
TRIGLYCERIDES: 188 mg/dL — AB (ref 0–149)
VLDL Cholesterol Cal: 38 mg/dL (ref 5–40)

## 2016-12-16 LAB — COMPREHENSIVE METABOLIC PANEL
A/G RATIO: 1.8 (ref 1.2–2.2)
ALBUMIN: 4.7 g/dL (ref 3.5–5.5)
ALK PHOS: 79 IU/L (ref 39–117)
ALT: 25 IU/L (ref 0–44)
AST: 24 IU/L (ref 0–40)
BILIRUBIN TOTAL: 0.4 mg/dL (ref 0.0–1.2)
BUN / CREAT RATIO: 13 (ref 9–20)
BUN: 11 mg/dL (ref 6–24)
CHLORIDE: 98 mmol/L (ref 96–106)
CO2: 27 mmol/L (ref 20–29)
Calcium: 9.4 mg/dL (ref 8.7–10.2)
Creatinine, Ser: 0.87 mg/dL (ref 0.76–1.27)
GFR calc non Af Amer: 105 mL/min/{1.73_m2} (ref >59)
GFR, EST AFRICAN AMERICAN: 121 mL/min/{1.73_m2} (ref >59)
GLOBULIN, TOTAL: 2.6 g/dL (ref 1.5–4.5)
Glucose: 93 mg/dL (ref 65–99)
POTASSIUM: 4.2 mmol/L (ref 3.5–5.2)
SODIUM: 140 mmol/L (ref 134–144)
TOTAL PROTEIN: 7.3 g/dL (ref 6.0–8.5)

## 2016-12-16 LAB — TSH: TSH: 2.49 u[IU]/mL (ref 0.450–4.500)

## 2017-05-12 LAB — HM DIABETES EYE EXAM

## 2017-06-18 ENCOUNTER — Ambulatory Visit: Payer: BLUE CROSS/BLUE SHIELD | Admitting: Family Medicine

## 2017-06-18 ENCOUNTER — Encounter: Payer: Self-pay | Admitting: Family Medicine

## 2017-06-18 VITALS — BP 156/94 | HR 56 | Temp 98.4°F | Ht 70.0 in | Wt 208.9 lb

## 2017-06-18 DIAGNOSIS — E119 Type 2 diabetes mellitus without complications: Secondary | ICD-10-CM

## 2017-06-18 DIAGNOSIS — E782 Mixed hyperlipidemia: Secondary | ICD-10-CM | POA: Diagnosis not present

## 2017-06-18 DIAGNOSIS — I10 Essential (primary) hypertension: Secondary | ICD-10-CM | POA: Diagnosis not present

## 2017-06-18 DIAGNOSIS — K2100 Gastro-esophageal reflux disease with esophagitis, without bleeding: Secondary | ICD-10-CM

## 2017-06-18 LAB — BAYER DCA HB A1C WAIVED: HB A1C (BAYER DCA - WAIVED): 5.8 % (ref <7.0)

## 2017-06-18 MED ORDER — RABEPRAZOLE SODIUM 20 MG PO TBEC: 20.0000 mg | DELAYED_RELEASE_TABLET | Freq: Every day | ORAL | 1 refills | Status: DC

## 2017-06-18 MED ORDER — LOSARTAN POTASSIUM 50 MG PO TABS: 50.0000 mg | ORAL_TABLET | Freq: Every day | ORAL | 1 refills | Status: DC

## 2017-06-18 MED ORDER — PRAVASTATIN SODIUM 40 MG PO TABS: 40.0000 mg | ORAL_TABLET | Freq: Every day | ORAL | 1 refills | Status: DC

## 2017-06-18 NOTE — Assessment & Plan Note (Signed)
Not under good control. Will increase to  and recheck in 1 month. Call with any concerns. Refills given.

## 2017-06-18 NOTE — Assessment & Plan Note (Signed)
Under good control. Continue current regimen. Continue to monitor. Call with any concerns. 

## 2017-06-18 NOTE — Progress Notes (Signed)
BP (!) 156/94 (BP Location: Right Arm, Cuff Size: Normal)   Pulse (!) 56   Temp 98.4 F (36.9 C) (Oral)   Ht  (1.778 m)   Wt 208 lb 14.4 oz (94.8 kg)   SpO2 99%   BMI 29.97 kg/m    Subjective:    Patient ID: Stephen Mayo, male    DOB: 23-Sep-1970, 47 y.o.   MRN: [id number]  HPI: Stephen Mayo is a 47 y.o. male  Chief Complaint  Patient presents with  . Follow-up    Patient states no complaints   DIABETES Hypoglycemic episodes:no Polydipsia/polyuria: no Visual disturbance: no Chest pain: no Paresthesias: no Glucose Monitoring: no  Accucheck frequency: Not Checking Taking Insulin?: no Blood Pressure Monitoring: not checking Retinal Examination: Up to Date Foot Exam: Up to Date Diabetic Education: Completed Pneumovax: Up to Date Influenza: Up to Date Aspirin: yes  HYPERTENSION / HYPERLIPIDEMIA Satisfied with current treatment? no Duration of hypertension: chronic BP monitoring frequency: not checking BP range:  BP medication side effects: no Past BP meds: losartan Duration of hyperlipidemia: chronic Cholesterol medication side effects: no Cholesterol supplements: none Past cholesterol medications: pravastatin Medication compliance: excellent compliance Aspirin: no Recent stressors: no Recurrent headaches: no Visual changes: no Palpitations: no Dyspnea: no Chest pain: no Lower extremity edema: no Dizzy/lightheaded: no   Relevant past medical, surgical, family and social history reviewed and updated as indicated. Interim medical history since our last visit reviewed. Allergies and medications reviewed and updated.  Review of Systems  Constitutional: Negative.   Respiratory: Negative.   Cardiovascular: Negative.   Neurological: Negative.   Psychiatric/Behavioral: Negative.     Per HPI unless specifically indicated above     Objective:    BP (!) 156/94 (BP Location: Right Arm, Cuff Size: Normal)   Pulse (!) 56   Temp 98.4 F (36.9  C) (Oral)   Ht  (1.778 m)   Wt 208 lb 14.4 oz (94.8 kg)   SpO2 99%   BMI 29.97 kg/m   Wt Readings from Last 3 Encounters:  06/18/17 208 lb 14.4 oz (94.8 kg)  12/15/16 196 lb 9 oz (89.2 kg)  06/03/16 187 lb 3.2 oz (84.9 kg)    Physical Exam  Constitutional: He is oriented to person, place, and time. He appears well-developed and well-nourished. No distress.  HENT:  Head: Normocephalic and atraumatic.  Right Ear: Hearing normal.  Left Ear: Hearing normal.  Nose: Nose normal.  Eyes: Conjunctivae and lids are normal. Right eye exhibits no discharge. Left eye exhibits no discharge. No scleral icterus.  Cardiovascular: Normal rate, regular rhythm, normal heart sounds and intact distal pulses. Exam reveals no gallop and no friction rub.  No murmur heard. Pulmonary/Chest: Effort normal and breath sounds normal. No stridor. No respiratory distress. He has no wheezes. He has no rales. He exhibits no tenderness.  Musculoskeletal: Normal range of motion.  Neurological: He is alert and oriented to person, place, and time.  Skin: Skin is warm, dry and intact. Capillary refill takes less than 2 seconds. No rash noted. He is not diaphoretic. No erythema. No pallor.  Psychiatric: He has a normal mood and affect. His speech is normal and behavior is normal. Judgment and thought content normal. Cognition and memory are normal.    Results for orders placed or performed in visit on 06/11/17  HM DIABETES EYE EXAM  Result Value Ref Range   HM Diabetic Eye Exam No Retinopathy No Retinopathy  Assessment & Plan:   Problem List Items Addressed This Visit      Cardiovascular and Mediastinum   HTN (hypertension)    Not under good control. Will increase to  and recheck in 1 month. Call with any concerns. Refills given.       Relevant Medications   losartan (COZAAR) 50 MG tablet   pravastatin (PRAVACHOL) 40 MG tablet   Other Relevant Orders   Comprehensive metabolic panel      Digestive   Gastroesophageal reflux disease with esophagitis    Under good control. Continue current regimen. Continue to monitor. Call with any concerns.         Endocrine   Diabetes mellitus type 2, diet-controlled (HCC)    Under good control with A1c of 5.8 off medicine. Continue current regimen. Continue to monitor. Call with any concerns.       Relevant Medications   losartan (COZAAR) 50 MG tablet   pravastatin (PRAVACHOL) 40 MG tablet   Other Relevant Orders   Bayer DCA Hb A1c Waived   Comprehensive metabolic panel     Other   HLD (hyperlipidemia) - Primary    Rechecking levels again today. Refills given. Call with any concerns.       Relevant Medications   losartan (COZAAR) 50 MG tablet   pravastatin (PRAVACHOL) 40 MG tablet   Other Relevant Orders   Comprehensive metabolic panel   Lipid Panel w/o Chol/HDL Ratio       Follow up plan: Return in about 1 month (around 07/16/2017) for REcheck BP.

## 2017-06-18 NOTE — Assessment & Plan Note (Signed)
Rechecking levels again today. Refills given. Call with any concerns.

## 2017-06-18 NOTE — Assessment & Plan Note (Signed)
Under good control with A1c of 5.8 off medicine. Continue current regimen. Continue to monitor. Call with any concerns.

## 2017-06-19 LAB — LIPID PANEL W/O CHOL/HDL RATIO
CHOLESTEROL TOTAL: 184 mg/dL (ref 100–199)
HDL: 54 mg/dL (ref >39)
LDL CALC: 106 mg/dL — AB (ref 0–99)
TRIGLYCERIDES: 121 mg/dL (ref 0–149)
VLDL Cholesterol Cal: 24 mg/dL (ref 5–40)

## 2017-06-19 LAB — COMPREHENSIVE METABOLIC PANEL
ALT: 26 IU/L (ref 0–44)
AST: 23 IU/L (ref 0–40)
Albumin/Globulin Ratio: 1.8 (ref 1.2–2.2)
Albumin: 4.3 g/dL (ref 3.5–5.5)
Alkaline Phosphatase: 95 IU/L (ref 39–117)
BILIRUBIN TOTAL: 0.4 mg/dL (ref 0.0–1.2)
BUN/Creatinine Ratio: 15 (ref 9–20)
BUN: 14 mg/dL (ref 6–24)
CHLORIDE: 102 mmol/L (ref 96–106)
CO2: 24 mmol/L (ref 20–29)
CREATININE: 0.92 mg/dL (ref 0.76–1.27)
Calcium: 9.4 mg/dL (ref 8.7–10.2)
GFR calc Af Amer: 116 mL/min/{1.73_m2} (ref >59)
GFR calc non Af Amer: 100 mL/min/{1.73_m2} (ref >59)
GLOBULIN, TOTAL: 2.4 g/dL (ref 1.5–4.5)
GLUCOSE: 98 mg/dL (ref 65–99)
Potassium: 4.1 mmol/L (ref 3.5–5.2)
SODIUM: 139 mmol/L (ref 134–144)
Total Protein: 6.7 g/dL (ref 6.0–8.5)

## 2017-08-03 ENCOUNTER — Encounter: Payer: Self-pay | Admitting: Family Medicine

## 2017-08-03 ENCOUNTER — Other Ambulatory Visit: Payer: Self-pay

## 2017-08-03 ENCOUNTER — Ambulatory Visit: Payer: BLUE CROSS/BLUE SHIELD | Admitting: Family Medicine

## 2017-08-03 VITALS — BP 128/74 | HR 76 | Temp 98.3°F | Ht 70.0 in | Wt 207.2 lb

## 2017-08-03 DIAGNOSIS — I10 Essential (primary) hypertension: Secondary | ICD-10-CM | POA: Diagnosis not present

## 2017-08-03 DIAGNOSIS — Z114 Encounter for screening for human immunodeficiency virus [HIV]: Secondary | ICD-10-CM | POA: Diagnosis not present

## 2017-08-03 DIAGNOSIS — M7710 Lateral epicondylitis, unspecified elbow: Secondary | ICD-10-CM | POA: Insufficient documentation

## 2017-08-03 MED ORDER — LOSARTAN POTASSIUM 50 MG PO TABS: 50.0000 mg | ORAL_TABLET | Freq: Every day | ORAL | 1 refills | Status: DC

## 2017-08-03 NOTE — Progress Notes (Signed)
BP 128/74 (BP Location: Left Arm, Cuff Size: Small)   Pulse 76   Temp 98.3 F (36.8 C) (Oral)   Ht 5\' 10"  (1.778 m)   Wt 207 lb 3.2 oz (94 kg)   SpO2 97%   BMI 29.73 kg/m    Subjective:    Patient ID: Stephen Mayo, male    DOB: 04/06/1970, 47 y.o.   MRN: [id number]  HPI: Stephen Mayo is a 47 y.o. male  Chief Complaint  Patient presents with  . Hypertension   HYPERTENSION Hypertension status: better  Satisfied with current treatment? yes Duration of hypertension: chronic BP monitoring frequency:  not checking BP medication side effects:  no Medication compliance: excellent compliance Previous BP meds: lisinopril Aspirin: no Recurrent headaches: no Visual changes: no Palpitations: no Dyspnea: no Chest pain: no Lower extremity edema: no Dizzy/lightheaded: no   Relevant past medical, surgical, family and social history reviewed and updated as indicated. Interim medical history since our last visit reviewed. Allergies and medications reviewed and updated.  Review of Systems  Constitutional: Negative.   Respiratory: Negative.   Cardiovascular: Negative.   Psychiatric/Behavioral: Negative.     Per HPI unless specifically indicated above     Objective:    BP 128/74 (BP Location: Left Arm, Cuff Size: Small)   Pulse 76   Temp 98.3 F (36.8 C) (Oral)   Ht 5\' 10"  (1.778 m)   Wt 207 lb 3.2 oz (94 kg)   SpO2 97%   BMI 29.73 kg/m   Wt Readings from Last 3 Encounters:  08/03/17 207 lb 3.2 oz (94 kg)  06/18/17 208 lb 14.4 oz (94.8 kg)  12/15/16 196 lb 9 oz (89.2 kg)    Physical Exam  Constitutional: He is oriented to person, place, and time. He appears well-developed and well-nourished. No distress.  HENT:  Head: Normocephalic and atraumatic.  Right Ear: Hearing normal.  Left Ear: Hearing normal.  Nose: Nose normal.  Eyes: Conjunctivae and lids are normal. Right eye exhibits no discharge. Left eye exhibits no discharge. No scleral icterus.    Cardiovascular: Normal rate, regular rhythm, normal heart sounds and intact distal pulses. Exam reveals no gallop and no friction rub.  No murmur heard. Pulmonary/Chest: Effort normal and breath sounds normal. No stridor. No respiratory distress. He has no wheezes. He has no rales. He exhibits no tenderness.  Musculoskeletal: Normal range of motion.  Neurological: He is alert and oriented to person, place, and time.  Skin: Skin is warm, dry and intact. Capillary refill takes less than 2 seconds. No rash noted. He is not diaphoretic. No erythema. No pallor.  Psychiatric: He has a normal mood and affect. His speech is normal and behavior is normal. Judgment and thought content normal. Cognition and memory are normal.  Nursing note and vitals reviewed.   Results for orders placed or performed in visit on 06/18/17  Bayer DCA Hb A1c Waived  Result Value Ref Range   HB A1C (BAYER DCA - WAIVED) 5.8 <7.0 %  Comprehensive metabolic panel  Result Value Ref Range   Glucose 98 65 - 99 mg/dL   BUN 14 6 - 24 mg/dL   Creatinine, Ser 0.860.92 0.76 - 1.27 mg/dL   GFR calc non Af Amer 100 >59 mL/min/1.73   GFR calc Af Amer 116 >59 mL/min/1.73   BUN/Creatinine Ratio 15 9 - 20   Sodium 139 134 - 144 mmol/L   Potassium 4.1 3.5 - 5.2 mmol/L   Chloride 102 96 -  106 mmol/L   CO2 24 20 - 29 mmol/L   Calcium 9.4 8.7 - 10.2 mg/dL   Total Protein 6.7 6.0 - 8.5 g/dL   Albumin 4.3 3.5 - 5.5 g/dL   Globulin, Total 2.4 1.5 - 4.5 g/dL   Albumin/Globulin Ratio 1.8 1.2 - 2.2   Bilirubin Total 0.4 0.0 - 1.2 mg/dL   Alkaline Phosphatase 95 39 - 117 IU/L   AST 23 0 - 40 IU/L   ALT 26 0 - 44 IU/L  Lipid Panel w/o Chol/HDL Ratio  Result Value Ref Range   Cholesterol, Total 184 100 - 199 mg/dL   Triglycerides 161 0 - 149 mg/dL   HDL 54 >09 mg/dL   VLDL Cholesterol Cal 24 5 - 40 mg/dL   LDL Calculated 604 (H) 0 - 99 mg/dL      Assessment & Plan:   Problem List Items Addressed This Visit      Cardiovascular and  Mediastinum   HTN (hypertension) - Primary    Under good control on recheck. Continue current regimen. Call with any concerns. Recheck 6 months.      Relevant Medications   losartan (COZAAR) 50 MG tablet   Other Relevant Orders   Basic metabolic panel    Other Visit Diagnoses    Screening for HIV without presence of risk factors       Labs drawn today. Await results.    Relevant Orders   HIV antibody       Follow up plan: Return in about 5 months (around 01/03/2018) for Physical.

## 2017-08-03 NOTE — Assessment & Plan Note (Signed)
Under good control on recheck. Continue current regimen. Call with any concerns. Recheck 6 months.

## 2017-08-04 LAB — BASIC METABOLIC PANEL
BUN/Creatinine Ratio: 12 (ref 9–20)
BUN: 11 mg/dL (ref 6–24)
CO2: 24 mmol/L (ref 20–29)
CREATININE: 0.93 mg/dL (ref 0.76–1.27)
Calcium: 9.5 mg/dL (ref 8.7–10.2)
Chloride: 99 mmol/L (ref 96–106)
GFR, EST AFRICAN AMERICAN: 114 mL/min/{1.73_m2} (ref >59)
GFR, EST NON AFRICAN AMERICAN: 99 mL/min/{1.73_m2} (ref >59)
GLUCOSE: 120 mg/dL — AB (ref 65–99)
Potassium: 4.2 mmol/L (ref 3.5–5.2)
SODIUM: 139 mmol/L (ref 134–144)

## 2017-08-04 LAB — HIV ANTIBODY (ROUTINE TESTING W REFLEX): HIV SCREEN 4TH GENERATION: NONREACTIVE

## 2018-01-25 ENCOUNTER — Other Ambulatory Visit: Payer: Self-pay | Admitting: Family Medicine

## 2018-02-10 ENCOUNTER — Encounter: Payer: Self-pay | Admitting: Family Medicine

## 2018-02-10 ENCOUNTER — Ambulatory Visit: Payer: BLUE CROSS/BLUE SHIELD | Admitting: Family Medicine

## 2018-02-10 VITALS — BP 132/82 | HR 74 | Temp 98.4°F | Ht 71.0 in | Wt 216.5 lb

## 2018-02-10 DIAGNOSIS — E119 Type 2 diabetes mellitus without complications: Secondary | ICD-10-CM

## 2018-02-10 DIAGNOSIS — I10 Essential (primary) hypertension: Secondary | ICD-10-CM

## 2018-02-10 DIAGNOSIS — K2100 Gastro-esophageal reflux disease with esophagitis, without bleeding: Secondary | ICD-10-CM

## 2018-02-10 DIAGNOSIS — E782 Mixed hyperlipidemia: Secondary | ICD-10-CM | POA: Diagnosis not present

## 2018-02-10 NOTE — Assessment & Plan Note (Signed)
Better on recheck. Continue current regimen. Continue to monitor. Call with any concerns.  

## 2018-02-10 NOTE — Assessment & Plan Note (Signed)
Stable with A1c of 6.3- continue current regimen. Continue to monitor. Call with any concerns.

## 2018-02-10 NOTE — Assessment & Plan Note (Signed)
Under good control on current regimen. Continue current regimen. Continue to monitor. Call with any concerns. Refills given.   

## 2018-02-10 NOTE — Progress Notes (Signed)
BP 132/82 (BP Location: Left Arm, Cuff Size: Normal)   Pulse 74   Temp 98.4 F (36.9 C) (Oral)   Ht 5\' 11"  (1.803 m)   Wt 216 lb 8 oz (98.2 kg)   SpO2 99%   BMI 30.20 kg/m    Subjective:    Patient ID: Stephen Mayo, male    DOB: Jun 01, 1970, 48 y.o.   MRN: [id number]  HPI: Stephen Mayo is a 48 y.o. male  Chief Complaint  Patient presents with  . Diabetes  . Hyperlipidemia  . Hypertension  . Gastroesophageal Reflux   DIABETES Hypoglycemic episodes:no Polydipsia/polyuria: no Visual disturbance: no Chest pain: no Paresthesias: no Glucose Monitoring: no  Accucheck frequency: Not Checking Taking Insulin?: no Blood Pressure Monitoring: not checking Retinal Examination: Up to Date Foot Exam: Up to Date Diabetic Education: Completed Pneumovax: Up to Date Influenza: Up to Date Aspirin: yes  HYPERTENSION / HYPERLIPIDEMIA Satisfied with current treatment? yes Duration of hypertension: chronic BP monitoring frequency: not checking BP medication side effects: no Past BP meds: losartan Duration of hyperlipidemia: chronic Cholesterol medication side effects: no Cholesterol supplements: none Past cholesterol medications: pravastatin Medication compliance: excellent compliance Aspirin: no Recent stressors: no Recurrent headaches: no Visual changes: no Palpitations: no Dyspnea: no Chest pain: no Lower extremity edema: no Dizzy/lightheaded: no  GERD GERD control status: controlled  Satisfied with current treatment? yes Medication side effects: no  Medication compliance: better Dysphagia: no Odynophagia:  no Hematemesis: no Blood in stool: no EGD: no    Relevant past medical, surgical, family and social history reviewed and updated as indicated. Interim medical history since our last visit reviewed. Allergies and medications reviewed and updated.  Review of Systems  Constitutional: Negative.   Respiratory: Negative.   Musculoskeletal: Negative.     Psychiatric/Behavioral: Negative.     Per HPI unless specifically indicated above     Objective:    BP 132/82 (BP Location: Left Arm, Cuff Size: Normal)   Pulse 74   Temp 98.4 F (36.9 C) (Oral)   Ht 5\' 11"  (1.803 m)   Wt 216 lb 8 oz (98.2 kg)   SpO2 99%   BMI 30.20 kg/m   Wt Readings from Last 3 Encounters:  02/10/18 216 lb 8 oz (98.2 kg)  08/03/17 207 lb 3.2 oz (94 kg)  06/18/17 208 lb 14.4 oz (94.8 kg)    Physical Exam Vitals signs and nursing note reviewed.  Constitutional:      General: He is not in acute distress.    Appearance: Normal appearance. He is not ill-appearing, toxic-appearing or diaphoretic.  HENT:     Head: Normocephalic and atraumatic.     Right Ear: External ear normal.     Left Ear: External ear normal.     Nose: Nose normal.     Mouth/Throat:     Mouth: Mucous membranes are moist.     Pharynx: Oropharynx is clear.  Eyes:     General: No scleral icterus.       Right eye: No discharge.        Left eye: No discharge.     Extraocular Movements: Extraocular movements intact.     Conjunctiva/sclera: Conjunctivae normal.     Pupils: Pupils are equal, round, and reactive to light.  Neck:     Musculoskeletal: Normal range of motion and neck supple.  Cardiovascular:     Rate and Rhythm: Normal rate and regular rhythm.     Pulses: Normal pulses.  Heart sounds: Normal heart sounds. No murmur. No friction rub. No gallop.   Pulmonary:     Effort: Pulmonary effort is normal. No respiratory distress.     Breath sounds: Normal breath sounds. No stridor. No wheezing, rhonchi or rales.  Chest:     Chest wall: No tenderness.  Musculoskeletal: Normal range of motion.  Skin:    General: Skin is warm and dry.     Capillary Refill: Capillary refill takes less than 2 seconds.     Coloration: Skin is not jaundiced or pale.     Findings: No bruising, erythema, lesion or rash.  Neurological:     General: No focal deficit present.     Mental Status: He is  alert and oriented to person, place, and time. Mental status is at baseline.  Psychiatric:        Mood and Affect: Mood normal.        Behavior: Behavior normal.        Thought Content: Thought content normal.        Judgment: Judgment normal.     Results for orders placed or performed in visit on 08/03/17  Basic metabolic panel  Result Value Ref Range   Glucose 120 (H) 65 - 99 mg/dL   BUN 11 6 - 24 mg/dL   Creatinine, Ser 2.23 0.76 - 1.27 mg/dL   GFR calc non Af Amer 99 >59 mL/min/1.73   GFR calc Af Amer 114 >59 mL/min/1.73   BUN/Creatinine Ratio 12 9 - 20   Sodium 139 134 - 144 mmol/L   Potassium 4.2 3.5 - 5.2 mmol/L   Chloride 99 96 - 106 mmol/L   CO2 24 20 - 29 mmol/L   Calcium 9.5 8.7 - 10.2 mg/dL  HIV antibody  Result Value Ref Range   HIV Screen 4th Generation wRfx Non Reactive Non Reactive      Assessment & Plan:   Problem List Items Addressed This Visit      Cardiovascular and Mediastinum   HTN (hypertension)    Better on recheck. Continue current regimen. Continue to monitor. Call with any concerns.       Relevant Orders   Comprehensive metabolic panel   Microalbumin, Urine Waived     Digestive   Gastroesophageal reflux disease with esophagitis    Under good control on current regimen. Continue current regimen. Continue to monitor. Call with any concerns. Refills given.        Relevant Orders   CBC with Differential/Platelet   Comprehensive metabolic panel     Endocrine   Diabetes mellitus type 2, diet-controlled (HCC) - Primary    Stable with A1c of 6.3- continue current regimen. Continue to monitor. Call with any concerns.       Relevant Orders   Bayer DCA Hb A1c Waived   Comprehensive metabolic panel   Microalbumin, Urine Waived     Other   HLD (hyperlipidemia)    Under good control on current regimen. Continue current regimen. Continue to monitor. Call with any concerns. Refills given.       Relevant Orders   Comprehensive metabolic  panel   Lipid Panel w/o Chol/HDL Ratio       Follow up plan: Return in about 6 months (around 08/11/2018) for Physical.

## 2018-02-11 LAB — CBC WITH DIFFERENTIAL/PLATELET
BASOS ABS: 0 10*3/uL (ref 0.0–0.2)
BASOS: 1 %
EOS (ABSOLUTE): 0.2 10*3/uL (ref 0.0–0.4)
EOS: 2 %
HEMATOCRIT: 42 % (ref 37.5–51.0)
HEMOGLOBIN: 14.6 g/dL (ref 13.0–17.7)
IMMATURE GRANS (ABS): 0 10*3/uL (ref 0.0–0.1)
Immature Granulocytes: 0 %
LYMPHS ABS: 1.9 10*3/uL (ref 0.7–3.1)
LYMPHS: 28 %
MCH: 31 pg (ref 26.6–33.0)
MCHC: 34.8 g/dL (ref 31.5–35.7)
MCV: 89 fL (ref 79–97)
MONOCYTES: 6 %
Monocytes Absolute: 0.4 10*3/uL (ref 0.1–0.9)
NEUTROS ABS: 4.3 10*3/uL (ref 1.4–7.0)
Neutrophils: 63 %
Platelets: 217 10*3/uL (ref 150–450)
RBC: 4.71 x10E6/uL (ref 4.14–5.80)
RDW: 12.3 % (ref 11.6–15.4)
WBC: 6.8 10*3/uL (ref 3.4–10.8)

## 2018-02-11 LAB — COMPREHENSIVE METABOLIC PANEL
ALBUMIN: 4.3 g/dL (ref 3.5–5.5)
ALT: 24 IU/L (ref 0–44)
AST: 18 IU/L (ref 0–40)
Albumin/Globulin Ratio: 2 (ref 1.2–2.2)
Alkaline Phosphatase: 113 IU/L (ref 39–117)
BUN / CREAT RATIO: 19 (ref 9–20)
BUN: 18 mg/dL (ref 6–24)
Bilirubin Total: 0.2 mg/dL (ref 0.0–1.2)
CO2: 23 mmol/L (ref 20–29)
CREATININE: 0.96 mg/dL (ref 0.76–1.27)
Calcium: 9.7 mg/dL (ref 8.7–10.2)
Chloride: 101 mmol/L (ref 96–106)
GFR calc non Af Amer: 94 mL/min/{1.73_m2} (ref >59)
GFR, EST AFRICAN AMERICAN: 109 mL/min/{1.73_m2} (ref >59)
GLOBULIN, TOTAL: 2.1 g/dL (ref 1.5–4.5)
GLUCOSE: 118 mg/dL — AB (ref 65–99)
Potassium: 4.3 mmol/L (ref 3.5–5.2)
SODIUM: 139 mmol/L (ref 134–144)
TOTAL PROTEIN: 6.4 g/dL (ref 6.0–8.5)

## 2018-02-11 LAB — LIPID PANEL W/O CHOL/HDL RATIO
Cholesterol, Total: 160 mg/dL (ref 100–199)
HDL: 51 mg/dL (ref >39)
LDL CALC: 79 mg/dL (ref 0–99)
Triglycerides: 150 mg/dL — ABNORMAL HIGH (ref 0–149)
VLDL Cholesterol Cal: 30 mg/dL (ref 5–40)

## 2018-02-11 LAB — MICROALBUMIN, URINE WAIVED
Creatinine, Urine Waived: 300 mg/dL (ref 10–300)
MICROALB, UR WAIVED: 10 mg/L (ref 0–19)
Microalb/Creat Ratio: <30 mg/g (ref <30)

## 2018-02-11 LAB — BAYER DCA HB A1C WAIVED: HB A1C: 6.3 % (ref <7.0)

## 2018-04-18 ENCOUNTER — Other Ambulatory Visit: Payer: Self-pay | Admitting: Family Medicine

## 2018-06-28 ENCOUNTER — Other Ambulatory Visit: Payer: Self-pay | Admitting: Family Medicine

## 2018-06-29 ENCOUNTER — Other Ambulatory Visit: Payer: Self-pay

## 2018-06-30 MED ORDER — RABEPRAZOLE SODIUM 20 MG PO TBEC: 20.0000 mg | DELAYED_RELEASE_TABLET | Freq: Every day | ORAL | 0 refills | Status: DC

## 2018-08-11 ENCOUNTER — Other Ambulatory Visit: Payer: Self-pay

## 2018-08-11 ENCOUNTER — Ambulatory Visit (INDEPENDENT_AMBULATORY_CARE_PROVIDER_SITE_OTHER): Payer: BC Managed Care – PPO | Admitting: Family Medicine

## 2018-08-11 ENCOUNTER — Encounter: Payer: Self-pay | Admitting: Family Medicine

## 2018-08-11 VITALS — BP 155/89 | HR 97 | Temp 98.5°F | Ht 69.5 in | Wt 205.0 lb

## 2018-08-11 DIAGNOSIS — I10 Essential (primary) hypertension: Secondary | ICD-10-CM

## 2018-08-11 DIAGNOSIS — E782 Mixed hyperlipidemia: Secondary | ICD-10-CM | POA: Diagnosis not present

## 2018-08-11 DIAGNOSIS — K2100 Gastro-esophageal reflux disease with esophagitis, without bleeding: Secondary | ICD-10-CM

## 2018-08-11 DIAGNOSIS — Z125 Encounter for screening for malignant neoplasm of prostate: Secondary | ICD-10-CM

## 2018-08-11 DIAGNOSIS — E119 Type 2 diabetes mellitus without complications: Secondary | ICD-10-CM

## 2018-08-11 DIAGNOSIS — Z0000 Encounter for general adult medical examination without abnormal findings: Secondary | ICD-10-CM

## 2018-08-11 MED ORDER — RABEPRAZOLE SODIUM 20 MG PO TBEC: 20.0000 mg | DELAYED_RELEASE_TABLET | Freq: Every day | ORAL | 1 refills | Status: DC

## 2018-08-11 MED ORDER — PRAVASTATIN SODIUM 40 MG PO TABS: 40.0000 mg | ORAL_TABLET | Freq: Every day | ORAL | 1 refills | Status: DC

## 2018-08-11 MED ORDER — LOSARTAN POTASSIUM 50 MG PO TABS: 50.0000 mg | ORAL_TABLET | Freq: Every day | ORAL | 1 refills | Status: DC

## 2018-08-11 NOTE — Assessment & Plan Note (Signed)
Under good control on current regimen. Continue current regimen. Continue to monitor. Call with any concerns. Refills given. Labs drawn today.   

## 2018-08-11 NOTE — Assessment & Plan Note (Addendum)
Stable with A1c of 6.4. Continue diet and exercise. Continue to monitor. Call with any concerns.

## 2018-08-11 NOTE — Assessment & Plan Note (Signed)
Slightly elevated today. Work on [employer]. Continue to monitor. Call with any concerns. Continue to monitor. Refills given today.

## 2018-08-11 NOTE — Progress Notes (Signed)
BP (!) 155/89   Pulse 97   Temp 98.5 F (36.9 C) (Oral)   Ht 5' 9.5" (1.765 m)   Wt 205 lb (93 kg)   SpO2 97%   BMI 29.84 kg/m    Subjective:    Patient ID: Stephen Mayo, male    DOB: December 18, 1970, 48 y.o.   MRN: [id number]  HPI: Stephen Mayo is a 48 y.o. male presenting on 08/11/2018 for comprehensive medical examination. Current medical complaints include:  HYPERTENSION / HYPERLIPIDEMIA Satisfied with current treatment? yes Duration of hypertension: chronic BP monitoring frequency: not checking BP medication side effects: no Past BP meds: losartan Duration of hyperlipidemia: chronic Cholesterol medication side effects: no Cholesterol supplements: none Past cholesterol medications: pravastatin Medication compliance: excellent compliance Aspirin: no Recent stressors: no Recurrent headaches: no Visual changes: no Palpitations: no Dyspnea: no Chest pain: no Lower extremity edema: no Dizzy/lightheaded: no  DIABETES Hypoglycemic episodes:no Polydipsia/polyuria: no Visual disturbance: no Chest pain: no Paresthesias: no Glucose Monitoring: no  Accucheck frequency: Not Checking Taking Insulin?: no Blood Pressure Monitoring: not checking Retinal Examination: Not up to Date Foot Exam: Up to Date Diabetic Education: Completed Pneumovax: Up to Date Influenza: Up to Date Aspirin: no  Interim Problems from his last visit: no  Depression Screen done today and results listed below:  [facility] 08/11/2018 02/10/2018 12/15/2016 12/03/2015 05/30/2015  Decreased Interest 0 0 0 0 0  Down, Depressed, Hopeless 0 0 0 0 0  PHQ - 2 Score 0 0 0 0 0  Altered sleeping 0 0 - - -  Tired, decreased energy 0 0 - - -  Change in appetite 0 0 - - -  Feeling bad or failure about yourself  0 0 - - -  Trouble concentrating 0 0 - - -  Moving slowly or fidgety/restless 0 0 - - -  Suicidal thoughts 0 0 - - -  PHQ-9 Score 0 0 - - -  Difficult doing work/chores Not  difficult at all Not difficult at all - - -    Past Medical History:  Past Medical History:  Diagnosis Date  . Allergy   . Back pain   . Diabetes mellitus without complication (HCC)   . GERD (gastroesophageal reflux disease)   . Hyperlipidemia   . Hypertension     Surgical History:  Past Surgical History:  Procedure Laterality Date  . COLONOSCOPY WITH PROPOFOL N/A 10/25/2015   Procedure: COLONOSCOPY WITH PROPOFOL;  Surgeon: Scot Junobert T Elliott, MD;  [facility];  Service: Endoscopy;  Laterality: N/A;  . HAND SURGERY Right 2012   fracture- 5 pins and reconstruction  . HERNIA REPAIR  2002   Double  . NASAL SINUS SURGERY  2008   Maxillary Sinus Removed  . STERIOD INJECTION    . ulcer on his larynx      Medications:  Current Outpatient Medications on File Prior to Visit  Medication Sig  . glucose blood (ONE TOUCH ULTRA TEST) test strip USE TO TEST DAILY (Patient not taking: Reported on 08/11/2018)  . ONETOUCH DELICA LANCETS FINE MISC USE TO TEST DAILY (Patient not taking: Reported on 08/11/2018)   No current facility-administered medications on file prior to visit.     Allergies:  Allergies  Allergen Reactions  . Lisinopril Cough    Social History:  Social History   Socioeconomic History  . Marital status: Married    Spouse name: Not on file  . Number of children: Not on file  .  Years of education: Not on file  . Highest education level: Not on file  Occupational History  . Not on file  Social Needs  . Financial resource strain: Not on file  . Food insecurity    Worry: Not on file    Inability: Not on file  . Transportation needs    Medical: Not on file    Non-medical: Not on file  Tobacco Use  . Smoking status: Former Smoker    Quit date: 06/25/2005    Years since quitting: 13.1  . Smokeless tobacco: Former Engineer, waterUser  Substance and Sexual Activity  . Alcohol use: Yes    Alcohol/week: 12.0 standard drinks    Types: 10 Cans of beer, 2 Shots of liquor  per week  . Drug use: No  . Sexual activity: Yes  Lifestyle  . Physical activity    Days per week: Not on file    Minutes per session: Not on file  . Stress: Not on file  Relationships  . Social Musicianconnections    Talks on phone: Not on file    Gets together: Not on file    Attends religious service: Not on file    Active member of club or organization: Not on file    Attends meetings of clubs or organizations: Not on file    Relationship status: Not on file  . Intimate partner violence    Fear of current or ex partner: Not on file    Emotionally abused: Not on file    Physically abused: Not on file    Forced sexual activity: Not on file  Other Topics Concern  . Not on file  Social History Narrative  . Not on file   Social History   Tobacco Use  Smoking Status Former Smoker  . Quit date: 06/25/2005  . Years since quitting: 13.1  Smokeless Tobacco Former NeurosurgeonUser   Social History   Substance and Sexual Activity  Alcohol Use Yes  . Alcohol/week: 12.0 standard drinks  . Types: 10 Cans of beer, 2 Shots of liquor per week    Family History:  Family History  Problem Relation Age of Onset  . Cancer Mother        Lung  . Cancer Father        colon  . Cancer Maternal Grandmother        breast, brain and colon  . Lymphoma Paternal Grandmother   . Cancer Paternal Grandfather        lung and throat    Past medical history, surgical history, medications, allergies, family history and social history reviewed with patient today and changes made to appropriate areas of the chart.   Review of Systems  Constitutional: Negative.   HENT: Negative.   Eyes: Negative.   Respiratory: Positive for cough (first thing in the AM). Negative for hemoptysis, sputum production, shortness of breath and wheezing.   Cardiovascular: Negative.   Gastrointestinal: Negative.   Genitourinary: Negative.   Musculoskeletal: Negative.   Skin: Negative.   Endo/Heme/Allergies: Positive for environmental  allergies. Negative for polydipsia. Does not bruise/bleed easily.    All other ROS negative except what is listed above and in the HPI.      Objective:    BP (!) 155/89   Pulse 97   Temp 98.5 F (36.9 C) (Oral)   Ht 5' 9.5" (1.765 m)   Wt 205 lb (93 kg)   SpO2 97%   BMI 29.84 kg/m   Wt Readings from Last 3  Encounters:  08/11/18 205 lb (93 kg)  02/10/18 216 lb 8 oz (98.2 kg)  08/03/17 207 lb 3.2 oz (94 kg)    Physical Exam Vitals signs and nursing note reviewed.  Constitutional:      General: He is not in acute distress.    Appearance: Normal appearance. He is obese. He is not ill-appearing, toxic-appearing or diaphoretic.  HENT:     Head: Normocephalic and atraumatic.     Right Ear: Tympanic membrane, ear canal and external ear normal. There is no impacted cerumen.     Left Ear: Tympanic membrane, ear canal and external ear normal. There is no impacted cerumen.     Nose: Nose normal. No congestion or rhinorrhea.     Mouth/Throat:     Mouth: Mucous membranes are moist.     Pharynx: Oropharynx is clear. No oropharyngeal exudate or posterior oropharyngeal erythema.  Eyes:     General: No scleral icterus.       Right eye: No discharge.        Left eye: No discharge.     Extraocular Movements: Extraocular movements intact.     Conjunctiva/sclera: Conjunctivae normal.     Pupils: Pupils are equal, round, and reactive to light.  Neck:     Musculoskeletal: Normal range of motion and neck supple. No neck rigidity or muscular tenderness.     Vascular: No carotid bruit.  Cardiovascular:     Rate and Rhythm: Normal rate and regular rhythm.     Pulses: Normal pulses.     Heart sounds: No murmur. No friction rub. No gallop.   Pulmonary:     Effort: Pulmonary effort is normal. No respiratory distress.     Breath sounds: Normal breath sounds. No stridor. No wheezing, rhonchi or rales.  Chest:     Chest wall: No tenderness.  Abdominal:     General: Abdomen is flat. Bowel sounds  are normal. There is no distension.     Palpations: Abdomen is soft. There is no mass.     Tenderness: There is no abdominal tenderness. There is no right CVA tenderness, left CVA tenderness, guarding or rebound.     Hernia: No hernia is present.  Genitourinary:    Comments: Genital exam deferred with shared decision making Musculoskeletal:        General: No swelling, tenderness, deformity or signs of injury.     Right lower leg: No edema.     Left lower leg: No edema.  Lymphadenopathy:     Cervical: No cervical adenopathy.  Skin:    General: Skin is warm and dry.     Capillary Refill: Capillary refill takes less than 2 seconds.     Coloration: Skin is not jaundiced or pale.     Findings: No bruising, erythema, lesion or rash.  Neurological:     General: No focal deficit present.     Mental Status: He is alert and oriented to person, place, and time.     Cranial Nerves: No cranial nerve deficit.     Sensory: No sensory deficit.     Motor: No weakness.     Coordination: Coordination normal.     Gait: Gait normal.     Deep Tendon Reflexes: Reflexes normal.  Psychiatric:        Mood and Affect: Mood normal.        Behavior: Behavior normal.        Thought Content: Thought content normal.        Judgment:  Judgment normal.     Results for orders placed or performed in visit on 02/10/18  Bayer DCA Hb A1c Waived  Result Value Ref Range   HB A1C (BAYER DCA - WAIVED) 6.3 <7.0 %  CBC with Differential/Platelet  Result Value Ref Range   WBC 6.8 3.4 - 10.8 x10E3/uL   RBC 4.71 4.14 - 5.80 x10E6/uL   Hemoglobin 14.6 13.0 - 17.7 g/dL   Hematocrit 63.8 75.6 - 51.0 %   MCV 89 79 - 97 fL   MCH 31.0 26.6 - 33.0 pg   MCHC 34.8 31.5 - 35.7 g/dL   RDW 43.3 29.5 - 18.8 %   Platelets 217 150 - 450 x10E3/uL   Neutrophils 63 Not Estab. %   Lymphs 28 Not Estab. %   Monocytes 6 Not Estab. %   Eos 2 Not Estab. %   Basos 1 Not Estab. %   Neutrophils Absolute 4.3 1.4 - 7.0 x10E3/uL    Lymphocytes Absolute 1.9 0.7 - 3.1 x10E3/uL   Monocytes Absolute 0.4 0.1 - 0.9 x10E3/uL   EOS (ABSOLUTE) 0.2 0.0 - 0.4 x10E3/uL   Basophils Absolute 0.0 0.0 - 0.2 x10E3/uL   Immature Granulocytes 0 Not Estab. %   Immature Grans (Abs) 0.0 0.0 - 0.1 x10E3/uL  Comprehensive metabolic panel  Result Value Ref Range   Glucose 118 (H) 65 - 99 mg/dL   BUN 18 6 - 24 mg/dL   Creatinine, Ser 4.16 0.76 - 1.27 mg/dL   GFR calc non Af Amer 94 >59 mL/min/1.73   GFR calc Af Amer 109 >59 mL/min/1.73   BUN/Creatinine Ratio 19 9 - 20   Sodium 139 134 - 144 mmol/L   Potassium 4.3 3.5 - 5.2 mmol/L   Chloride 101 96 - 106 mmol/L   CO2 23 20 - 29 mmol/L   Calcium 9.7 8.7 - 10.2 mg/dL   Total Protein 6.4 6.0 - 8.5 g/dL   Albumin 4.3 3.5 - 5.5 g/dL   Globulin, Total 2.1 1.5 - 4.5 g/dL   Albumin/Globulin Ratio 2.0 1.2 - 2.2   Bilirubin Total 0.2 0.0 - 1.2 mg/dL   Alkaline Phosphatase 113 39 - 117 IU/L   AST 18 0 - 40 IU/L   ALT 24 0 - 44 IU/L  Lipid Panel w/o Chol/HDL Ratio  Result Value Ref Range   Cholesterol, Total 160 100 - 199 mg/dL   Triglycerides 606 (H) 0 - 149 mg/dL   HDL 51 >30 mg/dL   VLDL Cholesterol Cal 30 5 - 40 mg/dL   LDL Calculated 79 0 - 99 mg/dL  Microalbumin, Urine Waived  Result Value Ref Range   Microalb, Ur Waived 10 0 - 19 mg/L   Creatinine, Urine Waived 300 10 - 300 mg/dL   Microalb/Creat Ratio <30 <30 mg/g      Assessment & Plan:   Problem List Items Addressed This Visit      Cardiovascular and Mediastinum   HTN (hypertension)    Slightly elevated today. Work on [employer]. Continue to monitor. Call with any concerns. Continue to monitor. Refills given today.       Relevant Medications   pravastatin (PRAVACHOL) 40 MG tablet   losartan (COZAAR) 50 MG tablet   Other Relevant Orders   Comprehensive metabolic panel   Microalbumin, Urine Waived   TSH     Digestive   Gastroesophageal reflux disease with esophagitis    Under good control on current regimen.  Continue current regimen. Continue to monitor. Call with any  concerns. Refills given. Labs drawn today.       Relevant Orders   CBC with Differential/Platelet   Comprehensive metabolic panel     Endocrine   Diabetes mellitus type 2, diet-controlled (HCC)    Stable with A1c of 6.4. Continue diet and exercise. Continue to monitor. Call with any concerns.       Relevant Medications   pravastatin (PRAVACHOL) 40 MG tablet   losartan (COZAAR) 50 MG tablet   Other Relevant Orders   Bayer DCA Hb A1c Waived   Comprehensive metabolic panel   Microalbumin, Urine Waived   UA/M w/rflx Culture, Routine     Other   HLD (hyperlipidemia)    Under good control on current regimen. Continue current regimen. Continue to monitor. Call with any concerns. Refills given. Labs drawn today.       Relevant Medications   pravastatin (PRAVACHOL) 40 MG tablet   losartan (COZAAR) 50 MG tablet   Other Relevant Orders   Comprehensive metabolic panel   Lipid Panel w/o Chol/HDL Ratio    Other Visit Diagnoses    Routine general medical examination at a health care facility    -  Primary   Vaccines up to date. Screening labs checked today. Colonoscopy up to date. Continue diet and exercise. Call with any concerns.    Screening for prostate cancer       Labs drawn today. Await results. Call with any concerns.    Relevant Orders   PSA      LABORATORY TESTING:  Health maintenance labs ordered today as discussed above.   The natural history of prostate cancer and ongoing controversy regarding screening and potential treatment outcomes of prostate cancer has been discussed with the patient. The meaning of a false positive PSA and a false negative PSA has been discussed. He indicates understanding of the limitations of this screening test and wishes to proceed with screening PSA testing.   IMMUNIZATIONS:   - Tdap: Tetanus vaccination status reviewed: last tetanus booster within 10 years. - Influenza: Up to  date - Pneumovax: Up to date  PATIENT COUNSELING:    Sexuality: Discussed sexually transmitted diseases, partner selection, use of condoms, avoidance of unintended pregnancy  and contraceptive alternatives.   Advised to avoid cigarette smoking.  I discussed with the patient that most people either abstain from alcohol or drink within safe limits (<=14/week and <=4 drinks/occasion for males, <=7/weeks and <= 3 drinks/occasion for females) and that the risk for alcohol disorders and other health effects rises proportionally with the number of drinks per week and how often a drinker exceeds daily limits.  Discussed cessation/primary prevention of drug use and availability of treatment for abuse.   Diet: Encouraged to adjust caloric intake to maintain  or achieve ideal body weight, to reduce intake of dietary saturated fat and total fat, to limit sodium intake by avoiding high sodium foods and not adding table salt, and to maintain adequate dietary potassium and calcium preferably from fresh fruits, vegetables, and low-fat dairy products.    stressed the importance of regular exercise  Injury prevention: Discussed safety belts, safety helmets, smoke detector, smoking near bedding or upholstery.   Dental health: Discussed importance of regular tooth brushing, flossing, and dental visits.   Follow up plan: NEXT PREVENTATIVE PHYSICAL DUE IN 1 YEAR. Return in about 6 months (around 02/11/2019).

## 2018-08-12 LAB — CBC WITH DIFFERENTIAL/PLATELET
Basophils Absolute: 0.1 10*3/uL (ref 0.0–0.2)
Basos: 1 %
EOS (ABSOLUTE): 0.1 10*3/uL (ref 0.0–0.4)
Eos: 1 %
Hematocrit: 46.6 % (ref 37.5–51.0)
Hemoglobin: 15.9 g/dL (ref 13.0–17.7)
Immature Grans (Abs): 0 10*3/uL (ref 0.0–0.1)
Immature Granulocytes: 0 %
Lymphocytes Absolute: 1.6 10*3/uL (ref 0.7–3.1)
Lymphs: 18 %
MCH: 30.3 pg (ref 26.6–33.0)
MCHC: 34.1 g/dL (ref 31.5–35.7)
MCV: 89 fL (ref 79–97)
Monocytes Absolute: 0.5 10*3/uL (ref 0.1–0.9)
Monocytes: 6 %
Neutrophils Absolute: 6.5 10*3/uL (ref 1.4–7.0)
Neutrophils: 74 %
Platelets: 223 10*3/uL (ref 150–450)
RBC: 5.25 x10E6/uL (ref 4.14–5.80)
RDW: 12.4 % (ref 11.6–15.4)
WBC: 8.9 10*3/uL (ref 3.4–10.8)

## 2018-08-12 LAB — MICROALBUMIN, URINE WAIVED
Creatinine, Urine Waived: 200 mg/dL (ref 10–300)
Microalb, Ur Waived: 10 mg/L (ref 0–19)
Microalb/Creat Ratio: <30 mg/g (ref <30)

## 2018-08-12 LAB — UA/M W/RFLX CULTURE, ROUTINE
Bilirubin, UA: NEGATIVE
Glucose, UA: NEGATIVE
Leukocytes,UA: NEGATIVE
Nitrite, UA: NEGATIVE
Protein,UA: NEGATIVE
RBC, UA: NEGATIVE
Specific Gravity, UA: 1.025 (ref 1.005–1.030)
Urobilinogen, Ur: 0.2 mg/dL (ref 0.2–1.0)
pH, UA: 5.5 (ref 5.0–7.5)

## 2018-08-12 LAB — COMPREHENSIVE METABOLIC PANEL
ALT: 26 IU/L (ref 0–44)
AST: 25 IU/L (ref 0–40)
Albumin/Globulin Ratio: 1.8 (ref 1.2–2.2)
Albumin: 4.6 g/dL (ref 4.0–5.0)
Alkaline Phosphatase: 103 IU/L (ref 39–117)
BUN/Creatinine Ratio: 14 (ref 9–20)
BUN: 13 mg/dL (ref 6–24)
Bilirubin Total: 0.5 mg/dL (ref 0.0–1.2)
CO2: 23 mmol/L (ref 20–29)
Calcium: 9.7 mg/dL (ref 8.7–10.2)
Chloride: 97 mmol/L (ref 96–106)
Creatinine, Ser: 0.96 mg/dL (ref 0.76–1.27)
GFR calc Af Amer: 109 mL/min/{1.73_m2} (ref >59)
GFR calc non Af Amer: 94 mL/min/{1.73_m2} (ref >59)
Globulin, Total: 2.5 g/dL (ref 1.5–4.5)
Glucose: 117 mg/dL — ABNORMAL HIGH (ref 65–99)
Potassium: 4.5 mmol/L (ref 3.5–5.2)
Sodium: 137 mmol/L (ref 134–144)
Total Protein: 7.1 g/dL (ref 6.0–8.5)

## 2018-08-12 LAB — TSH: TSH: 1.58 u[IU]/mL (ref 0.450–4.500)

## 2018-08-12 LAB — PSA: Prostate Specific Ag, Serum: 0.2 ng/mL (ref 0.0–4.0)

## 2018-08-12 LAB — MICROSCOPIC EXAMINATION
Bacteria, UA: NONE SEEN
Epithelial Cells (non renal): NONE SEEN /hpf (ref 0–10)
RBC, Urine: NONE SEEN /hpf (ref 0–2)
WBC, UA: NONE SEEN /hpf (ref 0–5)

## 2018-08-12 LAB — LIPID PANEL W/O CHOL/HDL RATIO
Cholesterol, Total: 178 mg/dL (ref 100–199)
HDL: 56 mg/dL (ref >39)
LDL Calculated: 77 mg/dL (ref 0–99)
Triglycerides: 227 mg/dL — ABNORMAL HIGH (ref 0–149)
VLDL Cholesterol Cal: 45 mg/dL — ABNORMAL HIGH (ref 5–40)

## 2018-08-12 LAB — BAYER DCA HB A1C WAIVED: HB A1C (BAYER DCA - WAIVED): 6.4 % (ref <7.0)

## 2018-11-03 LAB — HM DIABETES EYE EXAM

## 2019-02-14 ENCOUNTER — Other Ambulatory Visit: Payer: Self-pay

## 2019-02-14 ENCOUNTER — Ambulatory Visit: Payer: BC Managed Care – PPO | Admitting: Family Medicine

## 2019-02-14 ENCOUNTER — Encounter: Payer: Self-pay | Admitting: Family Medicine

## 2019-02-14 VITALS — BP 137/83 | HR 77 | Temp 99.2°F

## 2019-02-14 DIAGNOSIS — H8112 Benign paroxysmal vertigo, left ear: Secondary | ICD-10-CM

## 2019-02-14 DIAGNOSIS — E782 Mixed hyperlipidemia: Secondary | ICD-10-CM

## 2019-02-14 DIAGNOSIS — E119 Type 2 diabetes mellitus without complications: Secondary | ICD-10-CM

## 2019-02-14 DIAGNOSIS — K2100 Gastro-esophageal reflux disease with esophagitis, without bleeding: Secondary | ICD-10-CM

## 2019-02-14 DIAGNOSIS — I10 Essential (primary) hypertension: Secondary | ICD-10-CM

## 2019-02-14 LAB — BAYER DCA HB A1C WAIVED: HB A1C (BAYER DCA - WAIVED): 6.3 % (ref <7.0)

## 2019-02-14 MED ORDER — LOSARTAN POTASSIUM 50 MG PO TABS: 50.0000 mg | ORAL_TABLET | Freq: Every day | ORAL | 1 refills | Status: DC

## 2019-02-14 MED ORDER — PREDNISONE 50 MG PO TABS: 50.0000 mg | ORAL_TABLET | Freq: Every day | ORAL | 0 refills | Status: DC

## 2019-02-14 MED ORDER — RABEPRAZOLE SODIUM 20 MG PO TBEC: 20.0000 mg | DELAYED_RELEASE_TABLET | Freq: Every day | ORAL | 1 refills | Status: DC

## 2019-02-14 MED ORDER — PRAVASTATIN SODIUM 40 MG PO TABS: 40.0000 mg | ORAL_TABLET | Freq: Every day | ORAL | 1 refills | Status: DC

## 2019-02-14 NOTE — Assessment & Plan Note (Signed)
Under good control on current regimen. Continue current regimen. Continue to monitor. Call with any concerns. Refills given. Labs drawn today.   

## 2019-02-14 NOTE — Patient Instructions (Signed)
How to Perform the Epley Maneuver °The Epley maneuver is an exercise that relieves symptoms of vertigo. Vertigo is the feeling that you or your surroundings are moving when they are not. When you feel vertigo, you may feel like the room is spinning and have trouble walking. Dizziness is a little different than vertigo. When you are dizzy, you may feel unsteady or light-headed. °You can do this maneuver at home whenever you have symptoms of vertigo. You can do it up to 3 times a day until your symptoms go away. °Even though the Epley maneuver may relieve your vertigo for a few weeks, it is possible that your symptoms will return. This maneuver relieves vertigo, but it does not relieve dizziness. °What are the risks? °If it is done correctly, the Epley maneuver is considered safe. Sometimes it can lead to dizziness or nausea that goes away after a short time. If you develop other symptoms, such as changes in vision, weakness, or numbness, stop doing the maneuver and call your health care provider. °How to perform the Epley maneuver °1. Sit on the edge of a bed or table with your back straight and your legs extended or hanging over the edge of the bed or table. °2. Turn your head halfway toward the affected ear or side. °3. Lie backward quickly with your head turned until you are lying flat on your back. You may want to position a pillow under your shoulders. °4. Hold this position for 30 seconds. You may experience an attack of vertigo. This is normal. °5. Turn your head to the opposite direction until your unaffected ear is facing the floor. °6. Hold this position for 30 seconds. You may experience an attack of vertigo. This is normal. Hold this position until the vertigo stops. °7. Turn your whole body to the same side as your head. Hold for another 30 seconds. °8. Sit back up. °You can repeat this exercise up to 3 times a day. °Follow these instructions at home: °· After doing the Epley maneuver, you can return to  your normal activities. °· Ask your health care provider if there is anything you should do at home to prevent vertigo. He or she may recommend that you: °? Keep your head raised (elevated) with two or more pillows while you sleep. °? Do not sleep on the side of your affected ear. °? Get up slowly from bed. °? Avoid sudden movements during the day. °? Avoid extreme head movement, like looking up or bending over. °Contact a health care provider if: °· Your vertigo gets worse. °· You have other symptoms, including: °? Nausea. °? Vomiting. °? Headache. °Get help right away if: °· You have vision changes. °· You have a severe or worsening headache or neck pain. °· You cannot stop vomiting. °· You have new numbness or weakness in any part of your body. °Summary °· Vertigo is the feeling that you or your surroundings are moving when they are not. °· The Epley maneuver is an exercise that relieves symptoms of vertigo. °· If the Epley maneuver is done correctly, it is considered safe. You can do it up to 3 times a day. °This information is not intended to replace advice given to you by your health care provider. Make sure you discuss any questions you have with your health care provider. °Document Revised: 12/25/2016 Document Reviewed: 12/03/2015 °Elsevier Patient Education © 2020 [employer] °Benign Positional Vertigo °Vertigo is the feeling that you or your surroundings are moving when   they are not. Benign positional vertigo is the most common form of vertigo. This is usually a harmless condition (benign). This condition is positional. This means that symptoms are triggered by certain movements and positions. °This condition can be dangerous if it occurs while you are doing something that could cause harm to you or others. This includes activities such as driving or operating machinery. °What are the causes? °In many cases, the cause of this condition is not known. It may be caused by a disturbance in an area of the  inner ear that helps your brain to sense movement and balance. This disturbance can be caused by: °· Viral infection (labyrinthitis). °· Head injury. °· Repetitive motion, such as jumping, dancing, or running. °What increases the risk? °You are more likely to develop this condition if: °· You are a woman. °· You are 48 years of age or older. °What are the signs or symptoms? °Symptoms of this condition usually happen when you move your head or your eyes in different directions. Symptoms may start suddenly, and usually last for less than a minute. They include: °· Loss of balance and falling. °· Feeling like you are spinning or moving. °· Feeling like your surroundings are spinning or moving. °· Nausea and vomiting. °· Blurred vision. °· Dizziness. °· Involuntary eye movement (nystagmus). °Symptoms can be mild and cause only minor problems, or they can be severe and interfere with daily life. Episodes of benign positional vertigo may return (recur) over time. Symptoms may improve over time. °How is this diagnosed? °This condition may be diagnosed based on: °· Your medical history. °· Physical exam of the head, neck, and ears. °· Tests, such as: °? MRI. °? CT scan. °? Eye movement tests. Your health care provider may ask you to change positions quickly while he or she watches you for symptoms of benign positional vertigo, such as nystagmus. Eye movement may be tested with a variety of exams that are designed to evaluate or stimulate vertigo. °? An electroencephalogram (EEG). This records electrical activity in your brain. °? Hearing tests. °You may be referred to a health care provider who specializes in ear, nose, and throat (ENT) problems (otolaryngologist) or a provider who specializes in disorders of the nervous system (neurologist). °How is this treated? ° °This condition may be treated in a session in which your health care provider moves your head in specific positions to adjust your inner ear back to normal.  Treatment for this condition may take several sessions. Surgery may be needed in severe cases, but this is rare.  °In some cases, benign positional vertigo may resolve on its own in 2-4 weeks. °Follow these instructions at home: °Safety °· Move slowly. Avoid sudden body or head movements or certain positions, as told by your health care provider. °· Avoid driving until your health care provider says it is safe for you to do so. °· Avoid operating heavy machinery until your health care provider says it is safe for you to do so. °· Avoid doing any tasks that would be dangerous to you or others if vertigo occurs. °· If you have trouble walking or keeping your balance, try using a cane for stability. If you feel dizzy or unstable, sit down right away. °· Return to your normal activities as told by your health care provider. Ask your health care provider what activities are safe for you. °General instructions °· Take over-the-counter and prescription medicines only as told by your health care provider. °· Drink enough   fluid to keep your urine pale yellow. °· Keep all follow-up visits as told by your health care provider. This is important. °Contact a health care provider if: °· You have a fever. °· Your condition gets worse or you develop new symptoms. °· Your family or friends notice any behavioral changes. °· You have nausea or vomiting that gets worse. °· You have numbness or a "pins and needles" sensation. °Get help right away if you: °· Have difficulty speaking or moving. °· Are always dizzy. °· Faint. °· Develop severe headaches. °· Have weakness in your legs or arms. °· Have changes in your hearing or vision. °· Develop a stiff neck. °· Develop sensitivity to light. °Summary °· Vertigo is the feeling that you or your surroundings are moving when they are not. Benign positional vertigo is the most common form of vertigo. °· The cause of this condition is not known. It may be caused by a disturbance in an area of  the inner ear that helps your brain to sense movement and balance. °· Symptoms include loss of balance and falling, feeling that you or your surroundings are moving, nausea and vomiting, and blurred vision. °· This condition can be diagnosed based on symptoms, physical exam, and other tests, such as MRI, CT scan, eye movement tests, and hearing tests. °· Follow safety instructions as told by your health care provider. You will also be told when to contact your health care provider in case of problems. °This information is not intended to replace advice given to you by your health care provider. Make sure you discuss any questions you have with your health care provider. °Document Revised: 06/23/2017 Document Reviewed: 06/23/2017 °Elsevier Patient Education © 2020 [employer] ° °

## 2019-02-14 NOTE — Assessment & Plan Note (Signed)
Under good control with A1c of 6.3- continue diet and exercise. Continue to monitor. Recheck 6 months. Call with any concerns.

## 2019-02-14 NOTE — Progress Notes (Signed)
BP 137/83   Pulse 77   Temp 99.2 F (37.3 C) (Oral)   SpO2 98%    Subjective:    Patient ID: Stephen Mayo, male    DOB: 04/05/1970, 49 y.o.   MRN: [id number]  HPI: Stephen Mayo is a 49 y.o. male  Chief Complaint  Patient presents with  . Diabetes  . Hyperlipidemia  . Hypertension   DIZZINESS Duration: 3-4 months Description of symptoms: off kilter Duration of episode: minutes Dizziness frequency: recurrent- couple of times a day Provoking factors: none Aggravating factors:  none Triggered by rolling over in bed: no Triggered by bending over: no Aggravated by head movement: no Aggravated by exertion, coughing, loud noises: no Recent head injury: no Recent or current viral symptoms: no History of vasovagal episodes: no Nausea: no Vomiting: no Tinnitus: no Hearing loss: no Aural fullness: yes Headache: no Photophobia/phonophobia: no Unsteady gait: no Postural instability: yes Diplopia, dysarthria, dysphagia or weakness: no Related to exertion: no Pallor: no Diaphoresis: no Dyspnea: no Chest pain: no  HYPERTENSION / HYPERLIPIDEMIA Satisfied with current treatment? yes Duration of hypertension: chronic BP monitoring frequency: not checking BP medication side effects: no Past BP meds: losartan Duration of hyperlipidemia: chronic Cholesterol medication side effects: no Cholesterol supplements: none Past cholesterol medications: pravastatin Medication compliance: excellent compliance Aspirin: no Recent stressors: no Recurrent headaches: no Visual changes: no Palpitations: no Dyspnea: no Chest pain: no Lower extremity edema: no Dizzy/lightheaded: no  DIABETES Hypoglycemic episodes:no Polydipsia/polyuria: no Visual disturbance: no Chest pain: no Paresthesias: no Glucose Monitoring: no  Accucheck frequency: Not Checking Taking Insulin?: no Blood Pressure Monitoring: not checking Retinal Examination: Not up to Date Foot Exam: Up to  Date Diabetic Education: Completed Pneumovax: Up to Date Influenza: Up to Date Aspirin: no  Relevant past medical, surgical, family and social history reviewed and updated as indicated. Interim medical history since our last visit reviewed. Allergies and medications reviewed and updated.  Review of Systems  Constitutional: Negative.   HENT: Positive for congestion and sinus pressure. Negative for dental problem, drooling, ear discharge, ear pain, facial swelling, hearing loss, mouth sores, nosebleeds, postnasal drip, rhinorrhea, sinus pain, sneezing, sore throat, tinnitus, trouble swallowing and voice change.   Respiratory: Negative.   Cardiovascular: Negative.   Musculoskeletal: Negative.   Neurological: Positive for dizziness. Negative for tremors, seizures, syncope, facial asymmetry, speech difficulty, weakness, light-headedness, numbness and headaches.  Psychiatric/Behavioral: Negative.     Per HPI unless specifically indicated above     Objective:    BP 137/83   Pulse 77   Temp 99.2 F (37.3 C) (Oral)   SpO2 98%   Wt Readings from Last 3 Encounters:  08/11/18 205 lb (93 kg)  02/10/18 216 lb 8 oz (98.2 kg)  08/03/17 207 lb 3.2 oz (94 kg)    Physical Exam Vitals and nursing note reviewed.  Constitutional:      General: He is not in acute distress.    Appearance: Normal appearance. He is not ill-appearing, toxic-appearing or diaphoretic.  HENT:     Head: Normocephalic and atraumatic.     Right Ear: External ear normal.     Left Ear: External ear normal.     Nose: Nose normal.     Mouth/Throat:     Mouth: Mucous membranes are moist.     Pharynx: Oropharynx is clear.  Eyes:     General: No scleral icterus.       Right eye: No discharge.  Left eye: No discharge.     Extraocular Movements: Extraocular movements intact.     Conjunctiva/sclera: Conjunctivae normal.     Pupils: Pupils are equal, round, and reactive to light.  Cardiovascular:     Rate and  Rhythm: Normal rate and regular rhythm.     Pulses: Normal pulses.     Heart sounds: Normal heart sounds. No murmur. No friction rub. No gallop.   Pulmonary:     Effort: Pulmonary effort is normal. No respiratory distress.     Breath sounds: Normal breath sounds. No stridor. No wheezing, rhonchi or rales.  Chest:     Chest wall: No tenderness.  Musculoskeletal:        General: Normal range of motion.     Cervical back: Normal range of motion and neck supple.  Skin:    General: Skin is warm and dry.     Capillary Refill: Capillary refill takes less than 2 seconds.     Coloration: Skin is not jaundiced or pale.     Findings: No bruising, erythema, lesion or rash.  Neurological:     General: No focal deficit present.     Mental Status: He is alert and oriented to person, place, and time. Mental status is at baseline.  Psychiatric:        Mood and Affect: Mood normal.        Behavior: Behavior normal.        Thought Content: Thought content normal.        Judgment: Judgment normal.     Results for orders placed or performed in visit on 02/14/19  Bayer DCA Hb A1c Waived  Result Value Ref Range   HB A1C (BAYER DCA - WAIVED) 6.3 <7.0 %      Assessment & Plan:   Problem List Items Addressed This Visit      Cardiovascular and Mediastinum   HTN (hypertension) - Primary    Under good control on current regimen. Continue current regimen. Continue to monitor. Call with any concerns. Refills given. Labs drawn today.       Relevant Medications   losartan (COZAAR) 50 MG tablet   pravastatin (PRAVACHOL) 40 MG tablet   Other Relevant Orders   Comprehensive metabolic panel     Digestive   Gastroesophageal reflux disease with esophagitis    Under good control on current regimen. Continue current regimen. Continue to monitor. Call with any concerns. Refills given. Labs drawn today.         Endocrine   Diabetes mellitus type 2, diet-controlled (HCC)    Under good control with A1c  of 6.3- continue diet and exercise. Continue to monitor. Recheck 6 months. Call with any concerns.       Relevant Medications   losartan (COZAAR) 50 MG tablet   pravastatin (PRAVACHOL) 40 MG tablet   Other Relevant Orders   Bayer DCA Hb A1c Waived (Completed)   Comprehensive metabolic panel     Other   HLD (hyperlipidemia)    Under good control on current regimen. Continue current regimen. Continue to monitor. Call with any concerns. Refills given. Labs drawn today.       Relevant Medications   losartan (COZAAR) 50 MG tablet   pravastatin (PRAVACHOL) 40 MG tablet   Other Relevant Orders   Comprehensive metabolic panel   Lipid Panel w/o Chol/HDL Ratio    Other Visit Diagnoses    BPPV (benign paroxysmal positional vertigo), left       Will treat with burst  of prednisone and epley's manuvers. Call if not getting better and we'll get PT. Call wiht any concerns.        Follow up plan: Return in about 6 months (around 08/14/2019) for physical.

## 2019-02-15 LAB — COMPREHENSIVE METABOLIC PANEL
ALT: 23 IU/L (ref 0–44)
AST: 22 IU/L (ref 0–40)
Albumin/Globulin Ratio: 1.7 (ref 1.2–2.2)
Albumin: 4.2 g/dL (ref 4.0–5.0)
Alkaline Phosphatase: 102 IU/L (ref 39–117)
BUN/Creatinine Ratio: 16 (ref 9–20)
BUN: 15 mg/dL (ref 6–24)
Bilirubin Total: 0.5 mg/dL (ref 0.0–1.2)
CO2: 23 mmol/L (ref 20–29)
Calcium: 9.5 mg/dL (ref 8.7–10.2)
Chloride: 100 mmol/L (ref 96–106)
Creatinine, Ser: 0.94 mg/dL (ref 0.76–1.27)
GFR calc Af Amer: 111 mL/min/{1.73_m2} (ref >59)
GFR calc non Af Amer: 96 mL/min/{1.73_m2} (ref >59)
Globulin, Total: 2.5 g/dL (ref 1.5–4.5)
Glucose: 130 mg/dL — ABNORMAL HIGH (ref 65–99)
Potassium: 4.3 mmol/L (ref 3.5–5.2)
Sodium: 136 mmol/L (ref 134–144)
Total Protein: 6.7 g/dL (ref 6.0–8.5)

## 2019-02-15 LAB — LIPID PANEL W/O CHOL/HDL RATIO
Cholesterol, Total: 152 mg/dL (ref 100–199)
HDL: 52 mg/dL (ref >39)
LDL Chol Calc (NIH): 68 mg/dL (ref 0–99)
Triglycerides: 191 mg/dL — ABNORMAL HIGH (ref 0–149)
VLDL Cholesterol Cal: 32 mg/dL (ref 5–40)

## 2019-03-07 ENCOUNTER — Ambulatory Visit: Payer: BC Managed Care – PPO | Attending: Internal Medicine

## 2019-03-07 DIAGNOSIS — Z20822 Contact with and (suspected) exposure to covid-19: Secondary | ICD-10-CM

## 2019-03-08 LAB — NOVEL CORONAVIRUS, NAA: SARS-CoV-2, NAA: NOT DETECTED

## 2019-04-14 ENCOUNTER — Ambulatory Visit: Payer: BC Managed Care – PPO | Attending: Internal Medicine

## 2019-04-14 DIAGNOSIS — Z23 Encounter for immunization: Secondary | ICD-10-CM

## 2019-04-14 NOTE — Progress Notes (Signed)
   Covid-19 Vaccination Clinic  Name:  Stephen Mayo    MRN: [id number] DOB: 08-Nov-1970  04/14/2019  Stephen Mayo was observed post Covid-19 immunization for 15 minutes without incident. He was provided with Vaccine Information Sheet and instruction to access the V-Safe system.   Stephen Mayo was instructed to call 911 with any severe reactions post vaccine: Marland Kitchen Difficulty breathing  . Swelling of face and throat  . A fast heartbeat  . A bad rash all over body  . Dizziness and weakness

## 2019-05-09 ENCOUNTER — Ambulatory Visit: Payer: BC Managed Care – PPO | Attending: Internal Medicine

## 2019-05-09 DIAGNOSIS — Z23 Encounter for immunization: Secondary | ICD-10-CM

## 2019-05-09 NOTE — Progress Notes (Signed)
   Covid-19 Vaccination Clinic  Name:  Stephen Mayo    MRN: [id number] DOB: 1970/02/04  05/09/2019  [name] was observed post Covid-19 immunization for 15 minutes without incident. He was provided with Vaccine Information Sheet and instruction to access the V-Safe system.   [name] was instructed to call 911 with any severe reactions post vaccine: Marland Kitchen Difficulty breathing  . Swelling of face and throat  . A fast heartbeat  . A bad rash all over body  . Dizziness and weakness   Immunizations Administered    Name Date Dose VIS Date Route   Pfizer COVID-19 Vaccine 05/09/2019  2:46 PM 0.3 mL 01/06/2019 Intramuscular   Manufacturer: [employer], [employer]   Lot: [id number]   NDC: [address]-4

## 2019-05-15 DIAGNOSIS — Z03818 Encounter for observation for suspected exposure to other biological agents ruled out: Secondary | ICD-10-CM | POA: Diagnosis not present

## 2019-05-17 DIAGNOSIS — J019 Acute sinusitis, unspecified: Secondary | ICD-10-CM | POA: Diagnosis not present

## 2019-05-17 DIAGNOSIS — J301 Allergic rhinitis due to pollen: Secondary | ICD-10-CM | POA: Diagnosis not present

## 2019-07-10 DIAGNOSIS — R0981 Nasal congestion: Secondary | ICD-10-CM | POA: Diagnosis not present

## 2019-07-10 DIAGNOSIS — J309 Allergic rhinitis, unspecified: Secondary | ICD-10-CM | POA: Diagnosis not present

## 2019-07-10 DIAGNOSIS — J0101 Acute recurrent maxillary sinusitis: Secondary | ICD-10-CM | POA: Diagnosis not present

## 2019-07-10 DIAGNOSIS — K219 Gastro-esophageal reflux disease without esophagitis: Secondary | ICD-10-CM | POA: Diagnosis not present

## 2019-08-08 DIAGNOSIS — J301 Allergic rhinitis due to pollen: Secondary | ICD-10-CM | POA: Diagnosis not present

## 2019-08-09 DIAGNOSIS — R0981 Nasal congestion: Secondary | ICD-10-CM | POA: Diagnosis not present

## 2019-08-09 DIAGNOSIS — K219 Gastro-esophageal reflux disease without esophagitis: Secondary | ICD-10-CM | POA: Diagnosis not present

## 2019-08-09 DIAGNOSIS — J301 Allergic rhinitis due to pollen: Secondary | ICD-10-CM | POA: Diagnosis not present

## 2019-08-14 ENCOUNTER — Encounter: Payer: Self-pay | Admitting: Family Medicine

## 2019-08-14 ENCOUNTER — Other Ambulatory Visit: Payer: Self-pay

## 2019-08-14 ENCOUNTER — Ambulatory Visit: Payer: BC Managed Care – PPO | Admitting: Family Medicine

## 2019-08-14 VITALS — BP 136/88 | HR 85 | Temp 99.1°F | Wt 196.6 lb

## 2019-08-14 DIAGNOSIS — I10 Essential (primary) hypertension: Secondary | ICD-10-CM

## 2019-08-14 DIAGNOSIS — Z0000 Encounter for general adult medical examination without abnormal findings: Secondary | ICD-10-CM | POA: Diagnosis not present

## 2019-08-14 DIAGNOSIS — K2100 Gastro-esophageal reflux disease with esophagitis, without bleeding: Secondary | ICD-10-CM | POA: Diagnosis not present

## 2019-08-14 DIAGNOSIS — E782 Mixed hyperlipidemia: Secondary | ICD-10-CM

## 2019-08-14 DIAGNOSIS — Z1211 Encounter for screening for malignant neoplasm of colon: Secondary | ICD-10-CM

## 2019-08-14 DIAGNOSIS — E119 Type 2 diabetes mellitus without complications: Secondary | ICD-10-CM

## 2019-08-14 DIAGNOSIS — Z1159 Encounter for screening for other viral diseases: Secondary | ICD-10-CM

## 2019-08-14 MED ORDER — DEXLANSOPRAZOLE 60 MG PO CPDR: 60.0000 mg | DELAYED_RELEASE_CAPSULE | Freq: Every day | ORAL | 1 refills | Status: DC

## 2019-08-14 NOTE — Assessment & Plan Note (Signed)
Stable with A1c of 6.6. Continue diet and exercise. Continue to monitor. Call with any concerns. Recheck 6 months.

## 2019-08-14 NOTE — Progress Notes (Signed)
BP 136/88 (BP Location: Left Arm, Cuff Size: Normal)    Pulse 85    Temp 99.1 F (37.3 C) (Oral)    Wt 196 lb 9.6 oz (89.2 kg)    SpO2 99%    BMI 28.62 kg/m    Subjective:    Patient ID: Stephen Mayo, male    DOB: Feb 08, 1970, 49 y.o.   MRN: [id number]  HPI: Stephen Mayo is a 49 y.o. male presenting on 08/14/2019 for comprehensive medical examination. Current medical complaints include:  HYPERTENSION / HYPERLIPIDEMIA Satisfied with current treatment? yes Duration of hypertension: chronic BP monitoring frequency: not checking BP medication side effects: no Past BP meds: losartan Duration of hyperlipidemia: chronic Cholesterol medication side effects: no Cholesterol supplements: none Past cholesterol medications: pravastatin Medication compliance: excellent compliance Aspirin: no Recent stressors: no Recurrent headaches: no Visual changes: no Palpitations: no Dyspnea: no Chest pain: no Lower extremity edema: no Dizzy/lightheaded: no  DIABETES Hypoglycemic episodes:no Polydipsia/polyuria: no Visual disturbance: no Chest pain: no Paresthesias: no Glucose Monitoring: no  Accucheck frequency: Not Checking  Fasting glucose:  Post prandial:  Evening:  Before meals: Taking Insulin?: no  Long acting insulin:  Short acting insulin: Blood Pressure Monitoring: not checking Retinal Examination: Up to Date Foot Exam: Up to Date Diabetic Education: Completed Pneumovax: Up to Date Influenza: Up to Date Aspirin: no  GERD GERD control status: exacerbated  Satisfied with current treatment? no Heartburn frequency: daily Medication side effects: no  Medication compliance: excellent Previous GERD medications: omeprazole, aciphex Dysphagia: no Odynophagia:  no Hematemesis: no Blood in stool: no EGD: no  Interim Problems from his last visit: no  Depression Screen done today and results listed below:  [facility] 08/14/2019 08/11/2018 02/10/2018  12/15/2016 12/03/2015  Decreased Interest 0 0 0 0 0  Down, Depressed, Hopeless 0 0 0 0 0  PHQ - 2 Score 0 0 0 0 0  Altered sleeping 1 0 0 - -  Tired, decreased energy 0 0 0 - -  Change in appetite 0 0 0 - -  Feeling bad or failure about yourself  0 0 0 - -  Trouble concentrating 0 0 0 - -  Moving slowly or fidgety/restless 0 0 0 - -  Suicidal thoughts 0 0 0 - -  PHQ-9 Score 1 0 0 - -  Difficult doing work/chores Not difficult at all Not difficult at all Not difficult at all - -    Past Medical History:  Past Medical History:  Diagnosis Date   Allergy    Back pain    Diabetes mellitus without complication (HCC)    GERD (gastroesophageal reflux disease)    Hyperlipidemia    Hypertension     Surgical History:  Past Surgical History:  Procedure Laterality Date   COLONOSCOPY WITH PROPOFOL N/A 10/25/2015   Procedure: COLONOSCOPY WITH PROPOFOL;  Surgeon: Scot Junobert T Elliott, MD;  [facility];  Service: Endoscopy;  Laterality: N/A;   HAND SURGERY Right 2012   fracture- 5 pins and reconstruction   HERNIA REPAIR  2002   Double   NASAL SINUS SURGERY  2008   Maxillary Sinus Removed   STERIOD INJECTION     ulcer on his larynx      Medications:  Current Outpatient Medications on File Prior to Visit  Medication Sig   azelastine (ASTELIN) 0.1 % nasal spray Place 1 spray into both nostrils 2 (two) times daily.   losartan (COZAAR) 50 MG tablet Take 1 tablet (50  mg total) by mouth daily.   pravastatin (PRAVACHOL) 40 MG tablet Take 1 tablet (40 mg total) by mouth daily.   triamcinolone (NASACORT ALLERGY 24HR) 55 MCG/ACT AERO nasal inhaler Place 1 spray into the nose daily.   glucose blood (ONE TOUCH ULTRA TEST) test strip USE TO TEST DAILY (Patient not taking: Reported on 08/14/2019)   ONETOUCH DELICA LANCETS FINE MISC USE TO TEST DAILY (Patient not taking: Reported on 08/14/2019)   No current facility-administered medications on file prior to visit.     Allergies:  Allergies  Allergen Reactions   Lisinopril Cough    Social History:  Social History   Socioeconomic History   Marital status: Married    Spouse name: Not on file   Number of children: Not on file   Years of education: Not on file   Highest education level: Not on file  Occupational History   Not on file  Tobacco Use   Smoking status: Former Smoker    Quit date: 06/25/2005    Years since quitting: 14.1   Smokeless tobacco: Former Forensic psychologist Use: Never used  Substance and Sexual Activity   Alcohol use: Yes    Alcohol/week: 12.0 standard drinks    Types: 10 Cans of beer, 2 Shots of liquor per week   Drug use: No   Sexual activity: Yes  Other Topics Concern   Not on file  Social History Narrative   Not on file   Social Determinants of Health   Financial Resource Strain:    Difficulty of Paying Living Expenses:   Food Insecurity:    Worried About Programme researcher, broadcasting/film/video in the Last Year:    Barista in the Last Year:   Transportation Needs:    Freight forwarder (Medical):    Lack of Transportation (Non-Medical):   Physical Activity:    Days of Exercise per Week:    Minutes of Exercise per Session:   Stress:    Feeling of Stress :   Social Connections:    Frequency of Communication with Friends and Family:    Frequency of Social Gatherings with Friends and Family:    Attends Religious Services:    Active Member of Clubs or Organizations:    Attends Engineer, structural:    Marital Status:   Intimate Partner Violence:    Fear of Current or Ex-Partner:    Emotionally Abused:    Physically Abused:    Sexually Abused:    Social History   Tobacco Use  Smoking Status Former Smoker   Quit date: 06/25/2005   Years since quitting: 14.1  Smokeless Tobacco Former Neurosurgeon   Social History   Substance and Sexual Activity  Alcohol Use Yes   Alcohol/week: 12.0 standard drinks    Types: 10 Cans of beer, 2 Shots of liquor per week    Family History:  Family History  Problem Relation Age of Onset   Cancer Mother        Lung   Cancer Father        colon   Cancer Maternal Grandmother        breast, brain and colon   Lymphoma Paternal Grandmother    Cancer Paternal Grandfather        lung and throat    Past medical history, surgical history, medications, allergies, family history and social history reviewed with patient today and changes made to appropriate areas of the chart.   Review  of Systems  Constitutional: Negative.   HENT: Negative.   Eyes: Positive for blurred vision. Negative for double vision, photophobia, pain, discharge and redness.  Respiratory: Negative.   Cardiovascular: Negative.   Gastrointestinal: Positive for heartburn. Negative for abdominal pain, blood in stool, constipation, diarrhea, melena, nausea and vomiting.  Genitourinary: Negative.   Musculoskeletal: Positive for back pain. Negative for falls, joint pain, myalgias and neck pain.  Skin: Negative.   Neurological: Negative.   Endo/Heme/Allergies: Positive for environmental allergies. Negative for polydipsia. Does not bruise/bleed easily.  Psychiatric/Behavioral: Negative.     All other ROS negative except what is listed above and in the HPI.      Objective:    BP 136/88 (BP Location: Left Arm, Cuff Size: Normal)    Pulse 85    Temp 99.1 F (37.3 C) (Oral)    Wt 196 lb 9.6 oz (89.2 kg)    SpO2 99%    BMI 28.62 kg/m   Wt Readings from Last 3 Encounters:  08/14/19 196 lb 9.6 oz (89.2 kg)  08/11/18 205 lb (93 kg)  02/10/18 216 lb 8 oz (98.2 kg)    Physical Exam Vitals and nursing note reviewed.  Constitutional:      General: He is not in acute distress.    Appearance: Normal appearance. He is normal weight. He is not ill-appearing, toxic-appearing or diaphoretic.  HENT:     Head: Normocephalic and atraumatic.     Right Ear: Tympanic membrane, ear canal and external  ear normal. There is no impacted cerumen.     Left Ear: Tympanic membrane, ear canal and external ear normal. There is no impacted cerumen.     Nose: Nose normal. No congestion or rhinorrhea.     Mouth/Throat:     Mouth: Mucous membranes are moist.     Pharynx: Oropharynx is clear. No oropharyngeal exudate or posterior oropharyngeal erythema.  Eyes:     General: No scleral icterus.       Right eye: No discharge.        Left eye: No discharge.     Extraocular Movements: Extraocular movements intact.     Conjunctiva/sclera: Conjunctivae normal.     Pupils: Pupils are equal, round, and reactive to light.  Neck:     Vascular: No carotid bruit.  Cardiovascular:     Rate and Rhythm: Normal rate and regular rhythm.     Pulses: Normal pulses.     Heart sounds: No murmur heard.  No friction rub. No gallop.   Pulmonary:     Effort: Pulmonary effort is normal. No respiratory distress.     Breath sounds: Normal breath sounds. No stridor. No wheezing, rhonchi or rales.  Chest:     Chest wall: No tenderness.  Abdominal:     General: Abdomen is flat. Bowel sounds are normal. There is no distension.     Palpations: Abdomen is soft. There is no mass.     Tenderness: There is no abdominal tenderness. There is no right CVA tenderness, left CVA tenderness, guarding or rebound.     Hernia: No hernia is present.  Genitourinary:    Comments: Genital exam deferred with shared decision making Musculoskeletal:        General: No swelling, tenderness, deformity or signs of injury.     Cervical back: Normal range of motion and neck supple. No rigidity. No muscular tenderness.     Right lower leg: No edema.     Left lower leg: No edema.  Lymphadenopathy:  Cervical: No cervical adenopathy.  Skin:    General: Skin is warm and dry.     Capillary Refill: Capillary refill takes less than 2 seconds.     Coloration: Skin is not jaundiced or pale.     Findings: No bruising, erythema, lesion or rash.   Neurological:     General: No focal deficit present.     Mental Status: He is alert and oriented to person, place, and time.     Cranial Nerves: No cranial nerve deficit.     Sensory: No sensory deficit.     Motor: No weakness.     Coordination: Coordination normal.     Gait: Gait normal.     Deep Tendon Reflexes: Reflexes normal.  Psychiatric:        Mood and Affect: Mood normal.        Behavior: Behavior normal.        Thought Content: Thought content normal.        Judgment: Judgment normal.     Results for orders placed or performed in visit on 03/07/19  Novel Coronavirus, NAA ([facility])   Specimen: Nasopharyngeal(NP) swabs in vial transport medium   NASOPHARYNGE  TESTING  Result Value Ref Range   SARS-CoV-2, NAA Not Detected Not Detected      Assessment & Plan:   Problem List Items Addressed This Visit      Cardiovascular and Mediastinum   HTN (hypertension)    Under good control on current regimen. Continue current regimen. Continue to monitor. Call with any concerns. Refills given. Labs drawn today.        Relevant Orders   Comprehensive metabolic panel   Microalbumin, Urine Waived     Digestive   Gastroesophageal reflux disease with esophagitis    In exacerbation. Will get him into GI and change from aciphex to dexilant. Call with any concerns.       Relevant Orders   CBC with Differential/Platelet   Comprehensive metabolic panel   Ambulatory referral to Gastroenterology     Endocrine   Diabetes mellitus type 2, diet-controlled (HCC)    Stable with A1c of 6.6. Continue diet and exercise. Continue to monitor. Call with any concerns. Recheck 6 months.       Relevant Orders   Bayer DCA Hb A1c Waived   Comprehensive metabolic panel   Microalbumin, Urine Waived     Other   HLD (hyperlipidemia)    Under good control on current regimen. Continue current regimen. Continue to monitor. Call with any concerns. Refills given. Labs drawn today.         Relevant Orders   Comprehensive metabolic panel   Lipid Panel w/o Chol/HDL Ratio    Other Visit Diagnoses    Routine general medical examination at a health care facility    -  Primary   Vaccines up to date. Screening labs checked today. Colonoscopy ordered. Continue diet and exercise. Call with any concerns.    Encounter for hepatitis C screening test for low risk patient       Labs drawn today. Await results.    Relevant Orders   Hepatitis C Antibody   Screening for colon cancer       Referral to GI made today   Relevant Orders   Ambulatory referral to Gastroenterology       Discussed aspirin prophylaxis for myocardial infarction prevention and decision was made to start ASA  LABORATORY TESTING:  Health maintenance labs ordered today as discussed above.   The  natural history of prostate cancer and ongoing controversy regarding screening and potential treatment outcomes of prostate cancer has been discussed with the patient. The meaning of a false positive PSA and a false negative PSA has been discussed. He indicates understanding of the limitations of this screening test and wishes to proceed with screening PSA testing.   IMMUNIZATIONS:   - Tdap: Tetanus vaccination status reviewed: last tetanus booster within 10 years. - Influenza: Postponed to flu season - Pneumovax: Up to date  SCREENING: - Colonoscopy: Ordered today  Discussed with patient purpose of the colonoscopy is to detect colon cancer at curable precancerous or early stages   PATIENT COUNSELING:    Sexuality: Discussed sexually transmitted diseases, partner selection, use of condoms, avoidance of unintended pregnancy  and contraceptive alternatives.   Advised to avoid cigarette smoking.  I discussed with the patient that most people either abstain from alcohol or drink within safe limits (<=14/week and <=4 drinks/occasion for males, <=7/weeks and <= 3 drinks/occasion for females) and that the risk for alcohol  disorders and other health effects rises proportionally with the number of drinks per week and how often a drinker exceeds daily limits.  Discussed cessation/primary prevention of drug use and availability of treatment for abuse.   Diet: Encouraged to adjust caloric intake to maintain  or achieve ideal body weight, to reduce intake of dietary saturated fat and total fat, to limit sodium intake by avoiding high sodium foods and not adding table salt, and to maintain adequate dietary potassium and calcium preferably from fresh fruits, vegetables, and low-fat dairy products.    stressed the importance of regular exercise  Injury prevention: Discussed safety belts, safety helmets, smoke detector, smoking near bedding or upholstery.   Dental health: Discussed importance of regular tooth brushing, flossing, and dental visits.   Follow up plan: NEXT PREVENTATIVE PHYSICAL DUE IN 1 YEAR. Return in about 6 months (around 02/14/2020).

## 2019-08-14 NOTE — Patient Instructions (Signed)
Health Maintenance, Male Adopting a healthy lifestyle and getting preventive care are important in promoting health and wellness. Ask your health care provider about:  The right schedule for you to have regular tests and exams.  Things you can do on your own to prevent diseases and keep yourself healthy. What should I know about diet, weight, and exercise? Eat a healthy diet   Eat a diet that includes plenty of vegetables, fruits, low-fat dairy products, and lean protein.  Do not eat a lot of foods that are high in solid fats, added sugars, or sodium. Maintain a healthy weight Body mass index (BMI) is a measurement that can be used to identify possible weight problems. It estimates body fat based on height and weight. Your health care provider can help determine your BMI and help you achieve or maintain a healthy weight. Get regular exercise Get regular exercise. This is one of the most important things you can do for your health. Most adults should:  Exercise for at least 150 minutes each week. The exercise should increase your heart rate and make you sweat (moderate-intensity exercise).  Do strengthening exercises at least twice a week. This is in addition to the moderate-intensity exercise.  Spend less time sitting. Even light physical activity can be beneficial. Watch cholesterol and blood lipids Have your blood tested for lipids and cholesterol at 48 years of age, then have this test every 5 years. You may need to have your cholesterol levels checked more often if:  Your lipid or cholesterol levels are high.  You are older than 48 years of age.  You are at high risk for heart disease. What should I know about cancer screening? Many types of cancers can be detected early and may often be prevented. Depending on your health history and family history, you may need to have cancer screening at various ages. This may include screening for:  Colorectal cancer.  Prostate  cancer.  Skin cancer.  Lung cancer. What should I know about heart disease, diabetes, and high blood pressure? Blood pressure and heart disease  High blood pressure causes heart disease and increases the risk of stroke. This is more likely to develop in people who have high blood pressure readings, are of African descent, or are overweight.  Talk with your health care provider about your target blood pressure readings.  Have your blood pressure checked: ? Every 3-5 years if you are 18-39 years of age. ? Every year if you are 40 years old or older.  If you are between the ages of 65 and 75 and are a current or former smoker, ask your health care provider if you should have a one-time screening for abdominal aortic aneurysm (AAA). Diabetes Have regular diabetes screenings. This checks your fasting blood sugar level. Have the screening done:  Once every three years after age 45 if you are at a normal weight and have a low risk for diabetes.  More often and at a younger age if you are overweight or have a high risk for diabetes. What should I know about preventing infection? Hepatitis B If you have a higher risk for hepatitis B, you should be screened for this virus. Talk with your health care provider to find out if you are at risk for hepatitis B infection. Hepatitis C Blood testing is recommended for:  Everyone born from 1945 through 1965.  Anyone with known risk factors for hepatitis C. Sexually transmitted infections (STIs)  You should be screened each year   for STIs, including gonorrhea and chlamydia, if: ? You are sexually active and are younger than 48 years of age. ? You are older than 48 years of age and your health care provider tells you that you are at risk for this type of infection. ? Your sexual activity has changed since you were last screened, and you are at increased risk for chlamydia or gonorrhea. Ask your health care provider if you are at risk.  Ask your  health care provider about whether you are at high risk for HIV. Your health care provider may recommend a prescription medicine to help prevent HIV infection. If you choose to take medicine to prevent HIV, you should first get tested for HIV. You should then be tested every 3 months for as long as you are taking the medicine. Follow these instructions at home: Lifestyle  Do not use any products that contain nicotine or tobacco, such as cigarettes, e-cigarettes, and chewing tobacco. If you need help quitting, ask your health care provider.  Do not use street drugs.  Do not share needles.  Ask your health care provider for help if you need support or information about quitting drugs. Alcohol use  Do not drink alcohol if your health care provider tells you not to drink.  If you drink alcohol: ? Limit how much you have to 0-2 drinks a day. ? Be aware of how much alcohol is in your drink. In the [place] one drink equals one 12 oz bottle of beer (355 mL), one 5 oz glass of wine (148 mL), or one 1 oz glass of hard liquor (44 mL). General instructions  Schedule regular health, dental, and eye exams.  Stay current with your vaccines.  Tell your health care provider if: ? You often feel depressed. ? You have ever been abused or do not feel safe at home. Summary  Adopting a healthy lifestyle and getting preventive care are important in promoting health and wellness.  Follow your health care provider's instructions about healthy diet, exercising, and getting tested or screened for diseases.  Follow your health care provider's instructions on monitoring your cholesterol and blood pressure. This information is not intended to replace advice given to you by your health care provider. Make sure you discuss any questions you have with your health care provider. Document Revised: 01/05/2018 Document Reviewed: 01/05/2018 Elsevier Patient Education  2020 [employer].  Low Back Sprain or Strain  Rehab Ask your health care provider which exercises are safe for you. Do exercises exactly as told by your health care provider and adjust them as directed. It is normal to feel mild stretching, pulling, tightness, or discomfort as you do these exercises. Stop right away if you feel sudden pain or your pain gets worse. Do not begin these exercises until told by your health care provider. Stretching and range-of-motion exercises These exercises warm up your muscles and joints and improve the movement and flexibility of your back. These exercises also help to relieve pain, numbness, and tingling. Lumbar rotation  1. Lie on your back on a firm surface and bend your knees. 2. Straighten your arms out to your sides so each arm forms a 90-degree angle (right angle) with a side of your body. 3. Slowly move (rotate) both of your knees to one side of your body until you feel a stretch in your lower back (lumbar). Try not to let your shoulders lift off the floor. 4. Hold this position for __________ seconds. 5. Tense your abdominal  muscles and slowly move your knees back to the starting position. 6. Repeat this exercise on the other side of your body. Repeat __________ times. Complete this exercise __________ times a day. Single knee to chest  1. Lie on your back on a firm surface with both legs straight. 2. Bend one of your knees. Use your hands to move your knee up toward your chest until you feel a gentle stretch in your lower back and buttock. ? Hold your leg in this position by holding on to the front of your knee. ? Keep your other leg as straight as possible. 3. Hold this position for __________ seconds. 4. Slowly return to the starting position. 5. Repeat with your other leg. Repeat __________ times. Complete this exercise __________ times a day. Prone extension on elbows  1. Lie on your abdomen on a firm surface (prone position). 2. Prop yourself up on your elbows. 3. Use your arms to help  lift your chest up until you feel a gentle stretch in your abdomen and your lower back. ? This will place some of your body weight on your elbows. If this is uncomfortable, try stacking pillows under your chest. ? Your hips should stay down, against the surface that you are lying on. Keep your hip and back muscles relaxed. 4. Hold this position for __________ seconds. 5. Slowly relax your upper body and return to the starting position. Repeat __________ times. Complete this exercise __________ times a day. Strengthening exercises These exercises build strength and endurance in your back. Endurance is the ability to use your muscles for a long time, even after they get tired. Pelvic tilt This exercise strengthens the muscles that lie deep in the abdomen. 1. Lie on your back on a firm surface. Bend your knees and keep your feet flat on the floor. 2. Tense your abdominal muscles. Tip your pelvis up toward the ceiling and flatten your lower back into the floor. ? To help with this exercise, you may place a small towel under your lower back and try to push your back into the towel. 3. Hold this position for __________ seconds. 4. Let your muscles relax completely before you repeat this exercise. Repeat __________ times. Complete this exercise __________ times a day. Alternating arm and leg raises  1. Get on your hands and knees on a firm surface. If you are on a hard floor, you may want to use padding, such as an exercise mat, to cushion your knees. 2. Line up your arms and legs. Your hands should be directly below your shoulders, and your knees should be directly below your hips. 3. Lift your left leg behind you. At the same time, raise your right arm and straighten it in front of you. ? Do not lift your leg higher than your hip. ? Do not lift your arm higher than your shoulder. ? Keep your abdominal and back muscles tight. ? Keep your hips facing the ground. ? Do not arch your back. ? Keep  your balance carefully, and do not hold your breath. 4. Hold this position for __________ seconds. 5. Slowly return to the starting position. 6. Repeat with your right leg and your left arm. Repeat __________ times. Complete this exercise __________ times a day. Abdominal set with straight leg raise  1. Lie on your back on a firm surface. 2. Bend one of your knees and keep your other leg straight. 3. Tense your abdominal muscles and lift your straight leg up, 4-6 inches (  10-15 cm) off the ground. 4. Keep your abdominal muscles tight and hold this position for __________ seconds. ? Do not hold your breath. ? Do not arch your back. Keep it flat against the ground. 5. Keep your abdominal muscles tense as you slowly lower your leg back to the starting position. 6. Repeat with your other leg. Repeat __________ times. Complete this exercise __________ times a day. Single leg lower with bent knees 1. Lie on your back on a firm surface. 2. Tense your abdominal muscles and lift your feet off the floor, one foot at a time, so your knees and hips are bent in 90-degree angles (right angles). ? Your knees should be over your hips and your lower legs should be parallel to the floor. 3. Keeping your abdominal muscles tense and your knee bent, slowly lower one of your legs so your toe touches the ground. 4. Lift your leg back up to return to the starting position. ? Do not hold your breath. ? Do not let your back arch. Keep your back flat against the ground. 5. Repeat with your other leg. Repeat __________ times. Complete this exercise __________ times a day. Posture and body mechanics Good posture and healthy body mechanics can help to relieve stress in your body's tissues and joints. Body mechanics refers to the movements and positions of your body while you do your daily activities. Posture is part of body mechanics. Good posture means:  Your spine is in its natural S-curve position  (neutral).  Your shoulders are pulled back slightly.  Your head is not tipped forward. Follow these guidelines to improve your posture and body mechanics in your everyday activities. Standing   When standing, keep your spine neutral and your feet about hip width apart. Keep a slight bend in your knees. Your ears, shoulders, and hips should line up.  When you do a task in which you stand in one place for a long time, place one foot up on a stable object that is 2-4 inches (5-10 cm) high, such as a footstool. This helps keep your spine neutral. Sitting   When sitting, keep your spine neutral and keep your feet flat on the floor. Use a footrest, if necessary, and keep your thighs parallel to the floor. Avoid rounding your shoulders, and avoid tilting your head forward.  When working at a desk or a computer, keep your desk at a height where your hands are slightly lower than your elbows. Slide your chair under your desk so you are close enough to maintain good posture.  When working at a computer, place your monitor at a height where you are looking straight ahead and you do not have to tilt your head forward or downward to look at the screen. Resting  When lying down and resting, avoid positions that are most painful for you.  If you have pain with activities such as sitting, bending, stooping, or squatting, lie in a position in which your body does not bend very much. For example, avoid curling up on your side with your arms and knees near your chest (fetal position).  If you have pain with activities such as standing for a long time or reaching with your arms, lie with your spine in a neutral position and bend your knees slightly. Try the following positions: ? Lying on your side with a pillow between your knees. ? Lying on your back with a pillow under your knees. Lifting   When lifting objects, keep your  feet at least shoulder width apart and tighten your abdominal muscles.  Bend  your knees and hips and keep your spine neutral. It is important to lift using the strength of your legs, not your back. Do not lock your knees straight out.  Always ask for help to lift heavy or awkward objects. This information is not intended to replace advice given to you by your health care provider. Make sure you discuss any questions you have with your health care provider. Document Revised: 05/06/2018 Document Reviewed: 02/03/2018 Elsevier Patient Education  2020 [employer].

## 2019-08-14 NOTE — Assessment & Plan Note (Signed)
Under good control on current regimen. Continue current regimen. Continue to monitor. Call with any concerns. Refills given. Labs drawn today.   

## 2019-08-14 NOTE — Progress Notes (Deleted)
   There were no vitals taken for this visit.   Subjective:    Patient ID: Stephen Mayo, male    DOB: 06/06/1970, 49 y.o.   MRN: [id number]  HPI: Stephen Mayo is a 49 y.o. male  No chief complaint on file.  HYPERTENSION / HYPERLIPIDEMIA Satisfied with current treatment? {Blank single:19197::"yes","no"} Duration of hypertension: {Blank single:19197::"chronic","months","years"} BP monitoring frequency: {Blank single:19197::"not checking","rarely","daily","weekly","monthly","a few times a day","a few times a week","a few times a month"} BP range:  BP medication side effects: {Blank single:19197::"yes","no"} Past BP meds: {Blank multiple:19196::"none","amlodipine","amlodipine/benazepril","atenolol","benazepril","benazepril/HCTZ","bisoprolol (bystolic)","carvedilol","chlorthalidone","clonidine","diltiazem","exforge HCT","HCTZ","irbesartan (avapro)","labetalol","lisinopril","lisinopril-HCTZ","losartan (cozaar)","methyldopa","nifedipine","olmesartan (benicar)","olmesartan-HCTZ","quinapril","ramipril","spironalactone","tekturna","valsartan","valsartan-HCTZ","verapamil"} Duration of hyperlipidemia: {Blank single:19197::"chronic","months","years"} Cholesterol medication side effects: {Blank single:19197::"yes","no"} Cholesterol supplements: {Blank multiple:19196::"none","fish oil","niacin","red yeast rice"} Past cholesterol medications: {Blank multiple:19196::"none","atorvastain (lipitor)","lovastatin (mevacor)","pravastatin (pravachol)","rosuvastatin (crestor)","simvastatin (zocor)","vytorin","fenofibrate (tricor)","gemfibrozil","ezetimide (zetia)","niaspan","lovaza"} Medication compliance: {Blank single:19197::"excellent compliance","good compliance","fair compliance","poor compliance"} Aspirin: {Blank single:19197::"yes","no"} Recent stressors: {Blank single:19197::"yes","no"} Recurrent headaches: {Blank single:19197::"yes","no"} Visual changes: {Blank  single:19197::"yes","no"} Palpitations: {Blank single:19197::"yes","no"} Dyspnea: {Blank single:19197::"yes","no"} Chest pain: {Blank single:19197::"yes","no"} Lower extremity edema: {Blank single:19197::"yes","no"} Dizzy/lightheaded: {Blank single:19197::"yes","no"}  DIABETES Hypoglycemic episodes:{Blank single:19197::"yes","no"} Polydipsia/polyuria: {Blank single:19197::"yes","no"} Visual disturbance: {Blank single:19197::"yes","no"} Chest pain: {Blank single:19197::"yes","no"} Paresthesias: {Blank single:19197::"yes","no"} Glucose Monitoring: {Blank single:19197::"yes","no"}  Accucheck frequency: {Blank single:19197::"Not Checking","Daily","BID","TID"}  Fasting glucose:  Post prandial:  Evening:  Before meals: Taking Insulin?: {Blank single:19197::"yes","no"}  Long acting insulin:  Short acting insulin: Blood Pressure Monitoring: {Blank single:19197::"not checking","rarely","daily","weekly","monthly","a few times a day","a few times a week","a few times a month"} Retinal Examination: {Blank single:19197::"Up to Date","Not up to Date"} Foot Exam: {Blank single:19197::"Up to Date","Not up to Date"} Diabetic Education: {Blank single:19197::"Completed","Not Completed"} Pneumovax: {Blank single:19197::"Up to Date","Not up to Date","unknown"} Influenza: {Blank single:19197::"Up to Date","Not up to Date","unknown"} Aspirin: {Blank single:19197::"yes","no"}  GERD GERD control status: {Blank single:19197::"controlled","uncontrolled","better","worse","exacerbated","stable"}Satisfied with current treatment? {Blank single:19197::"yes","no"} Heartburn frequency:  Medication side effects: {Blank single:19197::"yes","no"}  Medication compliance: {Blank multiple:19196::"better","worse","stable","fluctuating"} Previous GERD medications: Antacid use frequency:   Duration:  Nature:  Location:  Heartburn duration:  Alleviatiating factors:   Aggravating factors:  Dysphagia: {Blank  single:19197::"yes","no"} Odynophagia:  {Blank single:19197::"yes","no"} Hematemesis: {Blank single:19197::"yes","no"} Blood in stool: {Blank single:19197::"yes","no"} EGD: {Blank single:19197::"yes","no"}  Relevant past medical, surgical, family and social history reviewed and updated as indicated. Interim medical history since our last visit reviewed. Allergies and medications reviewed and updated.  Review of Systems  Per HPI unless specifically indicated above     Objective:    There were no vitals taken for this visit.  Wt Readings from Last 3 Encounters:  08/11/18 205 lb (93 kg)  02/10/18 216 lb 8 oz (98.2 kg)  08/03/17 207 lb 3.2 oz (94 kg)    Physical Exam  Results for orders placed or performed in visit on 03/07/19  Novel Coronavirus, NAA ([facility])   Specimen: Nasopharyngeal(NP) swabs in vial transport medium   NASOPHARYNGE  TESTING  Result Value Ref Range   SARS-CoV-2, NAA Not Detected Not Detected      Assessment & Plan:   Problem List Items Addressed This Visit      Cardiovascular and Mediastinum   HTN (hypertension) - Primary     Digestive   Gastroesophageal reflux disease with esophagitis     Endocrine   Diabetes mellitus type 2, diet-controlled (HCC)     Other   HLD (hyperlipidemia)       Follow up plan: No follow-ups on file.

## 2019-08-14 NOTE — Assessment & Plan Note (Signed)
In exacerbation. Will get him into GI and change from aciphex to dexilant. Call with any concerns.

## 2019-08-15 LAB — LIPID PANEL W/O CHOL/HDL RATIO
Cholesterol, Total: 152 mg/dL (ref 100–199)
HDL: 32 mg/dL — ABNORMAL LOW (ref >39)
LDL Chol Calc (NIH): 36 mg/dL (ref 0–99)
Triglycerides: 603 mg/dL (ref 0–149)
VLDL Cholesterol Cal: 84 mg/dL — ABNORMAL HIGH (ref 5–40)

## 2019-08-15 LAB — MICROALBUMIN, URINE WAIVED
Creatinine, Urine Waived: 200 mg/dL (ref 10–300)
Microalb, Ur Waived: 10 mg/L (ref 0–19)
Microalb/Creat Ratio: <30 mg/g (ref <30)

## 2019-08-15 LAB — COMPREHENSIVE METABOLIC PANEL
ALT: 42 IU/L (ref 0–44)
AST: 55 IU/L — ABNORMAL HIGH (ref 0–40)
Albumin/Globulin Ratio: 1.5 (ref 1.2–2.2)
Albumin: 4 g/dL (ref 4.0–5.0)
Alkaline Phosphatase: 124 IU/L — ABNORMAL HIGH (ref 48–121)
BUN/Creatinine Ratio: 15 (ref 9–20)
BUN: 13 mg/dL (ref 6–24)
Bilirubin Total: 0.5 mg/dL (ref 0.0–1.2)
CO2: 24 mmol/L (ref 20–29)
Calcium: 8.9 mg/dL (ref 8.7–10.2)
Chloride: 100 mmol/L (ref 96–106)
Creatinine, Ser: 0.88 mg/dL (ref 0.76–1.27)
GFR calc Af Amer: 118 mL/min/{1.73_m2} (ref >59)
GFR calc non Af Amer: 102 mL/min/{1.73_m2} (ref >59)
Globulin, Total: 2.6 g/dL (ref 1.5–4.5)
Glucose: 117 mg/dL — ABNORMAL HIGH (ref 65–99)
Potassium: 4.4 mmol/L (ref 3.5–5.2)
Sodium: 138 mmol/L (ref 134–144)
Total Protein: 6.6 g/dL (ref 6.0–8.5)

## 2019-08-15 LAB — CBC WITH DIFFERENTIAL/PLATELET
Basophils Absolute: 0 10*3/uL (ref 0.0–0.2)
Basos: 1 %
EOS (ABSOLUTE): 0 10*3/uL (ref 0.0–0.4)
Eos: 1 %
Hematocrit: 42.2 % (ref 37.5–51.0)
Hemoglobin: 14.2 g/dL (ref 13.0–17.7)
Immature Grans (Abs): 0 10*3/uL (ref 0.0–0.1)
Immature Granulocytes: 0 %
Lymphocytes Absolute: 1.2 10*3/uL (ref 0.7–3.1)
Lymphs: 17 %
MCH: 31.3 pg (ref 26.6–33.0)
MCHC: 33.6 g/dL (ref 31.5–35.7)
MCV: 93 fL (ref 79–97)
Monocytes Absolute: 0.3 10*3/uL (ref 0.1–0.9)
Monocytes: 5 %
Neutrophils Absolute: 5.2 10*3/uL (ref 1.4–7.0)
Neutrophils: 76 %
Platelets: 195 10*3/uL (ref 150–450)
RBC: 4.54 x10E6/uL (ref 4.14–5.80)
RDW: 13.1 % (ref 11.6–15.4)
WBC: 6.7 10*3/uL (ref 3.4–10.8)

## 2019-08-15 LAB — HEPATITIS C ANTIBODY: Hep C Virus Ab: <0.1 s/co ratio (ref 0.0–0.9)

## 2019-08-15 LAB — BAYER DCA HB A1C WAIVED: HB A1C (BAYER DCA - WAIVED): 6.6 % (ref <7.0)

## 2019-08-18 ENCOUNTER — Other Ambulatory Visit: Payer: Self-pay | Admitting: Family Medicine

## 2019-08-18 MED ORDER — ROSUVASTATIN CALCIUM 20 MG PO TABS
20.0000 mg | ORAL_TABLET | Freq: Every day | ORAL | 1 refills | Status: DC
Start: 2019-08-18 — End: 2019-11-28

## 2019-08-21 ENCOUNTER — Telehealth: Payer: Self-pay

## 2019-08-21 NOTE — Telephone Encounter (Signed)
PA for dexilant dr caps initiated through [employer] Key: [facility]

## 2019-09-19 ENCOUNTER — Other Ambulatory Visit: Payer: Self-pay | Admitting: Family Medicine

## 2019-09-21 ENCOUNTER — Other Ambulatory Visit: Payer: Self-pay

## 2019-09-21 ENCOUNTER — Encounter: Payer: Self-pay | Admitting: Family Medicine

## 2019-09-21 ENCOUNTER — Ambulatory Visit: Payer: BC Managed Care – PPO | Admitting: Family Medicine

## 2019-09-21 VITALS — BP 148/78 | Wt 191.4 lb

## 2019-09-21 DIAGNOSIS — M545 Low back pain, unspecified: Secondary | ICD-10-CM

## 2019-09-21 LAB — UA/M W/RFLX CULTURE, ROUTINE
Bilirubin, UA: NEGATIVE
Glucose, UA: NEGATIVE
Leukocytes,UA: NEGATIVE
Nitrite, UA: NEGATIVE
Protein,UA: NEGATIVE
RBC, UA: NEGATIVE
Specific Gravity, UA: 1.02 (ref 1.005–1.030)
Urobilinogen, Ur: >=8 mg/dL (ref 0.2–1.0)
pH, UA: 6 (ref 5.0–7.5)

## 2019-09-21 LAB — MICROSCOPIC EXAMINATION
Bacteria, UA: NONE SEEN
RBC, Urine: NONE SEEN /hpf (ref 0–2)

## 2019-09-21 MED ORDER — CYCLOBENZAPRINE HCL 10 MG PO TABS
10.0000 mg | ORAL_TABLET | Freq: Every day | ORAL | 0 refills | Status: DC
Start: 2019-09-21 — End: 2020-10-28

## 2019-09-21 MED ORDER — NAPROXEN 500 MG PO TABS
500.0000 mg | ORAL_TABLET | Freq: Two times a day (BID) | ORAL | 3 refills | Status: DC
Start: 2019-09-21 — End: 2019-11-28

## 2019-09-21 NOTE — Patient Instructions (Signed)

## 2019-09-21 NOTE — Progress Notes (Signed)
BP (!) 148/78   Wt 191 lb 6.4 oz (86.8 kg)   BMI 27.86 kg/m    Subjective:    Patient ID: Stephen Mayo, male    DOB: 03-15-1970, 49 y.o.   MRN: [id number]  HPI: Stephen Mayo is a 49 y.o. male  Chief Complaint  Patient presents with  . Back Pain   BACK PAIN- vomiting last week Duration: months Mechanism of injury: unknown Location: bilateral and low back Onset: gradual Severity: moderate Quality: dull Frequency: constant Radiation: none Aggravating factors: sitting Alleviating factors: nothing Status: fluctuating Treatments attempted: rest  Relief with NSAIDs?: No NSAIDs Taken Nighttime pain:  yes Paresthesias / decreased sensation:  no Bowel / bladder incontinence:  no Fevers:  no Dysuria / urinary frequency:  no  Relevant past medical, surgical, family and social history reviewed and updated as indicated. Interim medical history since our last visit reviewed. Allergies and medications reviewed and updated.  Review of Systems  Constitutional: Negative.   Respiratory: Negative.   Cardiovascular: Negative.   Gastrointestinal: Negative.   Musculoskeletal: Positive for back pain and myalgias. Negative for arthralgias, gait problem, joint swelling, neck pain and neck stiffness.  Skin: Negative.   Neurological: Negative.   Psychiatric/Behavioral: Negative.     Per HPI unless specifically indicated above     Objective:    BP (!) 148/78   Wt 191 lb 6.4 oz (86.8 kg)   BMI 27.86 kg/m   Wt Readings from Last 3 Encounters:  09/21/19 191 lb 6.4 oz (86.8 kg)  08/14/19 196 lb 9.6 oz (89.2 kg)  08/11/18 205 lb (93 kg)    Physical Exam Vitals and nursing note reviewed.  Constitutional:      General: He is not in acute distress.    Appearance: Normal appearance. He is not ill-appearing, toxic-appearing or diaphoretic.  HENT:     Head: Normocephalic and atraumatic.     Right Ear: External ear normal.     Left Ear: External ear normal.     Nose: Nose  normal.     Mouth/Throat:     Mouth: Mucous membranes are moist.     Pharynx: Oropharynx is clear.  Eyes:     General: No scleral icterus.       Right eye: No discharge.        Left eye: No discharge.     Extraocular Movements: Extraocular movements intact.     Conjunctiva/sclera: Conjunctivae normal.     Pupils: Pupils are equal, round, and reactive to light.  Cardiovascular:     Rate and Rhythm: Normal rate and regular rhythm.     Pulses: Normal pulses.     Heart sounds: Normal heart sounds. No murmur heard.  No friction rub. No gallop.   Pulmonary:     Effort: Pulmonary effort is normal. No respiratory distress.     Breath sounds: Normal breath sounds. No stridor. No wheezing, rhonchi or rales.  Chest:     Chest wall: No tenderness.  Musculoskeletal:        General: Normal range of motion.     Cervical back: Normal range of motion and neck supple.     Comments: Hypertonic bilateral paraspinals   Skin:    General: Skin is warm and dry.     Capillary Refill: Capillary refill takes less than 2 seconds.     Coloration: Skin is not jaundiced or pale.     Findings: No bruising, erythema, lesion or rash.  Neurological:  General: No focal deficit present.     Mental Status: He is alert and oriented to person, place, and time. Mental status is at baseline.  Psychiatric:        Mood and Affect: Mood normal.        Behavior: Behavior normal.        Thought Content: Thought content normal.        Judgment: Judgment normal.     Results for orders placed or performed in visit on 08/14/19  Bayer DCA Hb A1c Waived  Result Value Ref Range   HB A1C (BAYER DCA - WAIVED) 6.6 <7.0 %  CBC with Differential/Platelet  Result Value Ref Range   WBC 6.7 3.4 - 10.8 x10E3/uL   RBC 4.54 4.14 - 5.80 x10E6/uL   Hemoglobin 14.2 13.0 - 17.7 g/dL   Hematocrit 34.7 42.5 - 51.0 %   MCV 93 79 - 97 fL   MCH 31.3 26.6 - 33.0 pg   MCHC 33.6 31 - 35 g/dL   RDW 95.6 38.7 - 56.4 %   Platelets 195  150 - 450 x10E3/uL   Neutrophils 76 Not Estab. %   Lymphs 17 Not Estab. %   Monocytes 5 Not Estab. %   Eos 1 Not Estab. %   Basos 1 Not Estab. %   Neutrophils Absolute 5.2 1 - 7 x10E3/uL   Lymphocytes Absolute 1.2 0 - 3 x10E3/uL   Monocytes Absolute 0.3 0 - 0 x10E3/uL   EOS (ABSOLUTE) 0.0 0.0 - 0.4 x10E3/uL   Basophils Absolute 0.0 0 - 0 x10E3/uL   Immature Granulocytes 0 Not Estab. %   Immature Grans (Abs) 0.0 0.0 - 0.1 x10E3/uL  Comprehensive metabolic panel  Result Value Ref Range   Glucose 117 (H) 65 - 99 mg/dL   BUN 13 6 - 24 mg/dL   Creatinine, Ser 3.32 0.76 - 1.27 mg/dL   GFR calc non Af Amer 102 >59 mL/min/1.73   GFR calc Af Amer 118 >59 mL/min/1.73   BUN/Creatinine Ratio 15 9 - 20   Sodium 138 134 - 144 mmol/L   Potassium 4.4 3.5 - 5.2 mmol/L   Chloride 100 96 - 106 mmol/L   CO2 24 20 - 29 mmol/L   Calcium 8.9 8.7 - 10.2 mg/dL   Total Protein 6.6 6.0 - 8.5 g/dL   Albumin 4.0 4.0 - 5.0 g/dL   Globulin, Total 2.6 1.5 - 4.5 g/dL   Albumin/Globulin Ratio 1.5 1.2 - 2.2   Bilirubin Total 0.5 0.0 - 1.2 mg/dL   Alkaline Phosphatase 124 (H) 48 - 121 IU/L   AST 55 (H) 0 - 40 IU/L   ALT 42 0 - 44 IU/L  Lipid Panel w/o Chol/HDL Ratio  Result Value Ref Range   Cholesterol, Total 152 100 - 199 mg/dL   Triglycerides 951 (HH) 0 - 149 mg/dL   HDL 32 (L) >88 mg/dL   VLDL Cholesterol Cal 84 (H) 5 - 40 mg/dL   LDL Chol Calc (NIH) 36 0 - 99 mg/dL  Microalbumin, Urine Waived  Result Value Ref Range   Microalb, Ur Waived 10 0 - 19 mg/L   Creatinine, Urine Waived 200 10 - 300 mg/dL   Microalb/Creat Ratio <30 <30 mg/g  Hepatitis C Antibody  Result Value Ref Range   Hep C Virus Ab <0.1 0.0 - 0.9 s/co ratio      Assessment & Plan:   Problem List Items Addressed This Visit    None    Visit Diagnoses  Low back pain, unspecified back pain laterality, unspecified chronicity, unspecified whether sciatica present    -  Primary   UA shows uro. Will check CMP. Start flexeril and  naproxen and stretches. Recheck 2 weeks. Call with any concerns. Continue to monitor.    Relevant Medications   cyclobenzaprine (FLEXERIL) 10 MG tablet   naproxen (NAPROSYN) 500 MG tablet   Other Relevant Orders   UA/M w/rflx Culture, Routine (STAT)   Comprehensive metabolic panel       Follow up plan: Return in about 2 weeks (around 10/05/2019) for follow up back if needed.

## 2019-09-22 LAB — COMPREHENSIVE METABOLIC PANEL
ALT: 76 IU/L — ABNORMAL HIGH (ref 0–44)
AST: 79 IU/L — ABNORMAL HIGH (ref 0–40)
Albumin/Globulin Ratio: 1.2 (ref 1.2–2.2)
Albumin: 3.7 g/dL — ABNORMAL LOW (ref 4.0–5.0)
Alkaline Phosphatase: 193 IU/L — ABNORMAL HIGH (ref 48–121)
BUN/Creatinine Ratio: 15 (ref 9–20)
BUN: 13 mg/dL (ref 6–24)
Bilirubin Total: 1.2 mg/dL (ref 0.0–1.2)
CO2: 26 mmol/L (ref 20–29)
Calcium: 9.3 mg/dL (ref 8.7–10.2)
Chloride: 98 mmol/L (ref 96–106)
Creatinine, Ser: 0.86 mg/dL (ref 0.76–1.27)
GFR calc Af Amer: 119 mL/min/{1.73_m2} (ref >59)
GFR calc non Af Amer: 103 mL/min/{1.73_m2} (ref >59)
Globulin, Total: 3.1 g/dL (ref 1.5–4.5)
Glucose: 184 mg/dL — ABNORMAL HIGH (ref 65–99)
Potassium: 4.1 mmol/L (ref 3.5–5.2)
Sodium: 137 mmol/L (ref 134–144)
Total Protein: 6.8 g/dL (ref 6.0–8.5)

## 2019-10-28 ENCOUNTER — Other Ambulatory Visit: Payer: Self-pay | Admitting: Family Medicine

## 2019-10-31 ENCOUNTER — Encounter: Payer: Self-pay | Admitting: Gastroenterology

## 2019-10-31 ENCOUNTER — Ambulatory Visit: Payer: BC Managed Care – PPO | Admitting: Gastroenterology

## 2019-10-31 ENCOUNTER — Other Ambulatory Visit: Payer: Self-pay

## 2019-10-31 VITALS — BP 164/99 | HR 79 | Temp 98.5°F | Ht 70.0 in | Wt 191.4 lb

## 2019-10-31 DIAGNOSIS — K219 Gastro-esophageal reflux disease without esophagitis: Secondary | ICD-10-CM

## 2019-11-01 ENCOUNTER — Other Ambulatory Visit
Admission: RE | Admit: 2019-11-01 | Discharge: 2019-11-01 | Disposition: A | Discharge status: Home / Self Care | Payer: BC Managed Care – PPO | Source: Ambulatory Visit | Attending: Gastroenterology | Admitting: Gastroenterology

## 2019-11-01 DIAGNOSIS — Z20822 Contact with and (suspected) exposure to covid-19: Secondary | ICD-10-CM | POA: Diagnosis not present

## 2019-11-01 DIAGNOSIS — Z01812 Encounter for preprocedural laboratory examination: Secondary | ICD-10-CM | POA: Diagnosis not present

## 2019-11-01 LAB — SARS CORONAVIRUS 2 (TAT 6-24 HRS): SARS Coronavirus 2: NEGATIVE

## 2019-11-01 NOTE — Progress Notes (Signed)
Stephen Mayo [address]  [address]  [place], [place] [address]  Main: [phone]  Fax: [phone]   Gastroenterology Consultation  Referring Provider:     Dorcas Carrow, DO Primary Care Physician:  Dorcas Carrow, DO Reason for Consultation:    Reflux        HPI:    Chief Complaint  Patient presents with  . Gastroesophageal Reflux    Stephen Mayo is a 49 y.o. y/o male referred for consultation & management  by Dr. Laural Benes, Megan P, DO.  Patient reports chronic AcipHex use due to daily heartburn.  As long as he takes the medication symptoms are well controlled.  However, he does have episodic symptoms consisting of nausea vomiting, every few weeks specifically when he eats spicy foods or fatty meals that would normally cause reflux as well.  Last episode was 2 weeks ago.  No hematemesis.  AcipHex was changed to Dexilant but he has not taken it consistently and has been taking AcipHex since he still had some left at home.  No dysphagia.  No blood in stool.  Has had previous EGDs and colonoscopies due to reflux and family history of colon cancer.  Last upper endoscopy was in 2011 with Dr. Marva Panda for reflux, showed gastric erythema, irregular Z-line, tortuous esophagus, flattened duodenal mucosa.  Pathology report not available.  See colonoscopy report from 2017 in his chart  Past Medical History:  Diagnosis Date  . Allergy   . Back pain   . Diabetes mellitus without complication (HCC)   . GERD (gastroesophageal reflux disease)   . Hyperlipidemia   . Hypertension     Past Surgical History:  Procedure Laterality Date  . COLONOSCOPY WITH PROPOFOL N/A 10/25/2015   Procedure: COLONOSCOPY WITH PROPOFOL;  Surgeon: Scot Jun, MD;  Location: [facility] ENDOSCOPY;  Service: Endoscopy;  Laterality: N/A;  . HAND SURGERY Right 2012   fracture- 5 pins and reconstruction  . HERNIA REPAIR  2002   Double  . NASAL SINUS SURGERY  2008   Maxillary Sinus Removed  .  STERIOD INJECTION    . ulcer on his larynx      Prior to Admission medications   Medication Sig Start Date End Date Taking? Authorizing Provider  azelastine (ASTELIN) 0.1 % nasal spray Place 1 spray into both nostrils 2 (two) times daily. 06/01/19  Yes [provider]  glucose blood (ONE TOUCH ULTRA TEST) test strip USE TO TEST DAILY 12/15/16  Yes Johnson, Megan P, DO  losartan (COZAAR) 50 MG tablet TAKE 1 TABLET DAILY 09/19/19  Yes Johnson, Megan P, DO  naproxen (NAPROSYN) 500 MG tablet Take 1 tablet (500 mg total) by mouth 2 (two) times daily with a meal. 09/21/19  Yes Johnson, Megan P, DO  ONETOUCH DELICA LANCETS FINE MISC USE TO TEST DAILY 12/15/16  Yes Johnson, Megan P, DO  rosuvastatin (CRESTOR) 20 MG tablet Take 1 tablet (20 mg total) by mouth daily. 08/18/19  Yes Johnson, Megan P, DO  triamcinolone (NASACORT ALLERGY 24HR) 55 MCG/ACT AERO nasal inhaler Place 1 spray into the nose daily.   Yes [provider]  cyclobenzaprine (FLEXERIL) 10 MG tablet Take 1 tablet (10 mg total) by mouth at bedtime. Patient not taking: Reported on 10/31/2019 09/21/19   Olevia Perches P, DO  dexlansoprazole (DEXILANT) 60 MG capsule Take 1 capsule (60 mg total) by mouth daily. Patient not taking: Reported on 10/31/2019 08/14/19   Dorcas Carrow, DO    Family  History  Problem Relation Age of Onset  . Cancer Mother        Lung  . Cancer Father        colon  . Cancer Maternal Grandmother        breast, brain and colon  . Lymphoma Paternal Grandmother   . Cancer Paternal Grandfather        lung and throat     Social History   Tobacco Use  . Smoking status: Former Smoker    Quit date: 06/25/2005    Years since quitting: 14.3  . Smokeless tobacco: Former Clinical biochemist  . Vaping Use: Never used  Substance Use Topics  . Alcohol use: Yes    Alcohol/week: 12.0 standard drinks    Types: 10 Cans of beer, 2 Shots of liquor per week  . Drug use: No    Allergies as of 10/31/2019 -  Review Complete 10/31/2019  Allergen Reaction Noted  . Lisinopril Cough 05/30/2015    Review of Systems:    All systems reviewed and negative except where noted in HPI.   Physical Exam:  BP (!) 164/99   Pulse 79   Temp 98.5 F (36.9 C) (Oral)   Ht 5\' 10"  (1.778 m)   Wt 191 lb 6.4 oz (86.8 kg)   BMI 27.46 kg/m  No LMP for male patient. Psych:  Alert and cooperative. Normal mood and affect. General:   Alert,  Well-developed, well-nourished, pleasant and cooperative in NAD Head:  Normocephalic and atraumatic. Eyes:  Sclera clear, no icterus.   Conjunctiva pink. Ears:  Normal auditory acuity. Nose:  No deformity, discharge, or lesions. Mouth:  No deformity or lesions,oropharynx pink & moist. Neck:  Supple; no masses or thyromegaly. Abdomen:  Normal bowel sounds.  No bruits.  Soft, non-tender and non-distended without masses, hepatosplenomegaly or hernias noted.  No guarding or rebound tenderness.    Msk:  Symmetrical without gross deformities. Good, equal movement & strength bilaterally. Pulses:  Normal pulses noted. Extremities:  No clubbing or edema.  No cyanosis. Neurologic:  Alert and oriented x3;  grossly normal neurologically. Skin:  Intact without significant lesions or rashes. No jaundice. Lymph Nodes:  No significant cervical adenopathy. Psych:  Alert and cooperative. Normal mood and affect.   Labs: CBC    Component Value Date/Time   WBC 6.7 08/14/2019 1617   RBC 4.54 08/14/2019 1617   HGB 14.2 08/14/2019 1617   HCT 42.2 08/14/2019 1617   PLT 195 08/14/2019 1617   MCV 93 08/14/2019 1617   MCH 31.3 08/14/2019 1617   MCHC 33.6 08/14/2019 1617   RDW 13.1 08/14/2019 1617   LYMPHSABS 1.2 08/14/2019 1617   EOSABS 0.0 08/14/2019 1617   BASOSABS 0.0 08/14/2019 1617   CMP     Component Value Date/Time   NA 137 09/21/2019 1026   K 4.1 09/21/2019 1026   CL 98 09/21/2019 1026   CO2 26 09/21/2019 1026   GLUCOSE 184 (H) 09/21/2019 1026   BUN 13 09/21/2019 1026    CREATININE 0.86 09/21/2019 1026   CALCIUM 9.3 09/21/2019 1026   PROT 6.8 09/21/2019 1026   ALBUMIN 3.7 (L) 09/21/2019 1026   AST 79 (H) 09/21/2019 1026   ALT 76 (H) 09/21/2019 1026   ALKPHOS 193 (H) 09/21/2019 1026   BILITOT 1.2 09/21/2019 1026   GFRNONAA 103 09/21/2019 1026   GFRAA 119 09/21/2019 1026    Imaging Studies: No results found.  Assessment and Plan:   ZEBBIE ACE is  a 49 y.o. y/o male has been referred for reflux  Patient is having both breakthrough symptoms, and new symptoms which include intermittent nausea and vomiting with exacerbating foods that were not present before, despite taking PPI  Therefore, EGD indicated due to the above  I have discussed alternative options, risks & benefits,  which include, but are not limited to, bleeding, infection, perforation,respiratory complication & drug reaction.  The patient agrees with this plan & written consent will be obtained.    Continue Dexilant in the meantime  (Risks of PPI use were discussed with patient including bone loss, C. Diff diarrhea, pneumonia, infections, CKD, electrolyte abnormalities.  Pt. Verbalizes understanding and chooses to continue the medication.)  Patient educated extensively on acid reflux lifestyle modification, including buying a bed wedge, not eating 3 hrs before bedtime, diet modifications, and handout given for the same.    Dr Stephen Mayo  Speech recognition software was used to dictate the above note.

## 2019-11-02 ENCOUNTER — Encounter
Admission: RE | Disposition: A | Discharge status: Home / Self Care | Payer: Self-pay | Source: Home / Self Care | Attending: Gastroenterology

## 2019-11-02 ENCOUNTER — Encounter: Payer: Self-pay | Admitting: Gastroenterology

## 2019-11-02 ENCOUNTER — Ambulatory Visit: Payer: BC Managed Care – PPO | Admitting: Anesthesiology

## 2019-11-02 ENCOUNTER — Ambulatory Visit
Admission: RE | Admit: 2019-11-02 | Discharge: 2019-11-02 | Disposition: A | Discharge status: Home / Self Care | Payer: BC Managed Care – PPO | Attending: Gastroenterology | Admitting: Gastroenterology

## 2019-11-02 ENCOUNTER — Other Ambulatory Visit: Payer: Self-pay

## 2019-11-02 DIAGNOSIS — K317 Polyp of stomach and duodenum: Secondary | ICD-10-CM | POA: Diagnosis not present

## 2019-11-02 DIAGNOSIS — Z800 Family history of malignant neoplasm of digestive organs: Secondary | ICD-10-CM | POA: Diagnosis not present

## 2019-11-02 DIAGNOSIS — K3189 Other diseases of stomach and duodenum: Secondary | ICD-10-CM | POA: Diagnosis not present

## 2019-11-02 DIAGNOSIS — I10 Essential (primary) hypertension: Secondary | ICD-10-CM | POA: Insufficient documentation

## 2019-11-02 DIAGNOSIS — R12 Heartburn: Secondary | ICD-10-CM | POA: Diagnosis not present

## 2019-11-02 DIAGNOSIS — Z888 Allergy status to other drugs, medicaments and biological substances status: Secondary | ICD-10-CM | POA: Insufficient documentation

## 2019-11-02 DIAGNOSIS — Z791 Long term (current) use of non-steroidal anti-inflammatories (NSAID): Secondary | ICD-10-CM | POA: Insufficient documentation

## 2019-11-02 DIAGNOSIS — K219 Gastro-esophageal reflux disease without esophagitis: Secondary | ICD-10-CM | POA: Insufficient documentation

## 2019-11-02 DIAGNOSIS — L538 Other specified erythematous conditions: Secondary | ICD-10-CM | POA: Diagnosis not present

## 2019-11-02 DIAGNOSIS — Z808 Family history of malignant neoplasm of other organs or systems: Secondary | ICD-10-CM | POA: Diagnosis not present

## 2019-11-02 DIAGNOSIS — K2950 Unspecified chronic gastritis without bleeding: Secondary | ICD-10-CM | POA: Diagnosis not present

## 2019-11-02 DIAGNOSIS — M549 Dorsalgia, unspecified: Secondary | ICD-10-CM | POA: Diagnosis not present

## 2019-11-02 DIAGNOSIS — E119 Type 2 diabetes mellitus without complications: Secondary | ICD-10-CM | POA: Insufficient documentation

## 2019-11-02 DIAGNOSIS — R1319 Other dysphagia: Secondary | ICD-10-CM | POA: Diagnosis not present

## 2019-11-02 DIAGNOSIS — R112 Nausea with vomiting, unspecified: Secondary | ICD-10-CM

## 2019-11-02 DIAGNOSIS — R1310 Dysphagia, unspecified: Secondary | ICD-10-CM | POA: Insufficient documentation

## 2019-11-02 DIAGNOSIS — K2100 Gastro-esophageal reflux disease with esophagitis, without bleeding: Secondary | ICD-10-CM | POA: Diagnosis not present

## 2019-11-02 DIAGNOSIS — Z803 Family history of malignant neoplasm of breast: Secondary | ICD-10-CM | POA: Diagnosis not present

## 2019-11-02 DIAGNOSIS — E785 Hyperlipidemia, unspecified: Secondary | ICD-10-CM | POA: Diagnosis not present

## 2019-11-02 DIAGNOSIS — Z87891 Personal history of nicotine dependence: Secondary | ICD-10-CM | POA: Insufficient documentation

## 2019-11-02 DIAGNOSIS — Z801 Family history of malignant neoplasm of trachea, bronchus and lung: Secondary | ICD-10-CM | POA: Diagnosis not present

## 2019-11-02 DIAGNOSIS — Q402 Other specified congenital malformations of stomach: Secondary | ICD-10-CM

## 2019-11-02 DIAGNOSIS — R1110 Vomiting, unspecified: Secondary | ICD-10-CM

## 2019-11-02 DIAGNOSIS — Z79899 Other long term (current) drug therapy: Secondary | ICD-10-CM | POA: Diagnosis not present

## 2019-11-02 HISTORY — PX: ESOPHAGOGASTRODUODENOSCOPY (EGD) WITH PROPOFOL: SHX5813

## 2019-11-02 SURGERY — ESOPHAGOGASTRODUODENOSCOPY (EGD) WITH PROPOFOL
Anesthesia: General

## 2019-11-02 MED ORDER — SODIUM CHLORIDE 0.9 % IV SOLN: INTRAVENOUS | Status: DC

## 2019-11-02 MED ORDER — LIDOCAINE HCL (PF) 2 % IJ SOLN
INTRAMUSCULAR | Status: AC
  Filled 2019-11-02: qty 5

## 2019-11-02 MED ORDER — MIDAZOLAM HCL 2 MG/2ML IJ SOLN
INTRAMUSCULAR | Status: AC
  Filled 2019-11-02: qty 2

## 2019-11-02 MED ORDER — LIDOCAINE HCL (CARDIAC) PF 100 MG/5ML IV SOSY
PREFILLED_SYRINGE | INTRAVENOUS | Status: DC | PRN
  Administered 2019-11-02: 50 mg via INTRAVENOUS

## 2019-11-02 MED ORDER — PROPOFOL 500 MG/50ML IV EMUL
INTRAVENOUS | Status: AC
  Filled 2019-11-02: qty 50

## 2019-11-02 MED ORDER — MIDAZOLAM HCL 2 MG/2ML IJ SOLN
INTRAMUSCULAR | Status: DC | PRN
  Administered 2019-11-02: 2 mg via INTRAVENOUS

## 2019-11-02 MED ORDER — PROPOFOL 500 MG/50ML IV EMUL
INTRAVENOUS | Status: DC | PRN
  Administered 2019-11-02: 150 ug/kg/min via INTRAVENOUS

## 2019-11-02 NOTE — Anesthesia Preprocedure Evaluation (Signed)
Anesthesia Evaluation  Patient identified by MRN, date of birth, ID band Patient awake    Reviewed: Allergy & Precautions, H&P , NPO status , Patient's Chart, lab work & pertinent test results  History of Anesthesia Complications Negative for: history of anesthetic complications  Airway Mallampati: II  TM Distance: >3 FB     Dental  (+) Teeth Intact   Pulmonary neg sleep apnea, neg COPD, former smoker,           Cardiovascular hypertension, (-) angina(-) Past MI and (-) Cardiac Stents (-) dysrhythmias      Neuro/Psych negative neurological ROS  negative psych ROS   GI/Hepatic Neg liver ROS, GERD  ,  Endo/Other  diabetes  Renal/GU negative Renal ROS  negative genitourinary   Musculoskeletal   Abdominal   Peds  Hematology negative hematology ROS (+)   Anesthesia Other Findings Past Medical History: No date: Allergy No date: Back pain No date: Diabetes mellitus without complication (HCC) No date: GERD (gastroesophageal reflux disease) No date: Hyperlipidemia No date: Hypertension  Past Surgical History: 10/25/2015: COLONOSCOPY WITH PROPOFOL; N/A     Comment:  Procedure: COLONOSCOPY WITH PROPOFOL;  Surgeon: Scot Jun, MD;  Location[facility] ENDOSCOPY;  Service:               Endoscopy;  Laterality: N/A; 2012: HAND SURGERY; Right     Comment:  fracture- 5 pins and reconstruction 2002: HERNIA REPAIR     Comment:  Double 2008: NASAL SINUS SURGERY     Comment:  Maxillary Sinus Removed No date: STERIOD INJECTION No date: ulcer on his larynx  BMI    Body Mass Index: 27.41 kg/m      Reproductive/Obstetrics negative OB ROS                             Anesthesia Physical Anesthesia Plan  ASA: II  Anesthesia Plan: General   Post-op Pain Management:    Induction:   PONV Risk Score and Plan: Propofol infusion and TIVA  Airway Management Planned: Nasal  Cannula  Additional Equipment:   Intra-op Plan:   Post-operative Plan:   Informed Consent: I have reviewed the patients History and Physical, chart, labs and discussed the procedure including the risks, benefits and alternatives for the proposed anesthesia with the patient or authorized representative who has indicated his/her understanding and acceptance.     Dental Advisory Given  Plan Discussed with: Anesthesiologist, CRNA and Surgeon  Anesthesia Plan Comments:         Anesthesia Quick Evaluation

## 2019-11-02 NOTE — Anesthesia Postprocedure Evaluation (Signed)
Anesthesia Post Note  Patient: Stephen Mayo  Procedure(s) Performed: ESOPHAGOGASTRODUODENOSCOPY (EGD) WITH PROPOFOL (N/A )  Patient location during evaluation: Endoscopy Anesthesia Type: General Level of consciousness: awake and alert Pain management: pain level controlled Vital Signs Assessment: post-procedure vital signs reviewed and stable Respiratory status: spontaneous breathing, nonlabored ventilation, respiratory function stable and patient connected to nasal cannula oxygen Cardiovascular status: blood pressure returned to baseline and stable Postop Assessment: no apparent nausea or vomiting Anesthetic complications: no   No complications documented.   Last Vitals:  Vitals:   11/02/19 0945 11/02/19 1005  BP:  (!) 149/86  Pulse: 77 75  Resp: 16 16  Temp: (!) 36.1 C   SpO2: 96% 99%    Last Pain:  Vitals:   11/02/19 0945  TempSrc: Temporal  PainSc: Asleep                 Stephen Mayo

## 2019-11-02 NOTE — H&P (Signed)
Melodie Bouillon, MD [address], [place], [place], [address] [address], [place], [place], [address] Phone: [phone]  Fax: [phone]  Primary Care Physician:  Dorcas Carrow, DO   Pre-Procedure History & Physical: HPI:  Stephen Mayo is a 49 y.o. male is here for an EGD.   Past Medical History:  Diagnosis Date  . Allergy   . Back pain   . Diabetes mellitus without complication (HCC)   . GERD (gastroesophageal reflux disease)   . Hyperlipidemia   . Hypertension     Past Surgical History:  Procedure Laterality Date  . COLONOSCOPY WITH PROPOFOL N/A 10/25/2015   Procedure: COLONOSCOPY WITH PROPOFOL;  Surgeon: Scot Jun, MD;  Location: [facility] ENDOSCOPY;  Service: Endoscopy;  Laterality: N/A;  . HAND SURGERY Right 2012   fracture- 5 pins and reconstruction  . HERNIA REPAIR  2002   Double  . NASAL SINUS SURGERY  2008   Maxillary Sinus Removed  . STERIOD INJECTION    . ulcer on his larynx      Prior to Admission medications   Medication Sig Start Date End Date Taking? Authorizing Provider  azelastine (ASTELIN) 0.1 % nasal spray Place 1 spray into both nostrils 2 (two) times daily. 06/01/19  Yes [provider]  losartan (COZAAR) 50 MG tablet TAKE 1 TABLET DAILY 09/19/19  Yes Johnson, Megan P, DO  naproxen (NAPROSYN) 500 MG tablet Take 1 tablet (500 mg total) by mouth 2 (two) times daily with a meal. 09/21/19  Yes Johnson, Megan P, DO  triamcinolone (NASACORT ALLERGY 24HR) 55 MCG/ACT AERO nasal inhaler Place 1 spray into the nose daily.   Yes [provider]  cyclobenzaprine (FLEXERIL) 10 MG tablet Take 1 tablet (10 mg total) by mouth at bedtime. Patient not taking: Reported on 10/31/2019 09/21/19   Olevia Perches P, DO  dexlansoprazole (DEXILANT) 60 MG capsule Take 1 capsule (60 mg total) by mouth daily. Patient not taking: Reported on 10/31/2019 08/14/19   Olevia Perches P, DO  glucose blood (ONE TOUCH ULTRA TEST) test strip  USE TO TEST DAILY 12/15/16   Laural Benes, Megan P, DO  ONETOUCH DELICA LANCETS FINE MISC USE TO TEST DAILY 12/15/16   Olevia Perches P, DO  rosuvastatin (CRESTOR) 20 MG tablet Take 1 tablet (20 mg total) by mouth daily. Patient not taking: Reported on 11/02/2019 08/18/19   Olevia Perches P, DO    Allergies as of 11/01/2019 - Review Complete 10/31/2019  Allergen Reaction Noted  . Lisinopril Cough 05/30/2015    Family History  Problem Relation Age of Onset  . Cancer Mother        Lung  . Cancer Father        colon  . Cancer Maternal Grandmother        breast, brain and colon  . Lymphoma Paternal Grandmother   . Cancer Paternal Grandfather        lung and throat    Social History   Socioeconomic History  . Marital status: Married    Spouse name: Not on file  . Number of children: Not on file  . Years of education: Not on file  . Highest education level: Not on file  Occupational History  . Not on file  Tobacco Use  . Smoking status: Former Smoker    Quit date: 06/25/2005    Years since quitting: 14.3  . Smokeless tobacco: Former Clinical biochemist  . Vaping Use: Never used  Substance and  Sexual Activity  . Alcohol use: Yes    Alcohol/week: 12.0 standard drinks    Types: 10 Cans of beer, 2 Shots of liquor per week  . Drug use: No  . Sexual activity: Yes  Other Topics Concern  . Not on file  Social History Narrative  . Not on file   Social Determinants of Health   Financial Resource Strain:   . Difficulty of Paying Living Expenses: Not on file  Food Insecurity:   . Worried About Programme researcher, broadcasting/film/video in the Last Year: Not on file  . Ran Out of Food in the Last Year: Not on file  Transportation Needs:   . Lack of Transportation (Medical): Not on file  . Lack of Transportation (Non-Medical): Not on file  Physical Activity:   . Days of Exercise per Week: Not on file  . Minutes of Exercise per Session: Not on file  Stress:   . Feeling of Stress : Not on file  Social  Connections:   . Frequency of Communication with Friends and Family: Not on file  . Frequency of Social Gatherings with Friends and Family: Not on file  . Attends Religious Services: Not on file  . Active Member of Clubs or Organizations: Not on file  . Attends Banker Meetings: Not on file  . Marital Status: Not on file  Intimate Partner Violence:   . Fear of Current or Ex-Partner: Not on file  . Emotionally Abused: Not on file  . Physically Abused: Not on file  . Sexually Abused: Not on file    Review of Systems: See HPI, otherwise negative ROS  Physical Exam: BP (!) 145/88   Pulse 69   Temp (!) 97.5 F (36.4 C)   Resp 17   Ht 5\' 10"  (1.778 m)   Wt 86.6 kg   SpO2 98%   BMI 27.41 kg/m  General:   Alert,  pleasant and cooperative in NAD Head:  Normocephalic and atraumatic. Neck:  Supple; no masses or thyromegaly. Lungs:  Clear throughout to auscultation, normal respiratory effort.    Heart:  +S1, +S2, Regular rate and rhythm, No edema. Abdomen:  Soft, nontender and nondistended. Normal bowel sounds, without guarding, and without rebound.   Neurologic:  Alert and  oriented x4;  grossly normal neurologically.  Impression/Plan: is here for an EGD for Acid Reflux.  Risks, benefits, limitations, and alternatives regarding the procedure have been reviewed with the patient.  Questions have been answered.  All parties agreeable.   [name], MD  11/02/2019, 9:12 AM

## 2019-11-02 NOTE — Anesthesia Procedure Notes (Signed)
Performed by: Cook-Martin, Siraj Dermody Pre-anesthesia Checklist: Patient identified, Emergency Drugs available, Suction available, Patient being monitored and Timeout performed Patient Re-evaluated:Patient Re-evaluated prior to induction Oxygen Delivery Method: Nasal cannula Preoxygenation: Pre-oxygenation with 100% oxygen Induction Type: IV induction Airway Equipment and Method: Bite block Placement Confirmation: CO2 detector and positive ETCO2       

## 2019-11-02 NOTE — Op Note (Signed)
[facility] Gastroenterology Patient Name: Stephen Mayo Procedure Date: 11/02/2019 9:12 AM MRN: [id number] Account #: [id number] Date of Birth: 1970-04-15 Admit Type: Outpatient Age: 49 Room: [facility] ENDO ROOM 3 Gender: Male Note Status: Finalized Procedure:             Upper GI endoscopy Indications:           Dysphagia, Heartburn, Globus sensation, Nausea with                         vomiting Providers:             Maryelizabeth Eberle B. Maximino Greenland MD, MD Referring MD:          Dorcas Carrow (Referring MD) Medicines:             Monitored Anesthesia Care Complications:         No immediate complications. Procedure:             Pre-Anesthesia Assessment:                        - Prior to the procedure, a History and Physical was                         performed, and patient medications, allergies and                         sensitivities were reviewed. The patient's tolerance                         of previous anesthesia was reviewed.                        - The risks and benefits of the procedure and the                         sedation options and risks were discussed with the                         patient. All questions were answered and informed                         consent was obtained.                        - Patient identification and proposed procedure were                         verified prior to the procedure by the physician, the                         nurse, the anesthesiologist, the anesthetist and the                         technician. The procedure was verified in the                         procedure room.                        - ASA Grade Assessment: II - A patient  with mild                         systemic disease.                        After obtaining informed consent, the endoscope was                         passed under direct vision. Throughout the procedure,                         the patient's blood pressure, pulse, and oxygen                          saturations were monitored continuously. The Endoscope                         was introduced through the mouth, and advanced to the                         second part of duodenum. The upper GI endoscopy was                         accomplished with ease. The patient tolerated the                         procedure well. Findings:      A single area of ectopic gastric mucosa was found in the upper third of       the esophagus.      The examined esophagus was normal. Biopsies were obtained from the       proximal and distal esophagus with cold forceps for histology of       suspected eosinophilic esophagitis.      Patchy mildly erythematous mucosa without bleeding was found in the       gastric antrum. Biopsies were taken with a cold forceps for histology.       Biopsies were obtained in the gastric body, at the incisura and in the       gastric antrum with cold forceps for histology.      A few 3 to 4 mm sessile polyps with no bleeding and no stigmata of       recent bleeding were found in the gastric fundus. Biopsies were taken       with a cold forceps for histology.      The exam of the stomach was otherwise normal.      The duodenal bulb, second portion of the duodenum and examined duodenum       were normal. Impression:            - Ectopic gastric mucosa in the upper third of the                         esophagus.                        - Normal esophagus. Biopsied.                        - Erythematous mucosa in the antrum. Biopsied.                        -  A few gastric polyps. Biopsied.                        - Normal duodenal bulb, second portion of the duodenum                         and examined duodenum.                        - Biopsies were obtained in the gastric body, at the                         incisura and in the gastric antrum. Recommendation:        - Await pathology results.                        - Discharge patient to home (with escort).                         - Advance diet as tolerated.                        - Continue present medications.                        - Patient has a contact number available for                         emergencies. The signs and symptoms of potential                         delayed complications were discussed with the patient.                         Return to normal activities tomorrow. Written                         discharge instructions were provided to the patient.                        - Discharge patient to home (with escort).                        - The findings and recommendations were discussed with                         the patient.                        - The findings and recommendations were discussed with                         the patient's family. Procedure Code(s):     --- Professional ---                        [phone], Esophagogastroduodenoscopy, flexible,                         transoral; with biopsy, single or multiple Diagnosis Code(s):     --- Professional ---  K31.89, Other diseases of stomach and duodenum                        K31.7, Polyp of stomach and duodenum                        R13.10, Dysphagia, unspecified                        R12, Heartburn                        R11.2, Nausea with vomiting, unspecified                        F45.8, Other somatoform disorders CPT copyright 2019 [employer]. All rights reserved. The codes documented in this report are preliminary and upon coder review may  be revised to meet current compliance requirements.  Melodie Bouillon, MD Michel Bickers B. Maximino Greenland MD, MD 11/02/2019 9:47:27 AM This report has been signed electronically. Number of Addenda: 0 Note Initiated On: 11/02/2019 9:12 AM Estimated Blood Loss:  Estimated blood loss: none.      [facility]

## 2019-11-02 NOTE — Transfer of Care (Signed)
Immediate Anesthesia Transfer of Care Note  Patient: Stephen Mayo  Procedure(s) Performed: ESOPHAGOGASTRODUODENOSCOPY (EGD) WITH PROPOFOL (N/A )  Patient Location: PACU  Anesthesia Type:General  Level of Consciousness: awake and sedated  Airway & Oxygen Therapy: Patient Spontanous Breathing and Patient connected to nasal cannula oxygen  Post-op Assessment: Report given to RN and Post -op Vital signs reviewed and stable  Post vital signs: Reviewed and stable  Last Vitals:  Vitals Value Taken Time  BP    Temp    Pulse    Resp    SpO2      Last Pain:  Vitals:   11/02/19 0806  PainSc: 0-No pain         Complications: No complications documented.

## 2019-11-03 ENCOUNTER — Encounter: Payer: Self-pay | Admitting: Gastroenterology

## 2019-11-03 ENCOUNTER — Other Ambulatory Visit: Payer: Self-pay | Admitting: Family Medicine

## 2019-11-03 LAB — SURGICAL PATHOLOGY

## 2019-11-14 ENCOUNTER — Other Ambulatory Visit: Payer: Self-pay | Admitting: Family Medicine

## 2019-11-28 ENCOUNTER — Other Ambulatory Visit: Payer: Self-pay

## 2019-11-28 NOTE — Telephone Encounter (Signed)
Patient requesting to have prescriptions sent to [facility] instead of local pharmacy.

## 2019-11-29 MED ORDER — NAPROXEN 500 MG PO TABS
500.0000 mg | ORAL_TABLET | Freq: Two times a day (BID) | ORAL | 3 refills | Status: DC
Start: 2019-11-29 — End: 2020-08-16

## 2019-11-29 MED ORDER — ROSUVASTATIN CALCIUM 20 MG PO TABS
20.0000 mg | ORAL_TABLET | Freq: Every day | ORAL | 1 refills | Status: DC
Start: 2019-11-29 — End: 2020-02-15

## 2019-12-28 DIAGNOSIS — S56911A Strain of unspecified muscles, fascia and tendons at forearm level, right arm, initial encounter: Secondary | ICD-10-CM | POA: Diagnosis not present

## 2020-01-01 DIAGNOSIS — S56911A Strain of unspecified muscles, fascia and tendons at forearm level, right arm, initial encounter: Secondary | ICD-10-CM | POA: Diagnosis not present

## 2020-01-08 DIAGNOSIS — Z7984 Long term (current) use of oral hypoglycemic drugs: Secondary | ICD-10-CM | POA: Diagnosis not present

## 2020-01-08 DIAGNOSIS — H524 Presbyopia: Secondary | ICD-10-CM | POA: Diagnosis not present

## 2020-01-08 DIAGNOSIS — E119 Type 2 diabetes mellitus without complications: Secondary | ICD-10-CM | POA: Diagnosis not present

## 2020-01-08 DIAGNOSIS — H5213 Myopia, bilateral: Secondary | ICD-10-CM | POA: Diagnosis not present

## 2020-01-08 LAB — HM DIABETES EYE EXAM

## 2020-01-22 DIAGNOSIS — S56911A Strain of unspecified muscles, fascia and tendons at forearm level, right arm, initial encounter: Secondary | ICD-10-CM | POA: Diagnosis not present

## 2020-02-01 ENCOUNTER — Encounter: Payer: Self-pay | Admitting: Gastroenterology

## 2020-02-01 ENCOUNTER — Ambulatory Visit: Payer: BC Managed Care – PPO | Admitting: Gastroenterology

## 2020-02-01 ENCOUNTER — Other Ambulatory Visit: Payer: Self-pay

## 2020-02-01 VITALS — BP 110/78 | HR 80 | Temp 98.7°F | Wt 194.0 lb

## 2020-02-01 DIAGNOSIS — K219 Gastro-esophageal reflux disease without esophagitis: Secondary | ICD-10-CM | POA: Diagnosis not present

## 2020-02-01 NOTE — Patient Instructions (Signed)
Please stop taking your Dexilant.   We will contact you in 6 months to schedule you an EGD.  We will contact you in a year to schedule you an appointment with Dr. Maximino Greenland for a follow up.

## 2020-02-02 NOTE — Progress Notes (Signed)
Melodie Bouillon, MD [address]  [address]  [place], [place] [address]  Main: [phone]  Fax: [phone]   Primary Care Physician: Dorcas Carrow, DO   Chief Complaint  Patient presents with  . Gastroesophageal Reflux    HPI: Stephen Mayo is a 50 y.o. male here for follow-up of reflux.  Patient has been taking Dexilant and reports complete symptom resolution since then.  No breakthrough heartburn, regurgitation.  Denies any abdominal pain or dysphagia.  Was previously taking Mobic and has discontinued this as well.  See recent endoscopy reports  Current Outpatient Medications  Medication Sig Dispense Refill  . azelastine (ASTELIN) 0.1 % nasal spray Place 1 spray into both nostrils 2 (two) times daily.    . cyclobenzaprine (FLEXERIL) 10 MG tablet Take 1 tablet (10 mg total) by mouth at bedtime. 30 tablet 0  . dexlansoprazole (DEXILANT) 60 MG capsule Take 1 capsule (60 mg total) by mouth daily. 90 capsule 1  . glucose blood (ONE TOUCH ULTRA TEST) test strip USE TO TEST DAILY 100 each 4  . losartan (COZAAR) 50 MG tablet TAKE 1 TABLET DAILY 90 tablet 3  . meloxicam (MOBIC) 15 MG tablet Take 15 mg by mouth daily.    . naproxen (NAPROSYN) 500 MG tablet Take 1 tablet (500 mg total) by mouth 2 (two) times daily with a meal. 60 tablet 3  . ONETOUCH DELICA LANCETS FINE MISC USE TO TEST DAILY 100 each 4  . predniSONE (DELTASONE) 5 MG tablet Take 1 tablet by mouth daily.    . rosuvastatin (CRESTOR) 20 MG tablet Take 1 tablet (20 mg total) by mouth daily. 90 tablet 1   No current facility-administered medications for this visit.    Allergies as of 02/01/2020 - Review Complete 02/01/2020  Allergen Reaction Noted  . Lisinopril Cough 05/30/2015    ROS:  General: Negative for anorexia, weight loss, fever, chills, fatigue, weakness. ENT: Negative for hoarseness, difficulty swallowing , nasal congestion. CV: Negative for chest pain, angina, palpitations, dyspnea on  exertion, peripheral edema.  Respiratory: Negative for dyspnea at rest, dyspnea on exertion, cough, sputum, wheezing.  GI: See history of present illness. GU:  Negative for dysuria, hematuria, urinary incontinence, urinary frequency, nocturnal urination.  Endo: Negative for unusual weight change.    Physical Examination:   BP 110/78   Pulse 80   Temp 98.7 F (37.1 C) (Oral)   Wt 194 lb (88 kg)   BMI 27.84 kg/m   General: Well-nourished, well-developed in no acute distress.  Eyes: No icterus. Conjunctivae pink. Mouth: Oropharyngeal mucosa moist and pink , no lesions erythema or exudate. Neck: Supple, Trachea midline Abdomen: Bowel sounds are normal, nontender, nondistended, no hepatosplenomegaly or masses, no abdominal bruits or hernia , no rebound or guarding.   Extremities: No lower extremity edema. No clubbing or deformities. Neuro: Alert and oriented x 3.  Grossly intact. Skin: Warm and dry, no jaundice.   Psych: Alert and cooperative, normal mood and affect.   Labs: CMP     Component Value Date/Time   NA 137 09/21/2019 1026   K 4.1 09/21/2019 1026   CL 98 09/21/2019 1026   CO2 26 09/21/2019 1026   GLUCOSE 184 (H) 09/21/2019 1026   BUN 13 09/21/2019 1026   CREATININE 0.86 09/21/2019 1026   CALCIUM 9.3 09/21/2019 1026   PROT 6.8 09/21/2019 1026   ALBUMIN 3.7 (L) 09/21/2019 1026   AST 79 (H) 09/21/2019 1026   ALT 76 (H) 09/21/2019  1026   ALKPHOS 193 (H) 09/21/2019 1026   BILITOT 1.2 09/21/2019 1026   GFRNONAA 103 09/21/2019 1026   GFRAA 119 09/21/2019 1026   Lab Results  Component Value Date   WBC 6.7 08/14/2019   HGB 14.2 08/14/2019   HCT 42.2 08/14/2019   MCV 93 08/14/2019   PLT 195 08/14/2019    Imaging Studies: No results found.  Assessment and Plan:   Stephen Mayo is a 67 y.o. y/o male here for follow-up of reflux and reporting complete symptom resolution at this time  Patient agreeable to try and discontinue Dexilant.  He states insurance is  not covering any further refills either  We discussed discontinuation of Dexilant and if symptoms return patient can call us back and we can start with H2 RA therapy and if that does not resolve his symptoms, to consider alternative PPI that is covered by his insurance  (Risks of PPI use were discussed with patient including bone loss, C. Diff diarrhea, pneumonia, infections, CKD, electrolyte abnormalities.  Pt. Verbalizes understanding and understands that if his symptoms recur and further refills are needed, the above would be adverse effects associated with the medications)  Patient educated extensively on acid reflux lifestyle modification, including buying a bed wedge, not eating 3 hrs before bedtime, diet modifications, and handout given for the same.      Dr Melodie Bouillon

## 2020-02-12 DIAGNOSIS — M7711 Lateral epicondylitis, right elbow: Secondary | ICD-10-CM | POA: Diagnosis not present

## 2020-02-14 DIAGNOSIS — M7711 Lateral epicondylitis, right elbow: Secondary | ICD-10-CM | POA: Diagnosis not present

## 2020-02-15 ENCOUNTER — Other Ambulatory Visit: Payer: Self-pay

## 2020-02-15 ENCOUNTER — Encounter: Payer: Self-pay | Admitting: Family Medicine

## 2020-02-15 ENCOUNTER — Ambulatory Visit (INDEPENDENT_AMBULATORY_CARE_PROVIDER_SITE_OTHER): Payer: BC Managed Care – PPO | Admitting: Family Medicine

## 2020-02-15 VITALS — BP 136/72 | HR 76 | Temp 97.8°F | Wt 216.0 lb

## 2020-02-15 DIAGNOSIS — E119 Type 2 diabetes mellitus without complications: Secondary | ICD-10-CM

## 2020-02-15 DIAGNOSIS — E782 Mixed hyperlipidemia: Secondary | ICD-10-CM | POA: Diagnosis not present

## 2020-02-15 DIAGNOSIS — I10 Essential (primary) hypertension: Secondary | ICD-10-CM | POA: Diagnosis not present

## 2020-02-15 LAB — BAYER DCA HB A1C WAIVED: HB A1C (BAYER DCA - WAIVED): 7.3 % — ABNORMAL HIGH (ref <7.0)

## 2020-02-15 MED ORDER — ROSUVASTATIN CALCIUM 20 MG PO TABS
20.0000 mg | ORAL_TABLET | Freq: Every day | ORAL | 1 refills | Status: DC
Start: 2020-02-15 — End: 2020-08-16

## 2020-02-15 MED ORDER — DEXLANSOPRAZOLE 60 MG PO CPDR
60.0000 mg | DELAYED_RELEASE_CAPSULE | Freq: Every day | ORAL | 1 refills | Status: DC
Start: 2020-02-15 — End: 2020-08-16

## 2020-02-15 NOTE — Progress Notes (Signed)
BP 136/72    Pulse 76    Temp 97.8 F (36.6 C)    Wt 216 lb (98 kg)    SpO2 99%    BMI 30.99 kg/m    Subjective:    Patient ID: Stephen Mayo, male    DOB: [birth year], 50 y.o.   MRN: [id number]  HPI: Stephen Mayo is a 50 y.o. male  Chief Complaint  Patient presents with   Diabetes   Hypertension   Hyperlipidemia   HYPERTENSION / HYPERLIPIDEMIA Satisfied with current treatment? yes Duration of hypertension: chronic BP monitoring frequency: not checking BP medication side effects: no Past BP meds: losartan Duration of hyperlipidemia: chronic Cholesterol medication side effects: no Cholesterol supplements: none Past cholesterol medications: crestor Medication compliance: excellent compliance Aspirin: no Recent stressors: no Recurrent headaches: no Visual changes: no Palpitations: no Dyspnea: no Chest pain: no Lower extremity edema: no Dizzy/lightheaded: no  DIABETES Hypoglycemic episodes:no Polydipsia/polyuria: no Visual disturbance: no Chest pain: no Paresthesias: no Glucose Monitoring: no Taking Insulin?: no Blood Pressure Monitoring: not checking Retinal Examination: Not up to Date Foot Exam: Up to Date Diabetic Education: Not Completed Pneumovax: Up to Date Influenza: Up to Date Aspirin: yes  Relevant past medical, surgical, family and social history reviewed and updated as indicated. Interim medical history since our last visit reviewed. Allergies and medications reviewed and updated.  Review of Systems  Constitutional: Negative.   HENT: Negative.   Respiratory: Negative.   Cardiovascular: Negative.   Gastrointestinal: Negative.   Musculoskeletal: Negative.   Psychiatric/Behavioral: Negative.     Per HPI unless specifically indicated above     Objective:    BP 136/72    Pulse 76    Temp 97.8 F (36.6 C)    Wt 216 lb (98 kg)    SpO2 99%    BMI 30.99 kg/m   Wt Readings from Last 3 Encounters:  02/15/20 216 lb (98 kg)  02/01/20  194 lb (88 kg)  11/02/19 191 lb (86.6 kg)    Physical Exam Vitals and nursing note reviewed.  Constitutional:      General: He is not in acute distress.    Appearance: Normal appearance. He is not ill-appearing, toxic-appearing or diaphoretic.  HENT:     Head: Normocephalic and atraumatic.     Right Ear: External ear normal.     Left Ear: External ear normal.     Nose: Nose normal.     Mouth/Throat:     Mouth: Mucous membranes are moist.     Pharynx: Oropharynx is clear.  Eyes:     General: No scleral icterus.       Right eye: No discharge.        Left eye: No discharge.     Extraocular Movements: Extraocular movements intact.     Conjunctiva/sclera: Conjunctivae normal.     Pupils: Pupils are equal, round, and reactive to light.  Cardiovascular:     Rate and Rhythm: Normal rate and regular rhythm.     Pulses: Normal pulses.     Heart sounds: Normal heart sounds. No murmur heard. No friction rub. No gallop.   Pulmonary:     Effort: Pulmonary effort is normal. No respiratory distress.     Breath sounds: Normal breath sounds. No stridor. No wheezing, rhonchi or rales.  Chest:     Chest wall: No tenderness.  Musculoskeletal:        General: Normal range of motion.     Cervical back: Normal range  of motion and neck supple.  Skin:    General: Skin is warm and dry.     Capillary Refill: Capillary refill takes less than 2 seconds.     Coloration: Skin is not jaundiced or pale.     Findings: No bruising, erythema, lesion or rash.  Neurological:     General: No focal deficit present.     Mental Status: He is alert and oriented to person, place, and time. Mental status is at baseline.  Psychiatric:        Mood and Affect: Mood normal.        Behavior: Behavior normal.        Thought Content: Thought content normal.        Judgment: Judgment normal.     Results for orders placed or performed in visit on 02/15/20  Comprehensive metabolic panel  Result Value Ref Range    Glucose 245 (H) 65 - 99 mg/dL   BUN 21 6 - 24 mg/dL   Creatinine, Ser 5.64 0.76 - 1.27 mg/dL   GFR calc non Af Amer 95 >59 mL/min/1.73   GFR calc Af Amer 110 >59 mL/min/1.73   BUN/Creatinine Ratio 22 (H) 9 - 20   Sodium 138 134 - 144 mmol/L   Potassium 4.5 3.5 - 5.2 mmol/L   Chloride 99 96 - 106 mmol/L   CO2 26 20 - 29 mmol/L   Calcium 9.2 8.7 - 10.2 mg/dL   Total Protein 6.8 6.0 - 8.5 g/dL   Albumin 4.1 4.0 - 5.0 g/dL   Globulin, Total 2.7 1.5 - 4.5 g/dL   Albumin/Globulin Ratio 1.5 1.2 - 2.2   Bilirubin Total 0.3 0.0 - 1.2 mg/dL   Alkaline Phosphatase 162 (H) 44 - 121 IU/L   AST 32 0 - 40 IU/L   ALT 38 0 - 44 IU/L  Bayer DCA Hb A1c Waived  Result Value Ref Range   HB A1C (BAYER DCA - WAIVED) 7.3 (H) <7.0 %  Lipid Panel w/o Chol/HDL Ratio  Result Value Ref Range   Cholesterol, Total 174 100 - 199 mg/dL   Triglycerides 332 (H) 0 - 149 mg/dL   HDL 55 >95 mg/dL   VLDL Cholesterol Cal 54 (H) 5 - 40 mg/dL   LDL Chol Calc (NIH) 65 0 - 99 mg/dL      Assessment & Plan:   Problem List Items Addressed This Visit      Cardiovascular and Mediastinum   HTN (hypertension)    Under good control on current regimen. Continue current regimen. Continue to monitor. Call with any concerns. Refills given. Labs drawn today.       Relevant Medications   rosuvastatin (CRESTOR) 20 MG tablet     Endocrine   Diabetes mellitus type 2, diet-controlled (HCC) - Primary    Up slightly with A1c of 7.3. Will really work on diet and recheck 3 months. Call with any concerns. Continue to monitor.       Relevant Medications   rosuvastatin (CRESTOR) 20 MG tablet   Other Relevant Orders   Comprehensive metabolic panel (Completed)   Bayer DCA Hb A1c Waived (Completed)     Other   HLD (hyperlipidemia)    Under good control on current regimen. Continue current regimen. Continue to monitor. Call with any concerns. Refills given. Labs drawn today.      Relevant Medications   rosuvastatin (CRESTOR) 20  MG tablet   Other Relevant Orders   Comprehensive metabolic panel (Completed)   Lipid Panel  w/o Chol/HDL Ratio (Completed)    Other Visit Diagnoses    Essential hypertension       Relevant Medications   rosuvastatin (CRESTOR) 20 MG tablet   Other Relevant Orders   Comprehensive metabolic panel (Completed)       Follow up plan: Return in about 3 months (around 05/15/2020).

## 2020-02-16 LAB — LIPID PANEL W/O CHOL/HDL RATIO
Cholesterol, Total: 174 mg/dL (ref 100–199)
HDL: 55 mg/dL (ref >39)
LDL Chol Calc (NIH): 65 mg/dL (ref 0–99)
Triglycerides: 351 mg/dL — ABNORMAL HIGH (ref 0–149)
VLDL Cholesterol Cal: 54 mg/dL — ABNORMAL HIGH (ref 5–40)

## 2020-02-16 LAB — COMPREHENSIVE METABOLIC PANEL
ALT: 38 IU/L (ref 0–44)
AST: 32 IU/L (ref 0–40)
Albumin/Globulin Ratio: 1.5 (ref 1.2–2.2)
Albumin: 4.1 g/dL (ref 4.0–5.0)
Alkaline Phosphatase: 162 IU/L — ABNORMAL HIGH (ref 44–121)
BUN/Creatinine Ratio: 22 — ABNORMAL HIGH (ref 9–20)
BUN: 21 mg/dL (ref 6–24)
Bilirubin Total: 0.3 mg/dL (ref 0.0–1.2)
CO2: 26 mmol/L (ref 20–29)
Calcium: 9.2 mg/dL (ref 8.7–10.2)
Chloride: 99 mmol/L (ref 96–106)
Creatinine, Ser: 0.94 mg/dL (ref 0.76–1.27)
GFR calc Af Amer: 110 mL/min/{1.73_m2} (ref >59)
GFR calc non Af Amer: 95 mL/min/{1.73_m2} (ref >59)
Globulin, Total: 2.7 g/dL (ref 1.5–4.5)
Glucose: 245 mg/dL — ABNORMAL HIGH (ref 65–99)
Potassium: 4.5 mmol/L (ref 3.5–5.2)
Sodium: 138 mmol/L (ref 134–144)
Total Protein: 6.8 g/dL (ref 6.0–8.5)

## 2020-02-16 NOTE — Assessment & Plan Note (Signed)
Under good control on current regimen. Continue current regimen. Continue to monitor. Call with any concerns. Refills given. Labs drawn today.   

## 2020-02-16 NOTE — Assessment & Plan Note (Signed)
Up slightly with A1c of 7.3. Will really work on diet and recheck 3 months. Call with any concerns. Continue to monitor.

## 2020-02-19 ENCOUNTER — Telehealth: Payer: Self-pay

## 2020-02-19 NOTE — Telephone Encounter (Signed)
Dexilant is not covered, can an alternative be sent in?  Esomeprazole magnesium Dr caps Omeprazole  rabeprazole  Lansoprazole  pantroprazole

## 2020-02-22 DIAGNOSIS — M7711 Lateral epicondylitis, right elbow: Secondary | ICD-10-CM | POA: Diagnosis not present

## 2020-02-28 DIAGNOSIS — M7711 Lateral epicondylitis, right elbow: Secondary | ICD-10-CM | POA: Diagnosis not present

## 2020-02-28 NOTE — Telephone Encounter (Signed)
Can this be changed °

## 2020-03-04 NOTE — Telephone Encounter (Signed)
He had a bunch at home and doesn't need it right now. He has tried other meds without benefit- he was thinking about paying out of pocket

## 2020-03-04 NOTE — Telephone Encounter (Signed)
Noted  

## 2020-03-04 NOTE — Telephone Encounter (Signed)
Can an alternative be sent in?

## 2020-03-06 DIAGNOSIS — M7711 Lateral epicondylitis, right elbow: Secondary | ICD-10-CM | POA: Diagnosis not present

## 2020-03-11 DIAGNOSIS — M7711 Lateral epicondylitis, right elbow: Secondary | ICD-10-CM | POA: Diagnosis not present

## 2020-03-26 DIAGNOSIS — M7711 Lateral epicondylitis, right elbow: Secondary | ICD-10-CM | POA: Diagnosis not present

## 2020-04-03 DIAGNOSIS — M7711 Lateral epicondylitis, right elbow: Secondary | ICD-10-CM | POA: Diagnosis not present

## 2020-04-09 DIAGNOSIS — M7711 Lateral epicondylitis, right elbow: Secondary | ICD-10-CM | POA: Diagnosis not present

## 2020-05-17 ENCOUNTER — Other Ambulatory Visit: Payer: Self-pay

## 2020-05-17 ENCOUNTER — Encounter: Payer: Self-pay | Admitting: Family Medicine

## 2020-05-17 ENCOUNTER — Ambulatory Visit: Payer: BC Managed Care – PPO | Admitting: Family Medicine

## 2020-05-17 VITALS — BP 146/94 | HR 103 | Temp 98.2°F | Wt 199.2 lb

## 2020-05-17 DIAGNOSIS — E119 Type 2 diabetes mellitus without complications: Secondary | ICD-10-CM

## 2020-05-17 DIAGNOSIS — R42 Dizziness and giddiness: Secondary | ICD-10-CM

## 2020-05-17 LAB — BAYER DCA HB A1C WAIVED: HB A1C (BAYER DCA - WAIVED): 7 % — ABNORMAL HIGH (ref <7.0)

## 2020-05-17 NOTE — Progress Notes (Signed)
BP (!) 146/94   Pulse (!) 103   Temp 98.2 F (36.8 C)   Wt 199 lb 3.2 oz (90.4 kg)   SpO2 97%   BMI 28.58 kg/m    Subjective:    Patient ID: Stephen Mayo, male    DOB: 1970/11/26, 50 y.o.   MRN: [id number]  HPI: Stephen Mayo is a 50 y.o. male  Chief Complaint  Patient presents with  . Diabetes  . Dizziness    Patient states he has been doing exercises at home for vertigo but has not had much relief.    DIABETES Hypoglycemic episodes:no Polydipsia/polyuria: no Visual disturbance: no Chest pain: no Paresthesias: no Glucose Monitoring: no  Accucheck frequency: Not Checking  Fasting glucose:  Post prandial:  Evening:  Before meals: Taking Insulin?: no  Long acting insulin:  Short acting insulin: Blood Pressure Monitoring: not checking Retinal Examination: Up to Date Foot Exam: Not up to Date Diabetic Education: Completed Pneumovax: Up to Date Influenza: Up to Date Aspirin: no   DIZZINESS- has had veritgo, epley's not helping Duration: chronic Description of symptoms: room spinning Duration of episode: minutes Dizziness frequency: recurrent Provoking factors: unknown Aggravating factors:  none Triggered by rolling over in bed: yes Triggered by bending over: no Aggravated by head movement: yes Aggravated by exertion, coughing, loud noises: no Recent head injury: no Recent or current viral symptoms: no History of vasovagal episodes: no Nausea: no Vomiting: no Tinnitus: no Hearing loss: no Aural fullness: no Headache: no Photophobia/phonophobia: no Unsteady gait: no Postural instability: no Diplopia, dysarthria, dysphagia or weakness: no Related to exertion: no Pallor: no Diaphoresis: no Dyspnea: no Chest pain: no  Relevant past medical, surgical, family and social history reviewed and updated as indicated. Interim medical history since our last visit reviewed. Allergies and medications reviewed and updated.  Review of Systems   Constitutional: Negative.   Respiratory: Negative.   Cardiovascular: Negative.   Gastrointestinal: Negative.   Musculoskeletal: Negative.   Neurological: Positive for dizziness. Negative for tremors, seizures, syncope, facial asymmetry, speech difficulty, weakness, light-headedness, numbness and headaches.  Psychiatric/Behavioral: Negative.     Per HPI unless specifically indicated above     Objective:    BP (!) 146/94   Pulse (!) 103   Temp 98.2 F (36.8 C)   Wt 199 lb 3.2 oz (90.4 kg)   SpO2 97%   BMI 28.58 kg/m   Wt Readings from Last 3 Encounters:  05/17/20 199 lb 3.2 oz (90.4 kg)  02/15/20 216 lb (98 kg)  02/01/20 194 lb (88 kg)    Physical Exam Vitals and nursing note reviewed.  Constitutional:      General: He is not in acute distress.    Appearance: Normal appearance. He is not ill-appearing, toxic-appearing or diaphoretic.  HENT:     Head: Normocephalic and atraumatic.     Right Ear: External ear normal.     Left Ear: External ear normal.     Nose: Nose normal.     Mouth/Throat:     Mouth: Mucous membranes are moist.     Pharynx: Oropharynx is clear.  Eyes:     General: No scleral icterus.       Right eye: No discharge.        Left eye: No discharge.     Extraocular Movements: Extraocular movements intact.     Conjunctiva/sclera: Conjunctivae normal.     Pupils: Pupils are equal, round, and reactive to light.  Cardiovascular:  Rate and Rhythm: Normal rate and regular rhythm.     Pulses: Normal pulses.     Heart sounds: Normal heart sounds. No murmur heard. No friction rub. No gallop.   Pulmonary:     Effort: Pulmonary effort is normal. No respiratory distress.     Breath sounds: Normal breath sounds. No stridor. No wheezing, rhonchi or rales.  Chest:     Chest wall: No tenderness.  Musculoskeletal:        General: Normal range of motion.     Cervical back: Normal range of motion and neck supple.  Skin:    General: Skin is warm and dry.      Capillary Refill: Capillary refill takes less than 2 seconds.     Coloration: Skin is not jaundiced or pale.     Findings: No bruising, erythema, lesion or rash.  Neurological:     General: No focal deficit present.     Mental Status: He is alert and oriented to person, place, and time. Mental status is at baseline.  Psychiatric:        Mood and Affect: Mood normal.        Behavior: Behavior normal.        Thought Content: Thought content normal.        Judgment: Judgment normal.     Results for orders placed or performed in visit on 02/15/20  Comprehensive metabolic panel  Result Value Ref Range   Glucose 245 (H) 65 - 99 mg/dL   BUN 21 6 - 24 mg/dL   Creatinine, Ser 7.56 0.76 - 1.27 mg/dL   GFR calc non Af Amer 95 >59 mL/min/1.73   GFR calc Af Amer 110 >59 mL/min/1.73   BUN/Creatinine Ratio 22 (H) 9 - 20   Sodium 138 134 - 144 mmol/L   Potassium 4.5 3.5 - 5.2 mmol/L   Chloride 99 96 - 106 mmol/L   CO2 26 20 - 29 mmol/L   Calcium 9.2 8.7 - 10.2 mg/dL   Total Protein 6.8 6.0 - 8.5 g/dL   Albumin 4.1 4.0 - 5.0 g/dL   Globulin, Total 2.7 1.5 - 4.5 g/dL   Albumin/Globulin Ratio 1.5 1.2 - 2.2   Bilirubin Total 0.3 0.0 - 1.2 mg/dL   Alkaline Phosphatase 162 (H) 44 - 121 IU/L   AST 32 0 - 40 IU/L   ALT 38 0 - 44 IU/L  Bayer DCA Hb A1c Waived  Result Value Ref Range   HB A1C (BAYER DCA - WAIVED) 7.3 (H) <7.0 %  Lipid Panel w/o Chol/HDL Ratio  Result Value Ref Range   Cholesterol, Total 174 100 - 199 mg/dL   Triglycerides 433 (H) 0 - 149 mg/dL   HDL 55 >29 mg/dL   VLDL Cholesterol Cal 54 (H) 5 - 40 mg/dL   LDL Chol Calc (NIH) 65 0 - 99 mg/dL      Assessment & Plan:   Problem List Items Addressed This Visit      Endocrine   Diabetes mellitus type 2, diet-controlled (HCC) - Primary    Improved with A1c of 7.0- congratulated patient on his weight loss. Continue diet and exercise. Recheck 3 months at physical. Call with any concerns.       Relevant Orders   Bayer DCA Hb  A1c Waived    Other Visit Diagnoses    Vertigo       Not getting better. Will get him into ENT and PT. Call with any concerns.    Relevant  Orders   Ambulatory referral to Physical Therapy   Ambulatory referral to ENT       Follow up plan: Return in about 3 months (around 08/16/2020) for physical.

## 2020-05-17 NOTE — Assessment & Plan Note (Signed)
Improved with A1c of 7.0- congratulated patient on his weight loss. Continue diet and exercise. Recheck 3 months at physical. Call with any concerns.

## 2020-05-24 DIAGNOSIS — R42 Dizziness and giddiness: Secondary | ICD-10-CM | POA: Diagnosis not present

## 2020-05-31 DIAGNOSIS — R42 Dizziness and giddiness: Secondary | ICD-10-CM | POA: Diagnosis not present

## 2020-06-07 DIAGNOSIS — J301 Allergic rhinitis due to pollen: Secondary | ICD-10-CM | POA: Diagnosis not present

## 2020-06-07 DIAGNOSIS — R42 Dizziness and giddiness: Secondary | ICD-10-CM | POA: Diagnosis not present

## 2020-06-07 DIAGNOSIS — H6983 Other specified disorders of Eustachian tube, bilateral: Secondary | ICD-10-CM | POA: Diagnosis not present

## 2020-06-07 DIAGNOSIS — H93291 Other abnormal auditory perceptions, right ear: Secondary | ICD-10-CM | POA: Diagnosis not present

## 2020-07-28 ENCOUNTER — Telehealth: Payer: BC Managed Care – PPO | Admitting: Family Medicine

## 2020-07-28 ENCOUNTER — Telehealth: Payer: BC Managed Care – PPO

## 2020-07-28 DIAGNOSIS — U071 COVID-19: Secondary | ICD-10-CM | POA: Diagnosis not present

## 2020-07-28 DIAGNOSIS — Z7189 Other specified counseling: Secondary | ICD-10-CM | POA: Diagnosis not present

## 2020-07-28 NOTE — Patient Instructions (Addendum)
I hope you feel better Please complete the full course of antiviral. Please consider the OTC measures we discussed. Seek emergent assist if you develop shortness of breath and chest pain.  Molnupiravir Oral Capsules What is this medication? MOLNUPIRAVIR (mol nue pir a vir) treats COVID-19. It is an antiviral medication. It may decrease the risk of developing severe symptoms of COVID-19. It may also decrease the chance of going to the hospital. This medication is not approved by the FDA. The FDA has authorized emergency use of thismedication during the COVID-19 pandemic. This medicine may be used for other purposes; ask your health care provider orpharmacist if you have questions. What should I tell my care team before I take this medication? They need to know if you have any of these conditions: Any allergies Any serious illness An unusual or allergic reaction to molnupiravir, other medications, foods, dyes, or preservatives Pregnant or trying to get pregnant Breast-feeding How should I use this medication? Take this medication by mouth with water. Take it as directed on the prescription label at the same time every day. Do not cut, crush or chew this medication. Swallow the capsules whole. You can take it with or without food. If it upsets your stomach, take it with food. Take all of this medication unless your care team tells you to stop it early. Keep taking it even if youthink you are better. Talk to your care team about the use of this medication in children. Specialcare may be needed. Overdosage: If you think you have taken too much of this medicine contact apoison control center or emergency room at once. NOTE: This medicine is only for you. Do not share this medicine with others. What if I miss a dose? If you miss a dose, take it as soon as you can unless it is more than 10 hours late. If it is more than 10 hours late, skip the missed dose. Take the next dose at the normal time. Do not  take extra or 2 doses at the same time to makeup for the missed dose. What may interact with this medication? Interactions have not been studied. This list may not describe all possible interactions. Give your health care provider a list of all the medicines, herbs, non-prescription drugs, or dietary supplements you use. Also tell them if you smoke, drink alcohol, or use illegaldrugs. Some items may interact with your medicine. What should I watch for while using this medication? Your condition will be monitored carefully while you are receiving this medication. Visit your care team for regular checkups. Tell your care team ifyour symptoms do not start to get better or if they get worse. Do not become pregnant while taking this medication. You may need a pregnancy test before starting this medication. Women must use a reliable form of birth control while taking this medication and for 4 days after stopping the medication. Women should inform their care team if they wish to become pregnant or think they might be pregnant. Men should not father a child while taking this medication and for 3 months after stopping it. There is potential for serious harm to an unborn child. Talk to your care team for more information. Do not breast-feed an infant while taking this medication and for 4 days afterstopping the medication. What side effects may I notice from receiving this medication? Side effects that you should report to your care team as soon as possible: Allergic reactions-skin rash, itching, hives, swelling of the face, lips, tongue,  or throat Side effects that usually do not require medical attention (report these toyour care team if they continue or are bothersome): Diarrhea Dizziness Nausea This list may not describe all possible side effects. Call your doctor for medical advice about side effects. You may report side effects to FDA at1-800-FDA-1088. Where should I keep my medication? Keep out of the  reach of children and pets. Store at room temperature between 20 and 25 degrees C (68 and 77 degrees F).Get rid of any unused medication after the expiration date. To get rid of medications that are no longer needed or have expired: Take the medication to a medication take-back program. Check with your pharmacy or law enforcement to find a location. If you cannot return the medication, check the label or package insert to see if the medication should be thrown out in the garbage or flushed down the toilet. If you are not sure, ask your care team. If it is safe to put it in the trash, take the medication out of the container. Mix the medication with cat litter, dirt, coffee grounds, or other unwanted substance. Seal the mixture in a bag or container. Put it in the trash. NOTE: This sheet is a summary. It may not cover all possible information. If you have questions about this medicine, talk to your doctor, pharmacist, orhealth care provider.  2022 Elsevier/Gold Standard (2020-01-22 16:16:01)

## 2020-07-28 NOTE — Progress Notes (Signed)
Mr. Stephen Mayo are scheduled for a virtual visit with your provider today.    Just as we do with appointments in the office, we must obtain your consent to participate.  Your consent will be active for this visit and any virtual visit you may have with one of our providers in the next 365 days.    If you have a MyChart account, I can also send a copy of this consent to you electronically.  All virtual visits are billed to your insurance company just like a traditional visit in the office.  As this is a virtual visit, video technology does not allow for your provider to perform a traditional examination.  This may limit your provider's ability to fully assess your condition.  If your provider identifies any concerns that need to be evaluated in person or the need to arrange testing such as labs, EKG, etc, we will make arrangements to do so.    Although advances in technology are sophisticated, we cannot ensure that it will always work on either your end or our end.  If the connection with a video visit is poor, we may have to switch to a telephone visit.  With either a video or telephone visit, we are not always able to ensure that we have a secure connection.   I need to obtain your verbal consent now.   Are you willing to proceed with your visit today?   Stephen Mayo has provided verbal consent on 07/28/2020 for a virtual visit (video or telephone).   Stephen Finner, NP 07/28/2020  1:28 PM   Date:  07/28/2020   ID:  Stephen Mayo, DOB [birth year], MRN [id number]  Patient Location: Home Provider Location: Home Office   Participants: Patient and Provider for Visit and Wrap up  Method of visit: Video  Location of Patient: Home Location of Provider: Home Office Consent was obtain for visit over the video. Services rendered by provider: Visit was performed via video  A video enabled telemedicine application was used and I verified that I am speaking with the correct person using two  identifiers.  PCP:  Dorcas Carrow, DO   Chief Complaint:  covid + HT  History of Present Illness:    Stephen Mayo is a 50 y.o. male with history as stated below. Presents video telehealth for an acute care visit Covid + HT  Onset of symptoms was Tuesday 6/28 worsen over two days and then Thursday did test and was + and symptoms have been persistent and include: nasal congestion, sore throat and cough. Thursday developed cold chills no fever noted.  Wife was sick but went to drs and was neg for COvid.   Denies having fevers, shortness of breath, chest pain, ear pain, or exposure to covid. Modifying factors include: some OTC pain relievers. No other aggravating or relieving factors.  No other c/o.  The patient does have symptoms concerning for COVID-19 infection (fever, chills, cough, or new shortness of breath).   Past Medical, Surgical, Social History, Allergies, and Medications have been Reviewed.  Past Medical History:  Diagnosis Date   Allergy    Back pain    Diabetes mellitus without complication (HCC)    GERD (gastroesophageal reflux disease)    Hyperlipidemia    Hypertension     No outpatient medications have been marked as taking for the 07/28/20 encounter (Video Visit) with [facility] PROVIDER.     Allergies:   Lisinopril   ROS See HPI for history  of present illness.  Physical Exam Constitutional:      Appearance: Normal appearance.  HENT:     Head: Normocephalic.     Nose: Nose normal.  Eyes:     Conjunctiva/sclera: Conjunctivae normal.  Pulmonary:     Effort: Pulmonary effort is normal.     Comments: No shortness of breath in conversation, breathing effort on video is normal Skin:    General: Skin is warm.  Neurological:     General: No focal deficit present.     Mental Status: He is alert.  Psychiatric:        Mood and Affect: Mood normal.        Thought Content: Thought content normal.        Judgment: Judgment normal.               A&P  1. COVID-19 + HT w/ smyptoms -antiviral today -encouraged vit c, d, and zinc for support and vit b complex if needed -plenty of fluids and rest -OTC measures for cough and congestion reviewed - Patient acknowledged agreement and understanding of the plan.    2. Educated about COVID-19 virus infection Education provided on isolation and antiviral usage    Time:   Today, I have spent 10 minutes with the patient with telehealth technology discussing the above problems, reviewing the chart, previous notes, medications and orders.   Medication Changes: No orders of the defined types were placed in this encounter.    Disposition:  Follow up PRN  Signed, Stephen Finner, NP  07/28/2020 1:28 PM

## 2020-08-05 ENCOUNTER — Telehealth: Payer: BC Managed Care – PPO | Admitting: Gastroenterology

## 2020-08-05 DIAGNOSIS — K219 Gastro-esophageal reflux disease without esophagitis: Secondary | ICD-10-CM

## 2020-08-05 DIAGNOSIS — Z1211 Encounter for screening for malignant neoplasm of colon: Secondary | ICD-10-CM

## 2020-08-05 MED ORDER — PEG 3350-KCL-NA BICARB-NACL 420 G PO SOLR: 4000.0000 mL | Freq: Once | ORAL | 0 refills | Status: AC

## 2020-08-05 NOTE — Progress Notes (Signed)
Gastroenterology Pre-Procedure Review  Request Date: 08/21/20 Requesting Physician: Dr. Maximino Greenland  PATIENT REVIEW QUESTIONS: The patient responded to the following health history questions as indicated:    1. Are you having any GI issues? yes (IBS) 2. Do you have a personal history of Polyps? yes (2017) 3. Do you have a family history of Colon Cancer or Polyps? yes (father) 4. Diabetes Mellitus? no 5. Joint replacements in the past 12 months?no 6. Major health problems in the past 3 months?no 7. Any artificial heart valves, MVP, or defibrillator?no    MEDICATIONS & ALLERGIES:    Patient reports the following regarding taking any anticoagulation/antiplatelet therapy:   Plavix, Coumadin, Eliquis, Xarelto, Lovenox, Pradaxa, Brilinta, or Effient? no Aspirin? no  Patient confirms/reports the following medications:  Current Outpatient Medications  Medication Sig Dispense Refill   polyethylene glycol-electrolytes (NULYTELY) 420 g solution Take 4,000 mLs by mouth once for 1 dose. 4000 mL 0   azelastine (ASTELIN) 0.1 % nasal spray Place 1 spray into both nostrils 2 (two) times daily.     cyclobenzaprine (FLEXERIL) 10 MG tablet Take 1 tablet (10 mg total) by mouth at bedtime. 30 tablet 0   dexlansoprazole (DEXILANT) 60 MG capsule Take 1 capsule (60 mg total) by mouth daily. 90 capsule 1   glucose blood (ONE TOUCH ULTRA TEST) test strip USE TO TEST DAILY 100 each 4   losartan (COZAAR) 50 MG tablet TAKE 1 TABLET DAILY 90 tablet 3   meloxicam (MOBIC) 15 MG tablet Take 15 mg by mouth daily.     naproxen (NAPROSYN) 500 MG tablet Take 1 tablet (500 mg total) by mouth 2 (two) times daily with a meal. 60 tablet 3   ONETOUCH DELICA LANCETS FINE MISC USE TO TEST DAILY 100 each 4   rosuvastatin (CRESTOR) 20 MG tablet Take 1 tablet (20 mg total) by mouth daily. 90 tablet 1   No current facility-administered medications for this visit.    Patient confirms/reports the following allergies:  Allergies   Allergen Reactions   Lisinopril Cough    Orders Placed This Encounter  Procedures   Procedural/ Surgical Case Request: COLONOSCOPY WITH PROPOFOL, ESOPHAGOGASTRODUODENOSCOPY (EGD) WITH PROPOFOL    Standing Status:   Standing    Number of Occurrences:   1    Order Specific Question:   Pre-op diagnosis    Answer:   Colon cancer screening Z12.11; Gastroesophageal reflux disease without esophagitis K21.9    Order Specific Question:   CPT Code    Answer:   20254    AUTHORIZATION INFORMATION Primary Insurance: 1D#: Group #:  Secondary Insurance: 1D#: Group #:  SCHEDULE INFORMATION: Date: 08/21/20  Time: Location: [facility]

## 2020-08-13 ENCOUNTER — Telehealth: Payer: Self-pay | Admitting: Family Medicine

## 2020-08-13 DIAGNOSIS — E782 Mixed hyperlipidemia: Secondary | ICD-10-CM

## 2020-08-13 DIAGNOSIS — E119 Type 2 diabetes mellitus without complications: Secondary | ICD-10-CM

## 2020-08-13 DIAGNOSIS — R12 Heartburn: Secondary | ICD-10-CM

## 2020-08-13 DIAGNOSIS — Z0000 Encounter for general adult medical examination without abnormal findings: Secondary | ICD-10-CM

## 2020-08-13 DIAGNOSIS — I10 Essential (primary) hypertension: Secondary | ICD-10-CM

## 2020-08-13 NOTE — Telephone Encounter (Signed)
Orders from Friday AM in.

## 2020-08-13 NOTE — Telephone Encounter (Signed)
-----   Message from [place], [place] sent at 08/13/2020 11:28 AM EDT ----- He would like to come in Friday morning for labs.   ----- Message ----- From: Dorcas Carrow, DO Sent: 08/13/2020  11:10 AM EDT To: [facility]  Please let Brynda Greathouse know that our lab is going to be closed when he comes in for his physical on Friday. He can do 3 things:  1. Come in in the AM to get his blood work done- let me know and I'll put in his labs 2. Come back on Monday to get his labs done after his appointment 3. Reschedule his appointment.   Let me know what he wants to do

## 2020-08-16 ENCOUNTER — Encounter: Payer: Self-pay | Admitting: Family Medicine

## 2020-08-16 ENCOUNTER — Ambulatory Visit (INDEPENDENT_AMBULATORY_CARE_PROVIDER_SITE_OTHER): Payer: BC Managed Care – PPO | Admitting: Family Medicine

## 2020-08-16 ENCOUNTER — Other Ambulatory Visit: Payer: BC Managed Care – PPO

## 2020-08-16 ENCOUNTER — Other Ambulatory Visit: Payer: Self-pay

## 2020-08-16 VITALS — BP 147/90 | HR 79 | Temp 99.2°F | Ht 70.63 in | Wt 193.2 lb

## 2020-08-16 DIAGNOSIS — I10 Essential (primary) hypertension: Secondary | ICD-10-CM | POA: Diagnosis not present

## 2020-08-16 DIAGNOSIS — Z0000 Encounter for general adult medical examination without abnormal findings: Secondary | ICD-10-CM

## 2020-08-16 DIAGNOSIS — K2100 Gastro-esophageal reflux disease with esophagitis, without bleeding: Secondary | ICD-10-CM | POA: Diagnosis not present

## 2020-08-16 DIAGNOSIS — E782 Mixed hyperlipidemia: Secondary | ICD-10-CM | POA: Diagnosis not present

## 2020-08-16 DIAGNOSIS — E119 Type 2 diabetes mellitus without complications: Secondary | ICD-10-CM

## 2020-08-16 DIAGNOSIS — R12 Heartburn: Secondary | ICD-10-CM

## 2020-08-16 LAB — MICROALBUMIN, URINE WAIVED
Creatinine, Urine Waived: 200 mg/dL (ref 10–300)
Microalb, Ur Waived: 10 mg/L (ref 0–19)
Microalb/Creat Ratio: <30 mg/g (ref <30)

## 2020-08-16 LAB — URINALYSIS, ROUTINE W REFLEX MICROSCOPIC
Bilirubin, UA: NEGATIVE
Glucose, UA: NEGATIVE
Ketones, UA: NEGATIVE
Leukocytes,UA: NEGATIVE
Nitrite, UA: NEGATIVE
Protein,UA: NEGATIVE
RBC, UA: NEGATIVE
Specific Gravity, UA: 1.025 (ref 1.005–1.030)
Urobilinogen, Ur: 1 mg/dL (ref 0.2–1.0)
pH, UA: 5.5 (ref 5.0–7.5)

## 2020-08-16 LAB — BAYER DCA HB A1C WAIVED: HB A1C (BAYER DCA - WAIVED): 5.8 % (ref <7.0)

## 2020-08-16 MED ORDER — LOSARTAN POTASSIUM 50 MG PO TABS: 50.0000 mg | ORAL_TABLET | Freq: Every day | ORAL | 1 refills | Status: DC

## 2020-08-16 MED ORDER — NAPROXEN 500 MG PO TABS: 500.0000 mg | ORAL_TABLET | Freq: Two times a day (BID) | ORAL | 3 refills | Status: DC

## 2020-08-16 MED ORDER — ROSUVASTATIN CALCIUM 20 MG PO TABS: 20.0000 mg | ORAL_TABLET | Freq: Every day | ORAL | 1 refills | Status: DC

## 2020-08-16 MED ORDER — DEXLANSOPRAZOLE 60 MG PO CPDR: 60.0000 mg | DELAYED_RELEASE_CAPSULE | Freq: Every day | ORAL | 1 refills | Status: DC

## 2020-08-16 NOTE — Assessment & Plan Note (Signed)
Under good control on current regimen. Continue current regimen. Continue to monitor. Call with any concerns. Refills given. Labs drawn today.   

## 2020-08-16 NOTE — Assessment & Plan Note (Signed)
Doing great with A1c of 5.8. Continue diet and exercise. Continue to monitor. Recheck 6 months.

## 2020-08-16 NOTE — Progress Notes (Signed)
BP (!) 147/90   Pulse 79   Temp 99.2 F (37.3 C)   Ht 5' 10.63" (1.794 m)   Wt 193 lb 4 oz (87.7 kg)   SpO2 97%   BMI 27.24 kg/m    Subjective:    Patient ID: Stephen Mayo, male    DOB: 1970/03/09, 50 y.o.   MRN: [id number]  HPI: Stephen Mayo is a 50 y.o. male presenting on 08/16/2020 for comprehensive medical examination. Current medical complaints include:  HYPERTENSION / HYPERLIPIDEMIA Satisfied with current treatment? yes Duration of hypertension: chronic BP monitoring frequency: not checking BP medication side effects: no Past BP meds: losartan Duration of hyperlipidemia: chronic Cholesterol medication side effects: no Cholesterol supplements: none Past cholesterol medications: crestor Medication compliance: good compliance Aspirin: no Recent stressors: no Recurrent headaches: no Visual changes: no Palpitations: no Dyspnea: no Chest pain: no Lower extremity edema: no Dizzy/lightheaded: no  DIABETES Hypoglycemic episodes:no Polydipsia/polyuria: no Visual disturbance: no Chest pain: no Paresthesias: no Glucose Monitoring: no  Accucheck frequency: Not Checking Taking Insulin?: no Blood Pressure Monitoring: not checking Retinal Examination: Up to Date Foot Exam:  done today Diabetic Education: Completed Pneumovax: Up to Date Influenza: Up to Date  Interim Problems from his last visit: no  Depression Screen done today and results listed below:  Depression screen [facility] 2/9 08/16/2020 08/14/2019 08/11/2018 02/10/2018 12/15/2016  Decreased Interest 0 0 0 0 0  Down, Depressed, Hopeless 0 0 0 0 0  PHQ - 2 Score 0 0 0 0 0  Altered sleeping - 1 0 0 -  Tired, decreased energy - 0 0 0 -  Change in appetite - 0 0 0 -  Feeling bad or failure about yourself  - 0 0 0 -  Trouble concentrating - 0 0 0 -  Moving slowly or fidgety/restless - 0 0 0 -  Suicidal thoughts - 0 0 0 -  PHQ-9 Score - 1 0 0 -  Difficult doing work/chores - Not difficult at all Not  difficult at all Not difficult at all -   Past Medical History:  Past Medical History:  Diagnosis Date   Allergy    Arthritis    Back pain    Diabetes mellitus without complication (HCC)    GERD (gastroesophageal reflux disease)    Hyperlipidemia    Hypertension     Surgical History:  Past Surgical History:  Procedure Laterality Date   COLONOSCOPY WITH PROPOFOL N/A 10/25/2015   Procedure: COLONOSCOPY WITH PROPOFOL;  Surgeon: Scot Jun, MD;  Location: [facility] ENDOSCOPY;  Service: Endoscopy;  Laterality: N/A;   ESOPHAGOGASTRODUODENOSCOPY (EGD) WITH PROPOFOL N/A 11/02/2019   Procedure: ESOPHAGOGASTRODUODENOSCOPY (EGD) WITH PROPOFOL;  Surgeon: Pasty Spillers, MD;  Location: [facility] ENDOSCOPY;  Service: Endoscopy;  Laterality: N/A;   FRACTURE SURGERY     HAND SURGERY Right 01/26/2010   fracture- 5 pins and reconstruction   HERNIA REPAIR  01/27/2000   Double   NASAL SINUS SURGERY  01/26/2006   Maxillary Sinus Removed   STERIOD INJECTION     ulcer on his larynx      Medications:  Current Outpatient Medications on File Prior to Visit  Medication Sig   azelastine (ASTELIN) 0.1 % nasal spray Place 1 spray into both nostrils 2 (two) times daily.   cyclobenzaprine (FLEXERIL) 10 MG tablet Take 1 tablet (10 mg total) by mouth at bedtime.   fluticasone (FLONASE) 50 MCG/ACT nasal spray Place 2 sprays into both nostrils daily.   glucose blood (ONE TOUCH  ULTRA TEST) test strip USE TO TEST DAILY   meloxicam (MOBIC) 15 MG tablet Take 15 mg by mouth daily.   ONETOUCH DELICA LANCETS FINE MISC USE TO TEST DAILY   No current facility-administered medications on file prior to visit.    Allergies:  Allergies  Allergen Reactions   Lisinopril Cough    Social History:  Social History   Socioeconomic History   Marital status: Married    Spouse name: Not on file   Number of children: Not on file   Years of education: Not on file   Highest education level: Not on file  Occupational  History   Not on file  Tobacco Use   Smoking status: Former    Types: Cigarettes    Quit date: 06/25/2005    Years since quitting: 15.1   Smokeless tobacco: Former  Building services engineer Use: Never used  Substance and Sexual Activity   Alcohol use: Yes   Drug use: No   Sexual activity: Yes  Other Topics Concern   Not on file  Social History Narrative   Not on file   Social Determinants of Health   Financial Resource Strain: Not on file  Food Insecurity: Not on file  Transportation Needs: Not on file  Physical Activity: Not on file  Stress: Not on file  Social Connections: Not on file  Intimate Partner Violence: Not on file   Social History   Tobacco Use  Smoking Status Former   Types: Cigarettes   Quit date: 06/25/2005   Years since quitting: 15.1  Smokeless Tobacco Former   Social History   Substance and Sexual Activity  Alcohol Use Yes    Family History:  Family History  Problem Relation Age of Onset   Cancer Mother        Lung   Arthritis Mother    Cancer Father        colon   Cancer Maternal Grandmother        breast, brain and colon   Lymphoma Paternal Grandmother    Cancer Paternal Grandfather        lung and throat    Past medical history, surgical history, medications, allergies, family history and social history reviewed with patient today and changes made to appropriate areas of the chart.   Review of Systems  Constitutional: Negative.   HENT: Negative.    Eyes:  Positive for blurred vision. Negative for double vision, photophobia, pain, discharge and redness.  Respiratory: Negative.    Cardiovascular: Negative.   Gastrointestinal: Negative.   Genitourinary: Negative.   Musculoskeletal: Negative.   Skin: Negative.   Neurological: Negative.   Endo/Heme/Allergies: Negative.   Psychiatric/Behavioral: Negative.    All other ROS negative except what is listed above and in the HPI.      Objective:    BP (!) 147/90   Pulse 79   Temp 99.2  F (37.3 C)   Ht 5' 10.63" (1.794 m)   Wt 193 lb 4 oz (87.7 kg)   SpO2 97%   BMI 27.24 kg/m   Wt Readings from Last 3 Encounters:  08/16/20 193 lb 4 oz (87.7 kg)  05/17/20 199 lb 3.2 oz (90.4 kg)  02/15/20 216 lb (98 kg)    Physical Exam Vitals and nursing note reviewed.  Constitutional:      General: He is not in acute distress.    Appearance: Normal appearance. He is normal weight. He is not ill-appearing, toxic-appearing or diaphoretic.  HENT:  Head: Normocephalic and atraumatic.     Right Ear: Tympanic membrane, ear canal and external ear normal. There is no impacted cerumen.     Left Ear: Tympanic membrane, ear canal and external ear normal. There is no impacted cerumen.     Nose: Nose normal. No congestion or rhinorrhea.     Mouth/Throat:     Mouth: Mucous membranes are moist.     Pharynx: Oropharynx is clear. No oropharyngeal exudate or posterior oropharyngeal erythema.  Eyes:     General: No scleral icterus.       Right eye: No discharge.        Left eye: No discharge.     Extraocular Movements: Extraocular movements intact.     Conjunctiva/sclera: Conjunctivae normal.     Pupils: Pupils are equal, round, and reactive to light.  Neck:     Vascular: No carotid bruit.  Cardiovascular:     Rate and Rhythm: Normal rate and regular rhythm.     Pulses: Normal pulses.     Heart sounds: No murmur heard.   No friction rub. No gallop.  Pulmonary:     Effort: Pulmonary effort is normal. No respiratory distress.     Breath sounds: Normal breath sounds. No stridor. No wheezing, rhonchi or rales.  Chest:     Chest wall: No tenderness.  Abdominal:     General: Abdomen is flat. Bowel sounds are normal. There is no distension.     Palpations: Abdomen is soft. There is no mass.     Tenderness: There is no abdominal tenderness. There is no right CVA tenderness, left CVA tenderness, guarding or rebound.     Hernia: No hernia is present.  Genitourinary:    Comments: Genital  exam deferred with shared decision making Musculoskeletal:        General: No swelling, tenderness, deformity or signs of injury.     Cervical back: Normal range of motion and neck supple. No rigidity. No muscular tenderness.     Right lower leg: No edema.     Left lower leg: No edema.  Lymphadenopathy:     Cervical: No cervical adenopathy.  Skin:    General: Skin is warm and dry.     Capillary Refill: Capillary refill takes less than 2 seconds.     Coloration: Skin is not jaundiced or pale.     Findings: No bruising, erythema, lesion or rash.  Neurological:     General: No focal deficit present.     Mental Status: He is alert and oriented to person, place, and time.     Cranial Nerves: No cranial nerve deficit.     Sensory: No sensory deficit.     Motor: No weakness.     Coordination: Coordination normal.     Gait: Gait normal.     Deep Tendon Reflexes: Reflexes normal.  Psychiatric:        Mood and Affect: Mood normal.        Behavior: Behavior normal.        Thought Content: Thought content normal.        Judgment: Judgment normal.    Results for orders placed or performed in visit on 08/16/20  Microalbumin, Urine Waived  Result Value Ref Range   Microalb, Ur Waived 10 0 - 19 mg/L   Creatinine, Urine Waived 200 10 - 300 mg/dL   Microalb/Creat Ratio <30 <30 mg/g  Urinalysis, Routine w reflex microscopic  Result Value Ref Range   Specific Gravity, UA 1.025  1.005 - 1.030   pH, UA 5.5 5.0 - 7.5   Color, UA Yellow Yellow   Appearance Ur Clear Clear   Leukocytes,UA Negative Negative   Protein,UA Negative Negative/Trace   Glucose, UA Negative Negative   Ketones, UA Negative Negative   RBC, UA Negative Negative   Bilirubin, UA Negative Negative   Urobilinogen, Ur 1.0 0.2 - 1.0 mg/dL   Nitrite, UA Negative Negative  Bayer DCA Hb A1c Waived  Result Value Ref Range   HB A1C (BAYER DCA - WAIVED) 5.8 <7.0 %      Assessment & Plan:   Problem List Items Addressed This  Visit       Cardiovascular and Mediastinum   HTN (hypertension)    Under good control on current regimen. Continue current regimen. Continue to monitor. Call with any concerns. Refills given. Labs drawn today.        Relevant Medications   rosuvastatin (CRESTOR) 20 MG tablet   losartan (COZAAR) 50 MG tablet     Digestive   Gastroesophageal reflux disease with esophagitis    Under good control on current regimen. Continue current regimen. Continue to monitor. Call with any concerns. Refills given. Labs drawn today.         Endocrine   Diabetes mellitus type 2, diet-controlled (HCC)    Doing great with A1c of 5.8. Continue diet and exercise. Continue to monitor. Recheck 6 months.        Relevant Medications   rosuvastatin (CRESTOR) 20 MG tablet   losartan (COZAAR) 50 MG tablet     Other   HLD (hyperlipidemia)    Under good control on current regimen. Continue current regimen. Continue to monitor. Call with any concerns. Refills given. Labs drawn today.       Relevant Medications   rosuvastatin (CRESTOR) 20 MG tablet   losartan (COZAAR) 50 MG tablet   Other Visit Diagnoses     Routine general medical examination at a health care facility    -  Primary   Vaccines up to date. Screening labs checked today. Colonoscopy scheduled. Continue diet and exercise. Call with any concerns.         LABORATORY TESTING:  Health maintenance labs ordered today as discussed above.   The natural history of prostate cancer and ongoing controversy regarding screening and potential treatment outcomes of prostate cancer has been discussed with the patient. The meaning of a false positive PSA and a false negative PSA has been discussed. He indicates understanding of the limitations of this screening test and wishes  to proceed with screening PSA testing.   IMMUNIZATIONS:   - Tdap: Tetanus vaccination status reviewed: last tetanus booster within 10 years. - Influenza: Postponed to flu  season - Pneumovax: Up to date - Prevnar: Not applicable - HPV: Up to date - Zostavax vaccine: Not applicable  SCREENING: - Colonoscopy: scheduled Discussed with patient purpose of the colonoscopy is to detect colon cancer at curable precancerous or early stages    PATIENT COUNSELING:    Sexuality: Discussed sexually transmitted diseases, partner selection, use of condoms, avoidance of unintended pregnancy  and contraceptive alternatives.   Advised to avoid cigarette smoking.  I discussed with the patient that most people either abstain from alcohol or drink within safe limits (<=14/week and <=4 drinks/occasion for males, <=7/weeks and <= 3 drinks/occasion for females) and that the risk for alcohol disorders and other health effects rises proportionally with the number of drinks per week and how often a drinker exceeds  daily limits.  Discussed cessation/primary prevention of drug use and availability of treatment for abuse.   Diet: Encouraged to adjust caloric intake to maintain  or achieve ideal body weight, to reduce intake of dietary saturated fat and total fat, to limit sodium intake by avoiding high sodium foods and not adding table salt, and to maintain adequate dietary potassium and calcium preferably from fresh fruits, vegetables, and low-fat dairy products.    stressed the importance of regular exercise  Injury prevention: Discussed safety belts, safety helmets, smoke detector, smoking near bedding or upholstery.   Dental health: Discussed importance of regular tooth brushing, flossing, and dental visits.   Follow up plan: NEXT PREVENTATIVE PHYSICAL DUE IN 1 YEAR. Return in about 6 months (around 02/16/2021).

## 2020-08-17 LAB — COMPREHENSIVE METABOLIC PANEL
ALT: 92 IU/L — ABNORMAL HIGH (ref 0–44)
AST: 256 IU/L — ABNORMAL HIGH (ref 0–40)
Albumin/Globulin Ratio: 0.9 — ABNORMAL LOW (ref 1.2–2.2)
Albumin: 3 g/dL — ABNORMAL LOW (ref 4.0–5.0)
Alkaline Phosphatase: 356 IU/L — ABNORMAL HIGH (ref 44–121)
BUN/Creatinine Ratio: 11 (ref 9–20)
BUN: 9 mg/dL (ref 6–24)
Bilirubin Total: 0.9 mg/dL (ref 0.0–1.2)
CO2: 22 mmol/L (ref 20–29)
Calcium: 8.4 mg/dL — ABNORMAL LOW (ref 8.7–10.2)
Chloride: 96 mmol/L (ref 96–106)
Creatinine, Ser: 0.79 mg/dL (ref 0.76–1.27)
Globulin, Total: 3.4 g/dL (ref 1.5–4.5)
Glucose: 178 mg/dL — ABNORMAL HIGH (ref 65–99)
Potassium: 4 mmol/L (ref 3.5–5.2)
Sodium: 136 mmol/L (ref 134–144)
Total Protein: 6.4 g/dL (ref 6.0–8.5)
eGFR: 110 mL/min/{1.73_m2} (ref >59)

## 2020-08-17 LAB — LIPID PANEL W/O CHOL/HDL RATIO
Cholesterol, Total: 309 mg/dL — ABNORMAL HIGH (ref 100–199)
HDL: 11 mg/dL — ABNORMAL LOW (ref >39)
Triglycerides: 1935 mg/dL (ref 0–149)

## 2020-08-17 LAB — CBC WITH DIFFERENTIAL/PLATELET
Basophils Absolute: 0 10*3/uL (ref 0.0–0.2)
Basos: 1 %
EOS (ABSOLUTE): 0.1 10*3/uL (ref 0.0–0.4)
Eos: 3 %
Hematocrit: 41.5 % (ref 37.5–51.0)
Hemoglobin: 14.1 g/dL (ref 13.0–17.7)
Immature Grans (Abs): 0 10*3/uL (ref 0.0–0.1)
Immature Granulocytes: 0 %
Lymphocytes Absolute: 1.6 10*3/uL (ref 0.7–3.1)
Lymphs: 40 %
MCH: 32.5 pg (ref 26.6–33.0)
MCHC: 34 g/dL (ref 31.5–35.7)
MCV: 96 fL (ref 79–97)
Monocytes Absolute: 0.3 10*3/uL (ref 0.1–0.9)
Monocytes: 6 %
Neutrophils Absolute: 2 10*3/uL (ref 1.4–7.0)
Neutrophils: 50 %
Platelets: 187 10*3/uL (ref 150–450)
RBC: 4.34 x10E6/uL (ref 4.14–5.80)
RDW: 13.9 % (ref 11.6–15.4)
WBC: 4 10*3/uL (ref 3.4–10.8)

## 2020-08-17 LAB — PSA: Prostate Specific Ag, Serum: 0.1 ng/mL (ref 0.0–4.0)

## 2020-08-17 LAB — TSH: TSH: 1.33 u[IU]/mL (ref 0.450–4.500)

## 2020-08-19 ENCOUNTER — Telehealth: Payer: Self-pay

## 2020-08-19 ENCOUNTER — Other Ambulatory Visit: Payer: Self-pay | Admitting: Family Medicine

## 2020-08-19 DIAGNOSIS — E782 Mixed hyperlipidemia: Secondary | ICD-10-CM

## 2020-08-19 DIAGNOSIS — R748 Abnormal levels of other serum enzymes: Secondary | ICD-10-CM

## 2020-08-19 MED ORDER — ROSUVASTATIN CALCIUM 40 MG PO TABS: 40.0000 mg | ORAL_TABLET | Freq: Every day | ORAL | 1 refills | Status: DC

## 2020-08-19 NOTE — Telephone Encounter (Signed)
FYI

## 2020-08-19 NOTE — Telephone Encounter (Signed)
Copied from CRM [id number]. Topic: General - Other >> Aug 19, 2020  1:45 PM Jaquita Rector A wrote: Reason for CRM: Lelon Mast with [facility] called in to inform Dr Laural Benes that dexlansoprazole (DEXILANT) 60 MG capsule is not covered under patients insurance. There have been information faxed over to the office with alternative medication waiting on a response from Dr Henriette Combs office please. Can be reached at  Ph# [phone] Ref# [id number]

## 2020-08-20 ENCOUNTER — Telehealth: Payer: Self-pay | Admitting: Family Medicine

## 2020-08-20 ENCOUNTER — Encounter: Payer: Self-pay | Admitting: Gastroenterology

## 2020-08-20 NOTE — Telephone Encounter (Signed)
Alternatives provided due to dexlansoprazole (DEXILANT) 60 MG capsule  not being covered   Omeprazole DR capsules 20MG , 10MG  and 40MG   Pantoprazole sodium DR Tabs 20MG  and 40MG   Lansoprazole DR capsules 15MG  and 30MG    Rabeprazole sodium DR tabs 20MG    Esomeprazole Magnesium DR capsules 20MG  and 40MG     Ref# please contact pharmacy today

## 2020-08-21 ENCOUNTER — Ambulatory Visit: Payer: BC Managed Care – PPO | Admitting: Anesthesiology

## 2020-08-21 ENCOUNTER — Encounter
Admission: RE | Disposition: A | Discharge status: Home / Self Care | Payer: Self-pay | Source: Home / Self Care | Attending: Gastroenterology

## 2020-08-21 ENCOUNTER — Ambulatory Visit
Admission: RE | Admit: 2020-08-21 | Discharge: 2020-08-21 | Disposition: A | Discharge status: Home / Self Care | Payer: BC Managed Care – PPO | Attending: Gastroenterology | Admitting: Gastroenterology

## 2020-08-21 DIAGNOSIS — K31A0 Gastric intestinal metaplasia, unspecified: Secondary | ICD-10-CM | POA: Diagnosis not present

## 2020-08-21 DIAGNOSIS — K219 Gastro-esophageal reflux disease without esophagitis: Secondary | ICD-10-CM | POA: Diagnosis not present

## 2020-08-21 DIAGNOSIS — K5730 Diverticulosis of large intestine without perforation or abscess without bleeding: Secondary | ICD-10-CM | POA: Diagnosis not present

## 2020-08-21 DIAGNOSIS — Z888 Allergy status to other drugs, medicaments and biological substances status: Secondary | ICD-10-CM | POA: Diagnosis not present

## 2020-08-21 DIAGNOSIS — Z79899 Other long term (current) drug therapy: Secondary | ICD-10-CM | POA: Diagnosis not present

## 2020-08-21 DIAGNOSIS — Z1211 Encounter for screening for malignant neoplasm of colon: Secondary | ICD-10-CM | POA: Diagnosis not present

## 2020-08-21 DIAGNOSIS — R12 Heartburn: Secondary | ICD-10-CM | POA: Diagnosis not present

## 2020-08-21 DIAGNOSIS — K644 Residual hemorrhoidal skin tags: Secondary | ICD-10-CM | POA: Diagnosis not present

## 2020-08-21 DIAGNOSIS — K319 Disease of stomach and duodenum, unspecified: Secondary | ICD-10-CM

## 2020-08-21 DIAGNOSIS — K3189 Other diseases of stomach and duodenum: Secondary | ICD-10-CM | POA: Diagnosis not present

## 2020-08-21 DIAGNOSIS — K2950 Unspecified chronic gastritis without bleeding: Secondary | ICD-10-CM | POA: Diagnosis not present

## 2020-08-21 DIAGNOSIS — Z791 Long term (current) use of non-steroidal anti-inflammatories (NSAID): Secondary | ICD-10-CM | POA: Insufficient documentation

## 2020-08-21 DIAGNOSIS — K649 Unspecified hemorrhoids: Secondary | ICD-10-CM | POA: Diagnosis not present

## 2020-08-21 DIAGNOSIS — K648 Other hemorrhoids: Secondary | ICD-10-CM | POA: Diagnosis not present

## 2020-08-21 DIAGNOSIS — L539 Erythematous condition, unspecified: Secondary | ICD-10-CM | POA: Diagnosis not present

## 2020-08-21 HISTORY — PX: COLONOSCOPY WITH PROPOFOL: SHX5780

## 2020-08-21 HISTORY — PX: ESOPHAGOGASTRODUODENOSCOPY (EGD) WITH PROPOFOL: SHX5813

## 2020-08-21 LAB — GLUCOSE, CAPILLARY: Glucose-Capillary: 168 mg/dL — ABNORMAL HIGH (ref 70–99)

## 2020-08-21 SURGERY — COLONOSCOPY WITH PROPOFOL
Anesthesia: General

## 2020-08-21 MED ORDER — GLYCOPYRROLATE 0.2 MG/ML IJ SOLN
INTRAMUSCULAR | Status: AC
  Filled 2020-08-21: qty 1

## 2020-08-21 MED ORDER — SODIUM CHLORIDE 0.9 % IV SOLN
INTRAVENOUS | Status: DC
  Administered 2020-08-21: 20 mL/h via INTRAVENOUS

## 2020-08-21 MED ORDER — MIDAZOLAM HCL 2 MG/2ML IJ SOLN
INTRAMUSCULAR | Status: AC
  Filled 2020-08-21: qty 2

## 2020-08-21 MED ORDER — GLYCOPYRROLATE 0.2 MG/ML IJ SOLN
INTRAMUSCULAR | Status: DC | PRN
Start: 2020-08-21 — End: 2020-08-21
  Administered 2020-08-21: .2 mg via INTRAVENOUS

## 2020-08-21 MED ORDER — LIDOCAINE 2% (20 MG/ML) 5 ML SYRINGE
INTRAMUSCULAR | Status: DC | PRN
  Administered 2020-08-21: 25 mg via INTRAVENOUS

## 2020-08-21 MED ORDER — PROPOFOL 500 MG/50ML IV EMUL
INTRAVENOUS | Status: DC | PRN
  Administered 2020-08-21: 120 ug/kg/min via INTRAVENOUS

## 2020-08-21 MED ORDER — MIDAZOLAM HCL 5 MG/5ML IJ SOLN
INTRAMUSCULAR | Status: DC | PRN
  Administered 2020-08-21: 2 mg via INTRAVENOUS

## 2020-08-21 MED ORDER — PROPOFOL 10 MG/ML IV BOLUS
INTRAVENOUS | Status: DC | PRN
  Administered 2020-08-21: 100 mg via INTRAVENOUS

## 2020-08-21 NOTE — Anesthesia Postprocedure Evaluation (Signed)
Anesthesia Post Note  Patient: MIKIAH DEMOND  Procedure(s) Performed: COLONOSCOPY WITH PROPOFOL ESOPHAGOGASTRODUODENOSCOPY (EGD) WITH PROPOFOL  Patient location during evaluation: Endoscopy Anesthesia Type: General Level of consciousness: awake and alert Pain management: pain level controlled Vital Signs Assessment: post-procedure vital signs reviewed and stable Respiratory status: spontaneous breathing, nonlabored ventilation, respiratory function stable and patient connected to nasal cannula oxygen Cardiovascular status: blood pressure returned to baseline and stable Postop Assessment: no apparent nausea or vomiting Anesthetic complications: no   No notable events documented.   Last Vitals:  Vitals:   08/21/20 0904 08/21/20 1215  BP: (!) 140/99   Pulse: 81   Resp: 20   Temp: (!) 35.8 C 36.7 C  SpO2: 98%     Last Pain:  Vitals:   08/21/20 1215  TempSrc: Temporal  PainSc: Asleep                 Johny Blamer

## 2020-08-21 NOTE — H&P (Addendum)
Melodie Bouillon, MD [address], [place], [place], [address] [address], [place], [place], [address] Phone: [phone]  Fax: [phone]  Primary Care Physician:  Dorcas Carrow, DO   Pre-Procedure History & Physical: HPI:  Stephen Mayo is a 50 y.o. male is here for a colonoscopy and EGD.   Past Medical History:  Diagnosis Date   Allergy    Arthritis    Back pain    Diabetes mellitus without complication (HCC)    GERD (gastroesophageal reflux disease)    Hyperlipidemia    Hypertension     Past Surgical History:  Procedure Laterality Date   COLONOSCOPY WITH PROPOFOL N/A 10/25/2015   Procedure: COLONOSCOPY WITH PROPOFOL;  Surgeon: Scot Jun, MD;  Location: [facility] ENDOSCOPY;  Service: Endoscopy;  Laterality: N/A;   ESOPHAGOGASTRODUODENOSCOPY (EGD) WITH PROPOFOL N/A 11/02/2019   Procedure: ESOPHAGOGASTRODUODENOSCOPY (EGD) WITH PROPOFOL;  Surgeon: Pasty Spillers, MD;  Location: [facility] ENDOSCOPY;  Service: Endoscopy;  Laterality: N/A;   FRACTURE SURGERY     HAND SURGERY Right 01/26/2010   fracture- 5 pins and reconstruction   HERNIA REPAIR  01/27/2000   Double   NASAL SINUS SURGERY  01/26/2006   Maxillary Sinus Removed   STERIOD INJECTION     ulcer on his larynx      Prior to Admission medications   Medication Sig Start Date End Date Taking? Authorizing Provider  azelastine (ASTELIN) 0.1 % nasal spray Place 1 spray into both nostrils 2 (two) times daily. 06/01/19  Yes [provider]  cyclobenzaprine (FLEXERIL) 10 MG tablet Take 1 tablet (10 mg total) by mouth at bedtime. 09/21/19  Yes Johnson, Megan P, DO  dexlansoprazole (DEXILANT) 60 MG capsule Take 1 capsule (60 mg total) by mouth daily. 08/16/20  Yes Johnson, Megan P, DO  fluticasone (FLONASE) 50 MCG/ACT nasal spray Place 2 sprays into both nostrils daily. 06/07/20  Yes [provider]  glucose blood (ONE TOUCH ULTRA TEST) test strip USE TO TEST DAILY 12/15/16  Yes  Johnson, Megan P, DO  losartan (COZAAR) 50 MG tablet Take 1 tablet (50 mg total) by mouth daily. 08/16/20  Yes Johnson, Megan P, DO  meloxicam (MOBIC) 15 MG tablet Take 15 mg by mouth daily. 01/01/20  Yes [provider]  naproxen (NAPROSYN) 500 MG tablet Take 1 tablet (500 mg total) by mouth 2 (two) times daily with a meal. 08/16/20  Yes Johnson, Megan P, DO  ONETOUCH DELICA LANCETS FINE MISC USE TO TEST DAILY 12/15/16  Yes Johnson, Megan P, DO  rosuvastatin (CRESTOR) 40 MG tablet Take 1 tablet (40 mg total) by mouth daily. 08/19/20  Yes Johnson, Megan P, DO    Allergies as of 08/05/2020 - Review Complete 07/28/2020  Allergen Reaction Noted   Lisinopril Cough 05/30/2015    Family History  Problem Relation Age of Onset   Cancer Mother        Lung   Arthritis Mother    Cancer Father        colon   Cancer Maternal Grandmother        breast, brain and colon   Lymphoma Paternal Grandmother    Cancer Paternal Grandfather        lung and throat    Social History   Socioeconomic History   Marital status: Married    Spouse name: Not on file   Number of children: Not on file   Years of education: Not on file   Highest education level: Not  on file  Occupational History   Not on file  Tobacco Use   Smoking status: Former    Types: Cigarettes    Quit date: 06/25/2005    Years since quitting: 15.1   Smokeless tobacco: Former  Building services engineer Use: Never used  Substance and Sexual Activity   Alcohol use: Yes   Drug use: No   Sexual activity: Yes  Other Topics Concern   Not on file  Social History Narrative   Not on file   Social Determinants of Health   Financial Resource Strain: Not on file  Food Insecurity: Not on file  Transportation Needs: Not on file  Physical Activity: Not on file  Stress: Not on file  Social Connections: Not on file  Intimate Partner Violence: Not on file    Review of Systems: See HPI, otherwise negative  ROS  Constitutional: General:   Alert,  Well-developed, well-nourished, pleasant and cooperative in NAD BP (!) 140/99   Pulse 81   Temp (!) 96.5 F (35.8 C) (Temporal)   Resp 20   Ht 5\' 10"  (1.778 m)   Wt 87.1 kg   SpO2 98%   BMI 27.55 kg/m   Head: Normocephalic, atraumatic.   Eyes:  Sclera clear, no icterus.   Conjunctiva pink.   Mouth:  No deformity or lesions, oropharynx pink & moist.  Neck:  Supple, trachea midline  Respiratory: Normal respiratory effort  Gastrointestinal:  Soft, non-tender and non-distended without masses, hepatosplenomegaly or hernias noted.  No guarding or rebound tenderness.     Cardiac: No clubbing or edema.  No cyanosis. Normal posterior tibial pedal pulses noted.  Lymphatic:  No significant cervical adenopathy.  Psych:  Alert and cooperative. Normal mood and affect.  Musculoskeletal:   Symmetrical without gross deformities. 5/5 Lower extremity strength bilaterally.  Skin: Warm. Intact without significant lesions or rashes. No jaundice.  Neurologic:  Face symmetrical, tongue midline, Normal sensation to touch;  grossly normal neurologically.  Psych:  Alert and oriented x3, Alert and cooperative. Normal mood and affect.  Impression/Plan: is here for a colonoscopy to be performed for average risk screening and EGD for Acid Reflux and follow up of gastric intestinal metaplasia.  Risks, benefits, limitations, and alternatives regarding the procedures have been reviewed with the patient.  Questions have been answered.  All parties agreeable.   [name], MD  08/21/2020, 10:48 AM

## 2020-08-21 NOTE — Anesthesia Preprocedure Evaluation (Signed)
Anesthesia Evaluation  Patient identified by MRN, date of birth, ID band Patient awake    Reviewed: Allergy & Precautions, NPO status , Patient's Chart, lab work & pertinent test results  Airway Mallampati: II  TM Distance: >3 FB Neck ROM: full    Dental no notable dental hx.    Pulmonary former smoker,    Pulmonary exam normal        Cardiovascular hypertension, Pt. on medications Normal cardiovascular exam     Neuro/Psych negative neurological ROS  negative psych ROS   GI/Hepatic Neg liver ROS, GERD  ,  Endo/Other  diabetes  Renal/GU negative Renal ROS  negative genitourinary   Musculoskeletal  (+) Arthritis , Osteoarthritis,    Abdominal Normal abdominal exam  (+)   Peds  Hematology negative hematology ROS (+)   Anesthesia Other Findings Past Medical History: No date: Allergy No date: Arthritis No date: Back pain No date: Diabetes mellitus without complication (HCC) No date: GERD (gastroesophageal reflux disease) No date: Hyperlipidemia No date: Hypertension  Past Surgical History: 10/25/2015: COLONOSCOPY WITH PROPOFOL; N/A     Comment:  Procedure: COLONOSCOPY WITH PROPOFOL;  Surgeon: Scot Jun, MD;  Location: [facility] ENDOSCOPY;  Service:               Endoscopy;  Laterality: N/A; 11/02/2019: ESOPHAGOGASTRODUODENOSCOPY (EGD) WITH PROPOFOL; N/A     Comment:  Procedure: ESOPHAGOGASTRODUODENOSCOPY (EGD) WITH               PROPOFOL;  Surgeon: Pasty Spillers, MD;  Location:               [facility] ENDOSCOPY;  Service: Endoscopy;  Laterality: N/A; No date: FRACTURE SURGERY 01/26/2010: HAND SURGERY; Right     Comment:  fracture- 5 pins and reconstruction 01/27/2000: HERNIA REPAIR     Comment:  Double 01/26/2006: NASAL SINUS SURGERY     Comment:  Maxillary Sinus Removed No date: STERIOD INJECTION No date: ulcer on his larynx  BMI    Body Mass Index: 27.55 kg/m       Reproductive/Obstetrics negative OB ROS                             Anesthesia Physical Anesthesia Plan  ASA: 2  Anesthesia Plan: General   Post-op Pain Management:    Induction: Intravenous  PONV Risk Score and Plan: Propofol infusion and TIVA  Airway Management Planned: Natural Airway and Nasal Cannula  Additional Equipment:   Intra-op Plan:   Post-operative Plan:   Informed Consent: I have reviewed the patients History and Physical, chart, labs and discussed the procedure including the risks, benefits and alternatives for the proposed anesthesia with the patient or authorized representative who has indicated his/her understanding and acceptance.     Dental Advisory Given  Plan Discussed with: Anesthesiologist, CRNA and Surgeon  Anesthesia Plan Comments: (Patient consented for risks of anesthesia including but not limited to:  - adverse reactions to medications - risk of airway placement if required - damage to eyes, teeth, lips or other oral mucosa - nerve damage due to positioning  - sore throat or hoarseness - Damage to heart, brain, nerves, lungs, other parts of body or loss of life  Patient voiced understanding.)        Anesthesia Quick Evaluation

## 2020-08-21 NOTE — Op Note (Signed)
[facility] Gastroenterology Patient Name: Stephen Mayo Procedure Date: 08/21/2020 10:47 AM MRN: [id number] Account #: [id number] Date of Birth: 1970-12-26 Admit Type: Outpatient Age: 50 Room: [facility] ENDO ROOM 3 Gender: Male Note Status: Finalized Procedure:             Colonoscopy Indications:           Screening for colorectal malignant neoplasm Providers:             Rosemary Pentecost B. Maximino Greenland MD, MD Medicines:             Monitored Anesthesia Care Complications:         No immediate complications. Procedure:             Pre-Anesthesia Assessment:                        - Prior to the procedure, a History and Physical was                         performed, and patient medications, allergies and                         sensitivities were reviewed. The patient's tolerance                         of previous anesthesia was reviewed.                        - The risks and benefits of the procedure and the                         sedation options and risks were discussed with the                         patient. All questions were answered and informed                         consent was obtained.                        - Patient identification and proposed procedure were                         verified prior to the procedure by the physician, the                         nurse, the anesthetist and the technician. The                         procedure was verified in the pre-procedure area in                         the procedure room in the endoscopy suite.                        - ASA Grade Assessment: II - A patient with mild                         systemic disease.                        -  After reviewing the risks and benefits, the patient                         was deemed in satisfactory condition to undergo the                         procedure.                        After obtaining informed consent, the colonoscope was                         passed under  direct vision. Throughout the procedure,                         the patient's blood pressure, pulse, and oxygen                         saturations were monitored continuously. The                         Colonoscope was introduced through the anus and                         advanced to the the cecum, identified by appendiceal                         orifice and ileocecal valve. The colonoscopy was                         performed with ease. The patient tolerated the                         procedure well. The quality of the bowel preparation                         was fair. Water and suction was used to clean the                         colon. The cecum and ascending colon had a poor prep                         but was cleaned to improve visualization Findings:      The perianal exam findings include non-thrombosed external hemorrhoids.      Multiple diverticula were found in the sigmoid colon.      The exam was otherwise without abnormality.      The rectum, sigmoid colon, descending colon, transverse colon, ascending       colon and cecum appeared normal.      Non-bleeding internal hemorrhoids were found during retroflexion.      Images were not able to be taken due to provation software error. IT       staff is working on resolving the issue. Impression:            - Preparation of the colon was fair.                        - Non-thrombosed external hemorrhoids found on  perianal exam.                        - Diverticulosis in the sigmoid colon.                        - The examination was otherwise normal.                        - The rectum, sigmoid colon, descending colon,                         transverse colon, ascending colon and cecum are normal.                        - Non-bleeding internal hemorrhoids.                        - No specimens collected. Recommendation:        - Discharge patient to home.                        - Resume previous  diet.                        - Continue present medications.                        - Repeat colonoscopy in 1 year, with 2 day prep                         because the bowel preparation was suboptimal.                        - Return to primary care physician as previously                         scheduled.                        - The findings and recommendations were discussed with                         the patient.                        - The findings and recommendations were discussed with                         the patient's family.                        - High fiber diet. Procedure Code(s):     --- Professional ---                        [phone], Colonoscopy, flexible; diagnostic, including                         collection of specimen(s) by brushing or washing, when                         performed (separate  procedure) Diagnosis Code(s):     --- Professional ---                        Z12.11, Encounter for screening for malignant neoplasm                         of colon CPT copyright 2019 American Medical Association. All rights reserved. The codes documented in this report are preliminary and upon coder review may  be revised to meet current compliance requirements.  Melodie BouillonVarnita Blenda Wisecup, MD Michel BickersVarnita B. Maximino Greenlandahiliani MD, MD 08/21/2020 12:15:34 PM This report has been signed electronically. Number of Addenda: 0 Note Initiated On: 08/21/2020 10:47 AM      [facility]

## 2020-08-21 NOTE — Op Note (Addendum)
[facility] Gastroenterology Patient Name: Stephen Mayo Procedure Date: 08/21/2020 11:03 AM MRN: [id number] Account #: [id number] Date of Birth: 1970/05/31 Admit Type: Outpatient Age: 50 Room: [facility] ENDO ROOM 3 Gender: Male Note Status: Finalized Procedure:             Upper GI endoscopy Indications:           Heartburn, Follow-up of intestinal metaplasia Providers:             Margueritte Guthridge B. Maximino Greenland MD, MD Medicines:             Monitored Anesthesia Care Complications:         No immediate complications. Procedure:             Pre-Anesthesia Assessment:                        - Prior to the procedure, a History and Physical was                         performed, and patient medications, allergies and                         sensitivities were reviewed. The patient's tolerance                         of previous anesthesia was reviewed.                        - The risks and benefits of the procedure and the                         sedation options and risks were discussed with the                         patient. All questions were answered and informed                         consent was obtained.                        - Patient identification and proposed procedure were                         verified prior to the procedure by the physician, the                         nurse, the anesthesiologist, the anesthetist and the                         technician. The procedure was verified in the                         procedure room.                        - ASA Grade Assessment: II - A patient with mild                         systemic disease.  After obtaining informed consent, the endoscope was                         passed under direct vision. Throughout the procedure,                         the patient's blood pressure, pulse, and oxygen                         saturations were monitored continuously. The Endoscope                          was introduced through the mouth, and advanced to the                         second part of duodenum. The upper GI endoscopy was                         accomplished with ease. The patient tolerated the                         procedure well. Findings:      A single area of ectopic gastric mucosa was found in the proximal       esophagus.      The exam of the esophagus was otherwise normal.      The examined esophagus was normal.      Patchy mildly erythematous mucosa without bleeding was found in the       gastric antrum. Biopsies were taken with a cold forceps for histology.       Biopsies were obtained in the gastric body, at the incisura and in the       gastric antrum with cold forceps for histology.      The duodenal bulb, second portion of the duodenum and examined duodenum       were normal.      Due to provation error images were not able to be captured for the       procedure. IT staff is working on the issue. Impression:            - Ectopic gastric mucosa in the proximal esophagus.                        - Normal esophagus.                        - Erythematous mucosa in the antrum. Biopsied.                        - Normal duodenal bulb, second portion of the duodenum                         and examined duodenum.                        - Biopsies were obtained in the gastric body, at the                         incisura and in the gastric antrum. Recommendation:        - Await pathology  results.                        - Discharge patient to home (with escort).                        - Advance diet as tolerated.                        - Continue present medications.                        - Patient has a contact number available for                         emergencies. The signs and symptoms of potential                         delayed complications were discussed with the patient.                         Return to normal activities tomorrow. Written                          discharge instructions were provided to the patient.                        - Discharge patient to home (with escort).                        - The findings and recommendations were discussed with                         the patient.                        - The findings and recommendations were discussed with                         the patient's family. Procedure Code(s):     --- Professional ---                        [phone] [id number], Esophagogastroduodenoscopy, flexible,                         transoral; with biopsy, single or multiple Diagnosis Code(s):     --- Professional ---                        Q40.2, Other specified congenital malformations of                         stomach                        K31.89, Other diseases of stomach and duodenum                        R12, Heartburn CPT copyright 2019 American Medical Association. All rights reserved. The codes documented in this report are preliminary and upon coder review may  be revised to meet current compliance requirements.  Melodie Bouillon, MD Michel Bickers B. Maximino Greenland MD, MD 08/21/2020 12:01:15 PM This report has been signed electronically. Number of Addenda: 0 Note Initiated On: 08/21/2020 11:03 AM Estimated Blood Loss:  Estimated blood loss: none.      [facility]

## 2020-08-21 NOTE — Transfer of Care (Signed)
Immediate Anesthesia Transfer of Care Note  Patient: Stephen Mayo  Procedure(s) Performed: COLONOSCOPY WITH PROPOFOL ESOPHAGOGASTRODUODENOSCOPY (EGD) WITH PROPOFOL  Patient Location: Endoscopy Unit  Anesthesia Type:General  Level of Consciousness: awake  Airway & Oxygen Therapy: Patient Spontanous Breathing  Post-op Assessment: Report given to RN and Post -op Vital signs reviewed and stable  Post vital signs: Reviewed  Last Vitals:  Vitals Value Taken Time  BP    Temp    Pulse 83 08/21/20 1215  Resp    SpO2 96 % 08/21/20 1215  Vitals shown include unvalidated device data.  Last Pain:  Vitals:   08/21/20 0904  TempSrc: Temporal  PainSc: 0-No pain         Complications: No notable events documented.

## 2020-08-22 ENCOUNTER — Encounter: Payer: Self-pay | Admitting: Gastroenterology

## 2020-08-22 LAB — SURGICAL PATHOLOGY

## 2020-10-09 ENCOUNTER — Other Ambulatory Visit: Payer: Self-pay

## 2020-10-09 MED ORDER — ROSUVASTATIN CALCIUM 40 MG PO TABS: 40.0000 mg | ORAL_TABLET | Freq: Every day | ORAL | 1 refills | Status: DC

## 2020-10-15 ENCOUNTER — Ambulatory Visit: Payer: Self-pay | Admitting: *Deleted

## 2020-10-15 NOTE — Telephone Encounter (Signed)
Tasha from [employer] called in states patient is in disease managmnt program and he is showing elevated readings and she wants to discuss this. Please call back   Called Tasha from [employer] and reviewed information regarding patient elevated B/P. Rodney Booze reports patient is enrolled in a remote monitoring of B/P readings. B/P noted to be elevated on multiple readings in the 150/90's range. Patient currently taking losartan 50 mg daily . Rodney Booze will fax readings noted from the remote monitoring readings to review if PCP would like to monitor or adjust any current medications or dosages. Tasha did not report c/o headache, blurred vision, dizziness from patient. Future appt scheduled for 02/17/21.

## 2020-10-16 NOTE — Telephone Encounter (Signed)
Routing to provider to advise.  

## 2020-10-17 NOTE — Telephone Encounter (Signed)
Earlier appt please

## 2020-10-28 ENCOUNTER — Other Ambulatory Visit: Payer: Self-pay

## 2020-10-28 ENCOUNTER — Ambulatory Visit: Payer: BC Managed Care – PPO | Admitting: Family Medicine

## 2020-10-28 ENCOUNTER — Encounter: Payer: Self-pay | Admitting: Family Medicine

## 2020-10-28 DIAGNOSIS — I10 Essential (primary) hypertension: Secondary | ICD-10-CM | POA: Diagnosis not present

## 2020-10-28 MED ORDER — HYDROCHLOROTHIAZIDE 12.5 MG PO CAPS: 12.5000 mg | ORAL_CAPSULE | Freq: Every day | ORAL | 2 refills | Status: DC

## 2020-10-28 MED ORDER — RABEPRAZOLE SODIUM 20 MG PO TBEC: 20.0000 mg | DELAYED_RELEASE_TABLET | Freq: Every day | ORAL | 1 refills | Status: DC

## 2020-10-28 NOTE — Assessment & Plan Note (Signed)
Has been running high. Will add 12.5mg  HCTZ and recheck next visit.

## 2020-10-28 NOTE — Progress Notes (Signed)
BP 132/85 (BP Location: Left Arm, Patient Position: Sitting, Cuff Size: Large)   Pulse 78   Temp 98.2 F (36.8 C) (Oral)   Resp 16   Ht 5\' 10"  (1.778 m)   Wt 189 lb 9.6 oz (86 kg)   SpO2 98%   BMI 27.20 kg/m    Subjective:    Patient ID: , male    DOB: 1970-03-27, 49 y.o.   MRN: [id number]  HPI: Stephen Mayo is a 50 y.o. male  Chief Complaint  Patient presents with   Hypertension   HYPERTENSION Hypertension status: worse  Satisfied with current treatment? yes Duration of hypertension: chronic BP monitoring frequency:  a few times a week BP range: 120s-130s at work, 140s-150s at home BP medication side effects:  no Medication compliance: excellent compliance Previous BP meds:losartan Aspirin: no Recurrent headaches: no Visual changes: no Palpitations: no Dyspnea: no Chest pain: no Lower extremity edema: no Dizzy/lightheaded: no  Relevant past medical, surgical, family and social history reviewed and updated as indicated. Interim medical history since our last visit reviewed. Allergies and medications reviewed and updated.  Review of Systems  Constitutional: Negative.   Respiratory: Negative.    Cardiovascular: Negative.   Gastrointestinal: Negative.   Musculoskeletal: Negative.   Neurological: Negative.   Psychiatric/Behavioral: Negative.     Per HPI unless specifically indicated above     Objective:    BP 132/85 (BP Location: Left Arm, Patient Position: Sitting, Cuff Size: Large)   Pulse 78   Temp 98.2 F (36.8 C) (Oral)   Resp 16   Ht 5\' 10"  (1.778 m)   Wt 189 lb 9.6 oz (86 kg)   SpO2 98%   BMI 27.20 kg/m   Wt Readings from Last 3 Encounters:  10/28/20 189 lb 9.6 oz (86 kg)  08/21/20 192 lb (87.1 kg)  08/16/20 193 lb 4 oz (87.7 kg)    Physical Exam Vitals and nursing note reviewed.  Constitutional:      General: He is not in acute distress.    Appearance: Normal appearance. He is not ill-appearing, toxic-appearing or  diaphoretic.  HENT:     Head: Normocephalic and atraumatic.     Right Ear: External ear normal.     Left Ear: External ear normal.     Nose: Nose normal.     Mouth/Throat:     Mouth: Mucous membranes are moist.     Pharynx: Oropharynx is clear.  Eyes:     General: No scleral icterus.       Right eye: No discharge.        Left eye: No discharge.     Extraocular Movements: Extraocular movements intact.     Conjunctiva/sclera: Conjunctivae normal.     Pupils: Pupils are equal, round, and reactive to light.  Cardiovascular:     Rate and Rhythm: Normal rate and regular rhythm.     Pulses: Normal pulses.     Heart sounds: Normal heart sounds. No murmur heard.   No friction rub. No gallop.  Pulmonary:     Effort: Pulmonary effort is normal. No respiratory distress.     Breath sounds: Normal breath sounds. No stridor. No wheezing, rhonchi or rales.  Chest:     Chest wall: No tenderness.  Musculoskeletal:        General: Normal range of motion.     Cervical back: Normal range of motion and neck supple.  Skin:    General: Skin is warm and dry.  Capillary Refill: Capillary refill takes less than 2 seconds.     Coloration: Skin is not jaundiced or pale.     Findings: No bruising, erythema, lesion or rash.  Neurological:     General: No focal deficit present.     Mental Status: He is alert and oriented to person, place, and time. Mental status is at baseline.  Psychiatric:        Mood and Affect: Mood normal.        Behavior: Behavior normal.        Thought Content: Thought content normal.        Judgment: Judgment normal.    Results for orders placed or performed during the hospital encounter of 08/21/20  Glucose, capillary  Result Value Ref Range   Glucose-Capillary 168 (H) 70 - 99 mg/dL  Surgical pathology  Result Value Ref Range   SURGICAL PATHOLOGY      SURGICAL PATHOLOGY CASE: [id number] PATIENT: Stephen Mayo Surgical Pathology Report     Specimen  Submitted: A. Stomach, greater curvature antrum; cbx B. Stomach, lesser curvature antrum; cbx C. Stomach, incisura, cbx D. Stomach, greater curvature body E. Stomach, lesser curvature body; cbx  Clinical History: Colon caner screening, gastroesophageal reflux disease, follow up gastric intestinal metaplasia. Findings: Gastric erythema, diverticulosis, hemorrhoids.     DIAGNOSIS: A.  STOMACH, GREATER CURVATURE ANTRUM; COLD BIOPSY: - ANTRAL AND TRANSITIONAL-TYPE MUCOSA WITH MILD REACTIVE FOVEOLAR HYPERPLASIA AND CONGESTION. - NEGATIVE FOR ACTIVE INFLAMMATION, H. PYLORI, INTESTINAL METAPLASIA, DYSPLASIA, AND MALIGNANCY.  B.  STOMACH, LESSER CURVATURE ANTRUM; COLD BIOPSY: - ANTRAL MUCOSA WITH MILD REACTIVE FOVEOLAR HYPERPLASIA AND CONGESTION. - NEGATIVE FOR ACTIVE INFLAMMATION, H. PYLORI, INTESTINAL METAPLASIA, DYSPLASIA, AND MALIGNANCY.  C.  STOMACH, INCIS URA; COLD BIOPSY: - OXYNTIC AND TRANSITIONAL-TYPE MUCOSA WITH MILD REACTIVE FOVEOLAR HYPERPLASIA, MINIMAL CHRONIC INFLAMMATION, EDEMA, AND CONGESTION. - NEGATIVE FOR ACTIVE INFLAMMATION, H. PYLORI, INTESTINAL METAPLASIA, DYSPLASIA, AND MALIGNANCY.  D. STOMACH, GREATER CURVATURE BODY; COLD BIOPSY: - OXYNTIC MUCOSA WITH CHANGES COMPATIBLE WITH PROTON PUMP INHIBITOR EFFECT, AND MILD REACTIVE FOVEOLAR HYPERPLASIA. - NEGATIVE FOR ATROPHY, ACTIVE INFLAMMATION, H. PYLORI, INTESTINAL METAPLASIA, DYSPLASIA, AND MALIGNANCY.  E. STOMACH, LESSER CURVATURE BODY; COLD BIOPSY: - OXYNTIC MUCOSA WITH CHANGES COMPATIBLE WITH PROTON PUMP INHIBITOR EFFECT, AND MILD REACTIVE FOVEOLAR HYPERPLASIA. - NEGATIVE FOR ATROPHY, ACTIVE INFLAMMATION, H. PYLORI, INTESTINAL METAPLASIA, DYSPLASIA, AND MALIGNANCY.  Comment: All of the samples are negative for intestinal metaplasia. There is no histologic evidence of atrophic gastritis.  GROSS DESCRIPTION: A. Labeled: cbx greater curvature antrum  Received: Formalin Collection time: 11:51 AM on  08/21/2020 Placed into formalin time: 11:51 AM on 08/21/2020 Tissue fragment(s): Multiple Size: Aggregate, 0.7 x 0.6 x 0.2 cm Description: Tan-pink soft tissue fragments Entirely submitted in 1 cassette.  B. Labeled: cbx lesser curvature antrum Received: Formalin Collection time: 11:52 AM on 08/21/2020 Placed into formalin time: 11:52 AM on 08/21/2020 Tissue fragment(s): 2 Size: Each 0.4 cm Description: Tan soft tissue fragments Entirely submitted in 1 cassette.  C. Labeled: cbx incisura Received: Formalin Collection time: 11:54 AM on 08/21/2020 Placed into formalin time: 11:54 AM on 08/21/2020 Tissue fragment(s): 2 Size: Range from 0.4-0.6 cm Description: Tan soft tissue fragments Entirely submitted in 1 cassette.  D. Labeled: cbx greater curvature body Received: Formalin Collection time: 11:55 AM on 08/21/2020 Placed into formalin time: 11:55 AM on 08/21/2020 Tissue fragment(s): 3 Size: Aggregate, 1 x 0.3 x 0.2 cm  Description: Tan soft tissue fragments Entirely submitted in 1 cassette.  E. Labeled: cbx lesser curvature body Received: Formalin Collection  time: 11:56 AM on 08/21/2020 Placed into formalin time: 11:56 AM on 08/21/2020 Tissue fragment(s): Multiple Size: Aggregate, 1 x 0.4 x 0.2 cm Description: Tan soft tissue fragments Entirely submitted in 1 cassette.  RB 08/21/2020  Final Diagnosis performed by Ronald Lobo, MD.   Electronically signed 08/22/2020 11:05:38AM The electronic signature indicates that the named Attending Pathologist has evaluated the specimen Technical component performed at [facility], [address] [place], [place] [address] Lab: [phone] Dir: Jolene Schimke, MD, MMM  Professional component performed at [facility], [address] [place], [place], [place] [address] Lab: [phone] Dir: Georgiann Cocker. Oneita Kras, MD       Assessment & Plan:   Problem List Items Addressed This Visit       Cardiovascular and Mediastinum   HTN  (hypertension)    Has been running high. Will add 12.5mg  HCTZ and recheck next visit.       Relevant Medications   hydrochlorothiazide (MICROZIDE) 12.5 MG capsule     Follow up plan: Return as scheduled.

## 2020-12-10 ENCOUNTER — Other Ambulatory Visit: Payer: Self-pay

## 2020-12-10 MED ORDER — HYDROCHLOROTHIAZIDE 12.5 MG PO CAPS: 12.5000 mg | ORAL_CAPSULE | Freq: Every day | ORAL | 0 refills | Status: DC

## 2020-12-10 NOTE — Telephone Encounter (Signed)
Requesting 90 day supply.

## 2021-02-17 ENCOUNTER — Ambulatory Visit: Payer: BC Managed Care – PPO | Admitting: Family Medicine

## 2021-02-18 ENCOUNTER — Encounter: Payer: Self-pay | Admitting: Family Medicine

## 2021-02-18 ENCOUNTER — Ambulatory Visit: Payer: BC Managed Care – PPO | Admitting: Family Medicine

## 2021-02-18 ENCOUNTER — Other Ambulatory Visit: Payer: Self-pay

## 2021-02-18 ENCOUNTER — Other Ambulatory Visit: Payer: Self-pay | Admitting: Family Medicine

## 2021-02-18 VITALS — BP 136/80 | HR 76 | Temp 98.1°F | Wt 202.2 lb

## 2021-02-18 DIAGNOSIS — E119 Type 2 diabetes mellitus without complications: Secondary | ICD-10-CM

## 2021-02-18 DIAGNOSIS — I10 Essential (primary) hypertension: Secondary | ICD-10-CM | POA: Diagnosis not present

## 2021-02-18 DIAGNOSIS — N6341 Unspecified lump in right breast, subareolar: Secondary | ICD-10-CM

## 2021-02-18 DIAGNOSIS — E782 Mixed hyperlipidemia: Secondary | ICD-10-CM

## 2021-02-18 DIAGNOSIS — Z1231 Encounter for screening mammogram for malignant neoplasm of breast: Secondary | ICD-10-CM

## 2021-02-18 LAB — MICROALBUMIN, URINE WAIVED
Creatinine, Urine Waived: 200 mg/dL (ref 10–300)
Microalb, Ur Waived: 10 mg/L (ref 0–19)
Microalb/Creat Ratio: <30 mg/g (ref <30)

## 2021-02-18 LAB — BAYER DCA HB A1C WAIVED: HB A1C (BAYER DCA - WAIVED): 7.4 % — ABNORMAL HIGH (ref 4.8–5.6)

## 2021-02-18 MED ORDER — ROSUVASTATIN CALCIUM 40 MG PO TABS: 40.0000 mg | ORAL_TABLET | Freq: Every day | ORAL | 1 refills | Status: DC

## 2021-02-18 MED ORDER — HYDROCHLOROTHIAZIDE 12.5 MG PO CAPS: 12.5000 mg | ORAL_CAPSULE | Freq: Every day | ORAL | 1 refills | Status: DC

## 2021-02-18 MED ORDER — RABEPRAZOLE SODIUM 20 MG PO TBEC: 20.0000 mg | DELAYED_RELEASE_TABLET | Freq: Every day | ORAL | 1 refills | Status: DC

## 2021-02-18 MED ORDER — LOSARTAN POTASSIUM 50 MG PO TABS: 50.0000 mg | ORAL_TABLET | Freq: Every day | ORAL | 1 refills | Status: DC

## 2021-02-18 NOTE — Patient Instructions (Signed)
Please call to schedule your mammogram and/or bone density: °[facility]  °Address: [address], [place], [place] [address]  °Phone: [phone] ° °

## 2021-02-18 NOTE — Progress Notes (Signed)
BP 136/80    Pulse 76    Temp 98.1 F (36.7 C) (Oral)    Wt 202 lb 3.2 oz (91.7 kg)    SpO2 98%    BMI 29.01 kg/m    Subjective:    Patient ID: Stephen Mayo, male    DOB: 08/04/1970, 51 y.o.   MRN: [id number]  HPI: EXODUS WHITCHURCH is a 51 y.o. male  Chief Complaint  Patient presents with   Diabetes    Pt states he has not been checking his BS and that he has an eye exam scheduled for next month    Hyperlipidemia   Hypertension   DIABETES Hypoglycemic episodes:no Polydipsia/polyuria: no Visual disturbance: no Chest pain: no Paresthesias: no Glucose Monitoring: yes  Accucheck frequency:  pretty often Taking Insulin?: no Blood Pressure Monitoring: not checking Retinal Examination: Up to Date Foot Exam: Up to Date Diabetic Education: Not Completed Pneumovax: Up to Date Influenza: Up to Date Aspirin: yes  HYPERTENSION / HYPERLIPIDEMIA Satisfied with current treatment? yes Duration of hypertension: chronic BP monitoring frequency: not checking BP medication side effects: no Past BP meds: losartan, HCTZ Duration of hyperlipidemia: chronic Cholesterol medication side effects: no Cholesterol supplements: none Past cholesterol medications: crestor Medication compliance: excellent compliance Aspirin: no Recent stressors: no Recurrent headaches: no Visual changes: no Palpitations: no Dyspnea: no Chest pain: no Lower extremity edema: no Dizzy/lightheaded: no  BREAST PAIN Duration :2 month Location: right Onset: sudden Severity: mild Quality: tender Frequency: constant Redness: no Swelling: yes Trauma: no trauma Nipple discharge: no Breast lump: yes Status: stable Treatments attempted: none Previous mammogram: no  Relevant past medical, surgical, family and social history reviewed and updated as indicated. Interim medical history since our last visit reviewed. Allergies and medications reviewed and updated.  Review of Systems  Constitutional:  Negative.   Respiratory: Negative.    Cardiovascular: Negative.   Gastrointestinal: Negative.   Musculoskeletal: Negative.   Neurological: Negative.   Psychiatric/Behavioral: Negative.     Per HPI unless specifically indicated above     Objective:    BP 136/80    Pulse 76    Temp 98.1 F (36.7 C) (Oral)    Wt 202 lb 3.2 oz (91.7 kg)    SpO2 98%    BMI 29.01 kg/m   Wt Readings from Last 3 Encounters:  02/18/21 202 lb 3.2 oz (91.7 kg)  10/28/20 189 lb 9.6 oz (86 kg)  08/21/20 192 lb (87.1 kg)    Physical Exam Vitals and nursing note reviewed.  Constitutional:      General: He is not in acute distress.    Appearance: Normal appearance. He is not ill-appearing, toxic-appearing or diaphoretic.  HENT:     Head: Normocephalic and atraumatic.     Right Ear: External ear normal.     Left Ear: External ear normal.     Nose: Nose normal.     Mouth/Throat:     Mouth: Mucous membranes are moist.     Pharynx: Oropharynx is clear.  Eyes:     General: No scleral icterus.       Right eye: No discharge.        Left eye: No discharge.     Extraocular Movements: Extraocular movements intact.     Conjunctiva/sclera: Conjunctivae normal.     Pupils: Pupils are equal, round, and reactive to light.  Cardiovascular:     Rate and Rhythm: Normal rate and regular rhythm.     Pulses: Normal pulses.  Heart sounds: Normal heart sounds. No murmur heard.   No friction rub. No gallop.  Pulmonary:     Effort: Pulmonary effort is normal. No respiratory distress.     Breath sounds: Normal breath sounds. No stridor. No wheezing, rhonchi or rales.  Chest:     Chest wall: No tenderness.    Musculoskeletal:        General: Normal range of motion.     Cervical back: Normal range of motion and neck supple.  Skin:    General: Skin is warm and dry.     Capillary Refill: Capillary refill takes less than 2 seconds.     Coloration: Skin is not jaundiced or pale.     Findings: No bruising, erythema,  lesion or rash.  Neurological:     General: No focal deficit present.     Mental Status: He is alert and oriented to person, place, and time. Mental status is at baseline.  Psychiatric:        Mood and Affect: Mood normal.        Behavior: Behavior normal.        Thought Content: Thought content normal.        Judgment: Judgment normal.    Results for orders placed or performed during the hospital encounter of 08/21/20  Glucose, capillary  Result Value Ref Range   Glucose-Capillary 168 (H) 70 - 99 mg/dL  Surgical pathology  Result Value Ref Range   SURGICAL PATHOLOGY      SURGICAL PATHOLOGY CASE: [id number] PATIENT: [facility]     Specimen Submitted: A. Stomach, greater curvature antrum; cbx B. Stomach, lesser curvature antrum; cbx C. Stomach, incisura, cbx D. Stomach, greater curvature body E. Stomach, lesser curvature body; cbx  Clinical History: Colon caner screening, gastroesophageal reflux disease, follow up gastric intestinal metaplasia. Findings: Gastric erythema, diverticulosis, hemorrhoids.     DIAGNOSIS: A.  STOMACH, GREATER CURVATURE ANTRUM; COLD BIOPSY: - ANTRAL AND TRANSITIONAL-TYPE MUCOSA WITH MILD REACTIVE FOVEOLAR HYPERPLASIA AND CONGESTION. - NEGATIVE FOR ACTIVE INFLAMMATION, H. PYLORI, INTESTINAL METAPLASIA, DYSPLASIA, AND MALIGNANCY.  B.  STOMACH, LESSER CURVATURE ANTRUM; COLD BIOPSY: - ANTRAL MUCOSA WITH MILD REACTIVE FOVEOLAR HYPERPLASIA AND CONGESTION. - NEGATIVE FOR ACTIVE INFLAMMATION, H. PYLORI, INTESTINAL METAPLASIA, DYSPLASIA, AND MALIGNANCY.  C.  STOMACH, INCIS URA; COLD BIOPSY: - OXYNTIC AND TRANSITIONAL-TYPE MUCOSA WITH MILD REACTIVE FOVEOLAR HYPERPLASIA, MINIMAL CHRONIC INFLAMMATION, EDEMA, AND CONGESTION. - NEGATIVE FOR ACTIVE INFLAMMATION, H. PYLORI, INTESTINAL METAPLASIA, DYSPLASIA, AND MALIGNANCY.  D. STOMACH, GREATER CURVATURE BODY; COLD BIOPSY: - OXYNTIC MUCOSA WITH CHANGES COMPATIBLE WITH  PROTON PUMP INHIBITOR EFFECT, AND MILD REACTIVE FOVEOLAR HYPERPLASIA. - NEGATIVE FOR ATROPHY, ACTIVE INFLAMMATION, H. PYLORI, INTESTINAL METAPLASIA, DYSPLASIA, AND MALIGNANCY.  E. STOMACH, LESSER CURVATURE BODY; COLD BIOPSY: - OXYNTIC MUCOSA WITH CHANGES COMPATIBLE WITH PROTON PUMP INHIBITOR EFFECT, AND MILD REACTIVE FOVEOLAR HYPERPLASIA. - NEGATIVE FOR ATROPHY, ACTIVE INFLAMMATION, H. PYLORI, INTESTINAL METAPLASIA, DYSPLASIA, AND MALIGNANCY.  Comment: All of the samples are negative for intestinal metaplasia. There is no histologic evidence of atrophic gastritis.  GROSS DESCRIPTION: A. Labeled: cbx greater curvature antrum  Received: Formalin Collection time: 11:51 AM on 08/21/2020 Placed into formalin time: 11:51 AM on 08/21/2020 Tissue fragment(s): Multiple Size: Aggregate, 0.7 x 0.6 x 0.2 cm Description: Tan-pink soft tissue fragments Entirely submitted in 1 cassette.  B. Labeled: cbx lesser curvature antrum Received: Formalin Collection time: 11:52 AM on 08/21/2020 Placed into formalin time: 11:52 AM on 08/21/2020 Tissue fragment(s): 2 Size: Each 0.4 cm Description: Tan soft tissue fragments  Entirely submitted in 1 cassette.  C. Labeled: cbx incisura Received: Formalin Collection time: 11:54 AM on 08/21/2020 Placed into formalin time: 11:54 AM on 08/21/2020 Tissue fragment(s): 2 Size: Range from 0.4-0.6 cm Description: Tan soft tissue fragments Entirely submitted in 1 cassette.  D. Labeled: cbx greater curvature body Received: Formalin Collection time: 11:55 AM on 08/21/2020 Placed into formalin time: 11:55 AM on 08/21/2020 Tissue fragment(s): 3 Size: Aggregate, 1 x 0.3 x 0.2 cm  Description: Tan soft tissue fragments Entirely submitted in 1 cassette.  E. Labeled: cbx lesser curvature body Received: Formalin Collection time: 11:56 AM on 08/21/2020 Placed into formalin time: 11:56 AM on 08/21/2020 Tissue fragment(s): Multiple Size: Aggregate, 1 x 0.4 x 0.2  cm Description: Tan soft tissue fragments Entirely submitted in 1 cassette.  RB 08/21/2020  Final Diagnosis performed by Bryan Lemma, MD.   Electronically signed 08/22/2020 11:05:38AM The electronic signature indicates that the named Attending Pathologist has evaluated the specimen Technical component performed at [facility], [address] [place], [place] [address] Lab: [phone] Dir: Rush Farmer, MD, MMM  Professional component performed at [facility], [facility], [place], [place], [place] [address] Lab: [phone] Dir: Dellia Nims. Reuel Derby, MD       Assessment & Plan:   Problem List Items Addressed This Visit       Cardiovascular and Mediastinum   HTN (hypertension)    Under good control on current regimen. Continue current regimen. Continue to monitor. Call with any concerns. Refills given. Labs drawn today.        Relevant Medications   losartan (COZAAR) 50 MG tablet   rosuvastatin (CRESTOR) 40 MG tablet   hydrochlorothiazide (MICROZIDE) 12.5 MG capsule     Endocrine   Diabetes mellitus type 2, diet-controlled ([place]) - Primary    Doing OK with A1c of 7.4. Continue diet and exercise. Call with any concerns. Continue to monitor.       Relevant Medications   losartan (COZAAR) 50 MG tablet   rosuvastatin (CRESTOR) 40 MG tablet   Other Relevant Orders   Bayer DCA Hb A1c Waived   Comprehensive metabolic panel   Lipid Panel w/o Chol/HDL Ratio   Microalbumin, Urine Waived     Other   HLD (hyperlipidemia)    Under good control on current regimen. Continue current regimen. Continue to monitor. Call with any concerns. Refills given. Labs drawn today.        Relevant Medications   losartan (COZAAR) 50 MG tablet   rosuvastatin (CRESTOR) 40 MG tablet   hydrochlorothiazide (MICROZIDE) 12.5 MG capsule   Other Relevant Orders   Lipid Panel w/o Chol/HDL Ratio   Other Visit Diagnoses     Subareolar mass of right breast       Will get him in for  mammo and [place]. Await results.    Relevant Orders   US BREAST LTD UNI RIGHT INC AXILLA        Follow up plan: Return in about 3 months (around 05/19/2021).

## 2021-02-18 NOTE — Assessment & Plan Note (Signed)
Under good control on current regimen. Continue current regimen. Continue to monitor. Call with any concerns. Refills given. Labs drawn today.   

## 2021-02-18 NOTE — Assessment & Plan Note (Signed)
Doing OK with A1c of 7.4. Continue diet and exercise. Call with any concerns. Continue to monitor.

## 2021-02-19 LAB — COMPREHENSIVE METABOLIC PANEL
ALT: 33 IU/L (ref 0–44)
AST: 32 IU/L (ref 0–40)
Albumin/Globulin Ratio: 1.6 (ref 1.2–2.2)
Albumin: 4.6 g/dL (ref 4.0–5.0)
Alkaline Phosphatase: 115 IU/L (ref 44–121)
BUN/Creatinine Ratio: 18 (ref 9–20)
BUN: 17 mg/dL (ref 6–24)
Bilirubin Total: 0.5 mg/dL (ref 0.0–1.2)
CO2: 27 mmol/L (ref 20–29)
Calcium: 9.4 mg/dL (ref 8.7–10.2)
Chloride: 101 mmol/L (ref 96–106)
Creatinine, Ser: 0.93 mg/dL (ref 0.76–1.27)
Globulin, Total: 2.8 g/dL (ref 1.5–4.5)
Glucose: 150 mg/dL — ABNORMAL HIGH (ref 70–99)
Potassium: 4.3 mmol/L (ref 3.5–5.2)
Sodium: 141 mmol/L (ref 134–144)
Total Protein: 7.4 g/dL (ref 6.0–8.5)
eGFR: 101 mL/min/{1.73_m2} (ref >59)

## 2021-02-19 LAB — LIPID PANEL W/O CHOL/HDL RATIO
Cholesterol, Total: 153 mg/dL (ref 100–199)
HDL: 45 mg/dL (ref >39)
LDL Chol Calc (NIH): 55 mg/dL (ref 0–99)
Triglycerides: 347 mg/dL — ABNORMAL HIGH (ref 0–149)
VLDL Cholesterol Cal: 53 mg/dL — ABNORMAL HIGH (ref 5–40)

## 2021-02-25 ENCOUNTER — Ambulatory Visit
Admission: RE | Admit: 2021-02-25 | Discharge: 2021-02-25 | Disposition: A | Discharge status: Home / Self Care | Payer: BC Managed Care – PPO | Source: Ambulatory Visit | Attending: Family Medicine | Admitting: Family Medicine

## 2021-02-25 ENCOUNTER — Other Ambulatory Visit: Payer: Self-pay

## 2021-02-25 DIAGNOSIS — N6341 Unspecified lump in right breast, subareolar: Secondary | ICD-10-CM | POA: Diagnosis not present

## 2021-02-25 DIAGNOSIS — Z1231 Encounter for screening mammogram for malignant neoplasm of breast: Secondary | ICD-10-CM | POA: Insufficient documentation

## 2021-02-25 DIAGNOSIS — R922 Inconclusive mammogram: Secondary | ICD-10-CM | POA: Diagnosis not present

## 2021-02-25 DIAGNOSIS — N62 Hypertrophy of breast: Secondary | ICD-10-CM | POA: Diagnosis not present

## 2021-03-13 DIAGNOSIS — H524 Presbyopia: Secondary | ICD-10-CM | POA: Diagnosis not present

## 2021-03-13 DIAGNOSIS — H52223 Regular astigmatism, bilateral: Secondary | ICD-10-CM | POA: Diagnosis not present

## 2021-03-13 DIAGNOSIS — R7309 Other abnormal glucose: Secondary | ICD-10-CM | POA: Diagnosis not present

## 2021-03-13 DIAGNOSIS — H5213 Myopia, bilateral: Secondary | ICD-10-CM | POA: Diagnosis not present

## 2021-03-13 LAB — HM DIABETES EYE EXAM

## 2021-05-19 ENCOUNTER — Ambulatory Visit: Payer: BC Managed Care – PPO | Admitting: Family Medicine

## 2021-05-19 ENCOUNTER — Encounter: Payer: Self-pay | Admitting: Family Medicine

## 2021-05-19 VITALS — BP 147/87 | HR 70 | Temp 98.4°F | Wt 210.6 lb

## 2021-05-19 DIAGNOSIS — E119 Type 2 diabetes mellitus without complications: Secondary | ICD-10-CM

## 2021-05-19 DIAGNOSIS — R052 Subacute cough: Secondary | ICD-10-CM | POA: Diagnosis not present

## 2021-05-19 LAB — BAYER DCA HB A1C WAIVED: HB A1C (BAYER DCA - WAIVED): 7.9 % — ABNORMAL HIGH (ref 4.8–5.6)

## 2021-05-19 MED ORDER — ALBUTEROL SULFATE HFA 108 (90 BASE) MCG/ACT IN AERS: 2.0000 | INHALATION_SPRAY | Freq: Four times a day (QID) | RESPIRATORY_TRACT | 0 refills | Status: DC | PRN

## 2021-05-19 MED ORDER — ALBUTEROL SULFATE (2.5 MG/3ML) 0.083% IN NEBU
2.5000 mg | INHALATION_SOLUTION | Freq: Once | RESPIRATORY_TRACT | Status: AC
  Administered 2021-05-19: 2.5 mg via RESPIRATORY_TRACT

## 2021-05-19 MED ORDER — METFORMIN HCL 500 MG PO TABS: 500.0000 mg | ORAL_TABLET | Freq: Every day | ORAL | 3 refills | Status: DC

## 2021-05-19 NOTE — Assessment & Plan Note (Signed)
Going in the wrong direction with A1c of 7.9. Will start metformin and recheck 3 months. Call with any concerns or if not getting better.  ?

## 2021-05-19 NOTE — Progress Notes (Signed)
? ?BP (!) 147/87   Pulse 70   Temp 98.4 ?F (36.9 ?C)   Wt 210 lb 9.6 oz (95.5 kg)   SpO2 98%   BMI 30.22 kg/m?   ? ?Subjective:  ? ? Patient ID: Stephen Mayo, male    DOB: March 31, 1970, 51 y.o.   MRN: [id number] ? ?HPI: ?Stephen Mayo is a 51 y.o. male ? ?Chief Complaint  ?Patient presents with  ? Diabetes  ? Cough  ?  Patient states he has been coughing off and on for 5 weeks.   ? ?DIABETES ?Hypoglycemic episodes:no ?Polydipsia/polyuria: no ?Visual disturbance: no ?Chest pain: no ?Paresthesias: no ?Glucose Monitoring: no ? Accucheck frequency: Not Checking ?Taking Insulin?: no ?Blood Pressure Monitoring: not checking ?Retinal Examination: Up to Date ?Foot Exam: Up to Date ?Diabetic Education: Completed ?Pneumovax: Up to Date ?Influenza: Up to Date ?Aspirin: no ? ? ?UPPER RESPIRATORY TRACT INFECTION ?Duration: 5 weeks ?Worst symptom: cough ?Fever: no ?Cough: yes ?Shortness of breath: no ?Wheezing: no ?Chest pain: no ?Chest tightness: no ?Chest congestion: no ?Nasal congestion: yes ?Runny nose: yes ?Post nasal drip: yes ?Sneezing: no ?Sore throat: no ?Swollen glands: no ?Sinus pressure: no ?Headache: no ?Face pain: no ?Toothache: no ?Ear pain: no  ?Ear pressure: yes bilateral ?Eyes red/itching:no ?Eye drainage/crusting: no  ?Vomiting: no ?Rash: no ?Fatigue: yes ?Sick contacts: yes ?Strep contacts: no  ?Context: better ?Recurrent sinusitis: no ?Relief with OTC cold/cough medications: no  ?Treatments attempted: cold/sinus and pseudoephedrine  ? ?Relevant past medical, surgical, family and social history reviewed and updated as indicated. Interim medical history since our last visit reviewed. ?Allergies and medications reviewed and updated. ? ?Review of Systems  ?Constitutional: Negative.   ?HENT: Negative.    ?Respiratory:  Positive for cough. Negative for apnea, choking, chest tightness, shortness of breath, wheezing and stridor.   ?Cardiovascular: Negative.   ?Gastrointestinal: Negative.   ?Musculoskeletal:  Negative.   ?Psychiatric/Behavioral: Negative.    ? ?Per HPI unless specifically indicated above ? ?   ?Objective:  ?  ?BP (!) 147/87   Pulse 70   Temp 98.4 ?F (36.9 ?C)   Wt 210 lb 9.6 oz (95.5 kg)   SpO2 98%   BMI 30.22 kg/m?   ?Wt Readings from Last 3 Encounters:  ?05/19/21 210 lb 9.6 oz (95.5 kg)  ?02/18/21 202 lb 3.2 oz (91.7 kg)  ?10/28/20 189 lb 9.6 oz (86 kg)  ?  ?Physical Exam ?Vitals and nursing note reviewed.  ?Constitutional:   ?   General: He is not in acute distress. ?   Appearance: Normal appearance. He is not ill-appearing, toxic-appearing or diaphoretic.  ?HENT:  ?   Head: Normocephalic and atraumatic.  ?   Right Ear: External ear normal.  ?   Left Ear: External ear normal.  ?   Nose: Nose normal.  ?   Mouth/Throat:  ?   Mouth: Mucous membranes are moist.  ?   Pharynx: Oropharynx is clear.  ?Eyes:  ?   General: No scleral icterus.    ?   Right eye: No discharge.     ?   Left eye: No discharge.  ?   Extraocular Movements: Extraocular movements intact.  ?   Conjunctiva/sclera: Conjunctivae normal.  ?   Pupils: Pupils are equal, round, and reactive to light.  ?Cardiovascular:  ?   Rate and Rhythm: Normal rate and regular rhythm.  ?   Pulses: Normal pulses.  ?   Heart sounds: Normal heart sounds. No murmur heard. ?  No friction rub. No gallop.  ?Pulmonary:  ?   Effort: Pulmonary effort is normal. No respiratory distress.  ?   Breath sounds: Normal breath sounds. No stridor. No wheezing, rhonchi or rales.  ?Chest:  ?   Chest wall: No tenderness.  ?Musculoskeletal:     ?   General: Normal range of motion.  ?   Cervical back: Normal range of motion and neck supple.  ?Skin: ?   General: Skin is warm and dry.  ?   Capillary Refill: Capillary refill takes less than 2 seconds.  ?   Coloration: Skin is not jaundiced or pale.  ?   Findings: No bruising, erythema, lesion or rash.  ?Neurological:  ?   General: No focal deficit present.  ?   Mental Status: He is alert and oriented to person, place, and time.  Mental status is at baseline.  ?Psychiatric:     ?   Mood and Affect: Mood normal.     ?   Behavior: Behavior normal.     ?   Thought Content: Thought content normal.     ?   Judgment: Judgment normal.  ? ? ?Results for orders placed or performed in visit on 03/14/21  ?HM DIABETES EYE EXAM  ?Result Value Ref Range  ? HM Diabetic Eye Exam No Retinopathy No Retinopathy  ? ?   ?Assessment & Plan:  ? ?Problem List Items Addressed This Visit   ? ?  ? Endocrine  ? Diabetes mellitus type 2, diet-controlled ([facility]) - Primary  ?  Going in the wrong direction with A1c of 7.9. Will start metformin and recheck 3 months. Call with any concerns or if not getting better.  ? ?  ?  ? Relevant Medications  ? metFORMIN (GLUCOPHAGE) 500 MG tablet  ? Other Relevant Orders  ? Bayer DCA Hb A1c Waived  ? ?Other Visit Diagnoses   ? ? Subacute cough      ? Likely post-infectous. Lungs clearer after neb. Will start albuterol. Call with any concerns or if not improving.  ? Relevant Medications  ? albuterol (PROVENTIL) (2.5 MG/3ML) 0.083% nebulizer solution 2.5 mg (Completed)  ? ?  ?  ? ?Follow up plan: ?Return in about 3 months (around 08/18/2021). ? ? ? ? ? ?

## 2021-06-24 DIAGNOSIS — M189 Osteoarthritis of first carpometacarpal joint, unspecified: Secondary | ICD-10-CM | POA: Diagnosis not present

## 2021-07-30 ENCOUNTER — Encounter: Payer: Self-pay | Admitting: Family Medicine

## 2021-08-18 ENCOUNTER — Ambulatory Visit: Payer: BC Managed Care – PPO | Admitting: Family Medicine

## 2021-08-19 ENCOUNTER — Encounter: Payer: Self-pay | Admitting: Family Medicine

## 2021-08-19 ENCOUNTER — Ambulatory Visit: Payer: BC Managed Care – PPO | Admitting: Family Medicine

## 2021-08-19 VITALS — BP 129/81 | HR 80 | Temp 98.1°F | Wt 221.3 lb

## 2021-08-19 DIAGNOSIS — E119 Type 2 diabetes mellitus without complications: Secondary | ICD-10-CM | POA: Diagnosis not present

## 2021-08-19 DIAGNOSIS — Z125 Encounter for screening for malignant neoplasm of prostate: Secondary | ICD-10-CM | POA: Diagnosis not present

## 2021-08-19 DIAGNOSIS — K2100 Gastro-esophageal reflux disease with esophagitis, without bleeding: Secondary | ICD-10-CM

## 2021-08-19 DIAGNOSIS — Z0000 Encounter for general adult medical examination without abnormal findings: Secondary | ICD-10-CM | POA: Diagnosis not present

## 2021-08-19 DIAGNOSIS — I10 Essential (primary) hypertension: Secondary | ICD-10-CM

## 2021-08-19 DIAGNOSIS — E782 Mixed hyperlipidemia: Secondary | ICD-10-CM

## 2021-08-19 LAB — URINALYSIS, ROUTINE W REFLEX MICROSCOPIC
Bilirubin, UA: NEGATIVE
Ketones, UA: NEGATIVE
Leukocytes,UA: NEGATIVE
Nitrite, UA: NEGATIVE
Protein,UA: NEGATIVE
RBC, UA: NEGATIVE
Specific Gravity, UA: 1.015 (ref 1.005–1.030)
Urobilinogen, Ur: 0.2 mg/dL (ref 0.2–1.0)
pH, UA: 5.5 (ref 5.0–7.5)

## 2021-08-19 LAB — BAYER DCA HB A1C WAIVED: HB A1C (BAYER DCA - WAIVED): 10.9 % — ABNORMAL HIGH (ref 4.8–5.6)

## 2021-08-19 LAB — MICROALBUMIN, URINE WAIVED
Creatinine, Urine Waived: 100 mg/dL (ref 10–300)
Microalb, Ur Waived: 10 mg/L (ref 0–19)
Microalb/Creat Ratio: <30 mg/g (ref <30)

## 2021-08-19 MED ORDER — METFORMIN HCL 500 MG PO TABS: 500.0000 mg | ORAL_TABLET | Freq: Every day | ORAL | 1 refills | Status: DC

## 2021-08-19 MED ORDER — HYDROCHLOROTHIAZIDE 12.5 MG PO CAPS: 12.5000 mg | ORAL_CAPSULE | Freq: Every day | ORAL | 1 refills | Status: DC

## 2021-08-19 MED ORDER — RABEPRAZOLE SODIUM 20 MG PO TBEC: 20.0000 mg | DELAYED_RELEASE_TABLET | Freq: Every day | ORAL | 1 refills | Status: DC

## 2021-08-19 MED ORDER — ROSUVASTATIN CALCIUM 40 MG PO TABS
40.0000 mg | ORAL_TABLET | Freq: Every day | ORAL | 1 refills | Status: DC
Start: 2021-08-19 — End: 2022-02-20

## 2021-08-19 MED ORDER — RYBELSUS 7 MG PO TABS: 7.0000 mg | ORAL_TABLET | Freq: Every day | ORAL | 1 refills | Status: DC

## 2021-08-19 MED ORDER — LOSARTAN POTASSIUM 50 MG PO TABS
50.0000 mg | ORAL_TABLET | Freq: Every day | ORAL | 1 refills | Status: DC
Start: 2021-08-19 — End: 2022-02-20

## 2021-08-19 MED ORDER — RYBELSUS 3 MG PO TABS: 3.0000 mg | ORAL_TABLET | Freq: Every day | ORAL | 0 refills | Status: DC

## 2021-08-19 NOTE — Assessment & Plan Note (Signed)
Not doing well with A1c of 10.9 up from 7.9. Continue metformin and add rybelsus. Recheck 3 months. Call with any concerns.

## 2021-08-19 NOTE — Assessment & Plan Note (Signed)
Under good control on current regimen. Continue current regimen. Continue to monitor. Call with any concerns. Refills given. Labs drawn today.   

## 2021-08-19 NOTE — Progress Notes (Signed)
BP 129/81   Pulse 80   Temp 98.1 F (36.7 C)   Wt 221 lb 4.8 oz (100.4 kg)   SpO2 98%   BMI 31.75 kg/m    Subjective:    Patient ID: Stephen Mayo, male    DOB: February 16, 1970, 51 y.o.   MRN: [id number]  HPI: Stephen Mayo is a 51 y.o. male presenting on 08/19/2021 for comprehensive medical examination. Current medical complaints include:  DIABETES Hypoglycemic episodes:no Polydipsia/polyuria: no Visual disturbance: no Chest pain: no Paresthesias: yes Glucose Monitoring: no  Accucheck frequency:  ran out of strips Taking Insulin?: no Blood Pressure Monitoring: not checking Retinal Examination: Up to Date Foot Exam: Up to Date Diabetic Education: Completed Pneumovax: Up to Date Influenza: Up to Date Aspirin: yes  HYPERTENSION / HYPERLIPIDEMIA Satisfied with current treatment? no Duration of hypertension: chronic BP monitoring frequency: not checking BP medication side effects: no Past BP meds: losartan, HCTZ Duration of hyperlipidemia: chronic Cholesterol medication side effects: no Cholesterol supplements: none Past cholesterol medications: crestor Medication compliance: excellent compliance Aspirin: no Recent stressors: no Recurrent headaches: no Visual changes: no Palpitations: no Dyspnea: no Chest pain: no Lower extremity edema: no Dizzy/lightheaded: no  Interim Problems from his last visit: no  Depression Screen done today and results listed below:     08/19/2021    3:14 PM 05/19/2021    3:36 PM 02/18/2021    9:15 AM 08/16/2020    2:23 PM 08/14/2019   10:19 PM  Depression screen PHQ 2/9  Decreased Interest 0 0 0 0 0  Down, Depressed, Hopeless 0 0 0 0 0  PHQ - 2 Score 0 0 0 0 0  Altered sleeping 0 0 0  1  Tired, decreased energy 0 0 0  0  Change in appetite 0 0 0  0  Feeling bad or failure about yourself  0 0 0  0  Trouble concentrating 0 0 0  0  Moving slowly or fidgety/restless 0 0 0  0  Suicidal thoughts 0 0 0  0  PHQ-9 Score 0 0 0  1   Difficult doing work/chores Not difficult at all  Not difficult at all  Not difficult at all    Past Medical History:  Past Medical History:  Diagnosis Date   Allergy    Arthritis    Back pain    Diabetes mellitus without complication (HCC)    GERD (gastroesophageal reflux disease)    Hyperlipidemia    Hypertension     Surgical History:  Past Surgical History:  Procedure Laterality Date   COLONOSCOPY WITH PROPOFOL N/A 10/25/2015   Procedure: COLONOSCOPY WITH PROPOFOL;  Surgeon: Scot Jun, MD;  Location: [facility] ENDOSCOPY;  Service: Endoscopy;  Laterality: N/A;   COLONOSCOPY WITH PROPOFOL N/A 08/21/2020   Procedure: COLONOSCOPY WITH PROPOFOL;  Surgeon: Pasty Spillers, MD;  Location: [facility] ENDOSCOPY;  Service: Endoscopy;  Laterality: N/A;   ESOPHAGOGASTRODUODENOSCOPY (EGD) WITH PROPOFOL N/A 11/02/2019   Procedure: ESOPHAGOGASTRODUODENOSCOPY (EGD) WITH PROPOFOL;  Surgeon: Pasty Spillers, MD;  Location: [facility] ENDOSCOPY;  Service: Endoscopy;  Laterality: N/A;   ESOPHAGOGASTRODUODENOSCOPY (EGD) WITH PROPOFOL N/A 08/21/2020   Procedure: ESOPHAGOGASTRODUODENOSCOPY (EGD) WITH PROPOFOL;  Surgeon: Pasty Spillers, MD;  Location: [facility] ENDOSCOPY;  Service: Endoscopy;  Laterality: N/A;   FRACTURE SURGERY     HAND SURGERY Right 01/26/2010   fracture- 5 pins and reconstruction   HERNIA REPAIR  01/27/2000   Double   NASAL SINUS SURGERY  01/26/2006   Maxillary Sinus  Removed   STERIOD INJECTION     ulcer on his larynx      Medications:  Current Outpatient Medications on File Prior to Visit  Medication Sig   albuterol (VENTOLIN HFA) 108 (90 Base) MCG/ACT inhaler Inhale 2 puffs into the lungs every 6 (six) hours as needed for wheezing or shortness of breath.   azelastine (ASTELIN) 0.1 % nasal spray Place 1 spray into both nostrils 2 (two) times daily.   fluticasone (FLONASE) 50 MCG/ACT nasal spray Place 2 sprays into both nostrils daily.   glucose blood (ONE TOUCH ULTRA TEST)  test strip USE TO TEST DAILY   ONETOUCH DELICA LANCETS FINE MISC USE TO TEST DAILY   No current facility-administered medications on file prior to visit.    Allergies:  Allergies  Allergen Reactions   Lisinopril Cough    Social History:  Social History   Socioeconomic History   Marital status: Married    Spouse name: Not on file   Number of children: Not on file   Years of education: Not on file   Highest education level: Not on file  Occupational History   Not on file  Tobacco Use   Smoking status: Former    Types: Cigarettes    Quit date: 06/25/2005    Years since quitting: 16.1   Smokeless tobacco: Former  Building services engineer Use: Never used  Substance and Sexual Activity   Alcohol use: Yes   Drug use: No   Sexual activity: Yes  Other Topics Concern   Not on file  Social History Narrative   Not on file   Social Determinants of Health   Financial Resource Strain: Not on file  Food Insecurity: Not on file  Transportation Needs: Not on file  Physical Activity: Not on file  Stress: Not on file  Social Connections: Not on file  Intimate Partner Violence: Not on file   Social History   Tobacco Use  Smoking Status Former   Types: Cigarettes   Quit date: 06/25/2005   Years since quitting: 16.1  Smokeless Tobacco Former   Social History   Substance and Sexual Activity  Alcohol Use Yes    Family History:  Family History  Problem Relation Age of Onset   Cancer Mother        Lung   Arthritis Mother    Cancer Father        colon   Cancer Maternal Grandmother        breast, brain and colon   Lymphoma Paternal Grandmother    Cancer Paternal Grandfather        lung and throat    Past medical history, surgical history, medications, allergies, family history and social history reviewed with patient today and changes made to appropriate areas of the chart.   Review of Systems  Constitutional: Negative.   HENT: Negative.    Eyes:  Positive for blurred  vision. Negative for double vision, photophobia, pain, discharge and redness.  Respiratory: Negative.    Cardiovascular: Negative.   Gastrointestinal: Negative.   Skin: Negative.   Neurological: Negative.   Endo/Heme/Allergies:  Positive for environmental allergies. Negative for polydipsia. Does not bruise/bleed easily.  Psychiatric/Behavioral: Negative.     All other ROS negative except what is listed above and in the HPI.      Objective:    BP 129/81   Pulse 80   Temp 98.1 F (36.7 C)   Wt 221 lb 4.8 oz (100.4 kg)   SpO2  98%   BMI 31.75 kg/m   Wt Readings from Last 3 Encounters:  08/19/21 221 lb 4.8 oz (100.4 kg)  05/19/21 210 lb 9.6 oz (95.5 kg)  02/18/21 202 lb 3.2 oz (91.7 kg)    Physical Exam Vitals and nursing note reviewed.  Constitutional:      General: He is not in acute distress.    Appearance: Normal appearance. He is obese. He is not ill-appearing, toxic-appearing or diaphoretic.  HENT:     Head: Normocephalic and atraumatic.     Right Ear: Tympanic membrane, ear canal and external ear normal. There is no impacted cerumen.     Left Ear: Tympanic membrane, ear canal and external ear normal. There is no impacted cerumen.     Nose: Nose normal. No congestion or rhinorrhea.     Mouth/Throat:     Mouth: Mucous membranes are moist.     Pharynx: Oropharynx is clear. No oropharyngeal exudate or posterior oropharyngeal erythema.  Eyes:     General: No scleral icterus.       Right eye: No discharge.        Left eye: No discharge.     Extraocular Movements: Extraocular movements intact.     Conjunctiva/sclera: Conjunctivae normal.     Pupils: Pupils are equal, round, and reactive to light.  Neck:     Vascular: No carotid bruit.  Cardiovascular:     Rate and Rhythm: Normal rate and regular rhythm.     Pulses: Normal pulses.     Heart sounds: No murmur heard.    No friction rub. No gallop.  Pulmonary:     Effort: Pulmonary effort is normal. No respiratory  distress.     Breath sounds: Normal breath sounds. No stridor. No wheezing, rhonchi or rales.  Chest:     Chest wall: No tenderness.  Abdominal:     General: Abdomen is flat. Bowel sounds are normal. There is no distension.     Palpations: Abdomen is soft. There is no mass.     Tenderness: There is no abdominal tenderness. There is no right CVA tenderness, left CVA tenderness, guarding or rebound.     Hernia: No hernia is present.  Genitourinary:    Comments: Genital exam deferred with shared decision making Musculoskeletal:        General: No swelling, tenderness, deformity or signs of injury.     Cervical back: Normal range of motion and neck supple. No rigidity. No muscular tenderness.     Right lower leg: No edema.     Left lower leg: No edema.  Lymphadenopathy:     Cervical: No cervical adenopathy.  Skin:    General: Skin is warm and dry.     Capillary Refill: Capillary refill takes less than 2 seconds.     Coloration: Skin is not jaundiced or pale.     Findings: No bruising, erythema, lesion or rash.  Neurological:     General: No focal deficit present.     Mental Status: He is alert and oriented to person, place, and time.     Cranial Nerves: No cranial nerve deficit.     Sensory: No sensory deficit.     Motor: No weakness.     Coordination: Coordination normal.     Gait: Gait normal.     Deep Tendon Reflexes: Reflexes normal.  Psychiatric:        Mood and Affect: Mood normal.        Behavior: Behavior normal.  Thought Content: Thought content normal.        Judgment: Judgment normal.     Results for orders placed or performed in visit on 08/19/21  Bayer DCA Hb A1c Waived  Result Value Ref Range   HB A1C (BAYER DCA - WAIVED) 10.9 (H) 4.8 - 5.6 %  Microalbumin, Urine Waived  Result Value Ref Range   Microalb, Ur Waived 10 0 - 19 mg/L   Creatinine, Urine Waived 100 10 - 300 mg/dL   Microalb/Creat Ratio <30 <30 mg/g  Urinalysis, Routine w reflex microscopic   Result Value Ref Range   Specific Gravity, UA 1.015 1.005 - 1.030   pH, UA 5.5 5.0 - 7.5   Color, UA Yellow Yellow   Appearance Ur Clear Clear   Leukocytes,UA Negative Negative   Protein,UA Negative Negative/Trace   Glucose, UA 3+ (A) Negative   Ketones, UA Negative Negative   RBC, UA Negative Negative   Bilirubin, UA Negative Negative   Urobilinogen, Ur 0.2 0.2 - 1.0 mg/dL   Nitrite, UA Negative Negative      Assessment & Plan:   Problem List Items Addressed This Visit       Cardiovascular and Mediastinum   HTN (hypertension)    Under good control on current regimen. Continue current regimen. Continue to monitor. Call with any concerns. Refills given. Labs drawn today.       Relevant Medications   rosuvastatin (CRESTOR) 40 MG tablet   losartan (COZAAR) 50 MG tablet   hydrochlorothiazide (MICROZIDE) 12.5 MG capsule   Other Relevant Orders   Comprehensive metabolic panel   Microalbumin, Urine Waived (Completed)   CBC with Differential/Platelet     Digestive   Gastroesophageal reflux disease with esophagitis    Under good control on current regimen. Continue current regimen. Continue to monitor. Call with any concerns. Refills given. Labs drawn today.         Endocrine   Diabetes mellitus type 2, diet-controlled (HCC)    Not doing well with A1c of 10.9 up from 7.9. Continue metformin and add rybelsus. Recheck 3 months. Call with any concerns.       Relevant Medications   rosuvastatin (CRESTOR) 40 MG tablet   metFORMIN (GLUCOPHAGE) 500 MG tablet   losartan (COZAAR) 50 MG tablet   Semaglutide (RYBELSUS) 3 MG TABS   Semaglutide (RYBELSUS) 7 MG TABS   Other Relevant Orders   Bayer DCA Hb A1c Waived (Completed)   Comprehensive metabolic panel   Microalbumin, Urine Waived (Completed)   Urinalysis, Routine w reflex microscopic (Completed)     Other   HLD (hyperlipidemia)    Under good control on current regimen. Continue current regimen. Continue to monitor. Call  with any concerns. Refills given. Labs drawn today.       Relevant Medications   rosuvastatin (CRESTOR) 40 MG tablet   losartan (COZAAR) 50 MG tablet   hydrochlorothiazide (MICROZIDE) 12.5 MG capsule   Other Relevant Orders   Comprehensive metabolic panel   Lipid Panel w/o Chol/HDL Ratio   Other Visit Diagnoses     Routine general medical examination at a health care facility    -  Primary   Vaccines up to date. Screening labs checked today. Colonoscopy scheduled. Continue diet and exercise. Call with any concerns.    Screening for prostate cancer       Labs drawn today. Await results. Treat as needed.    Relevant Orders   PSA        Discussed aspirin prophylaxis  for myocardial infarction prevention and decision was it was not indicated  LABORATORY TESTING:  Health maintenance labs ordered today as discussed above.   The natural history of prostate cancer and ongoing controversy regarding screening and potential treatment outcomes of prostate cancer has been discussed with the patient. The meaning of a false positive PSA and a false negative PSA has been discussed. He indicates understanding of the limitations of this screening test and wishes to proceed with screening PSA testing.   IMMUNIZATIONS:   - Tdap: Tetanus vaccination status reviewed: last tetanus booster within 10 years. - Influenza: Postponed to flu season - Pneumovax: Up to date - Prevnar: Not applicable - COVID: Up to date - HPV: Not applicable - Shingrix vaccine: Not applicable  SCREENING: - Colonoscopy:  scheduled   Discussed with patient purpose of the colonoscopy is to detect colon cancer at curable precancerous or early stages   PATIENT COUNSELING:    Sexuality: Discussed sexually transmitted diseases, partner selection, use of condoms, avoidance of unintended pregnancy  and contraceptive alternatives.   Advised to avoid cigarette smoking.  I discussed with the patient that most people either  abstain from alcohol or drink within safe limits (<=14/week and <=4 drinks/occasion for males, <=7/weeks and <= 3 drinks/occasion for females) and that the risk for alcohol disorders and other health effects rises proportionally with the number of drinks per week and how often a drinker exceeds daily limits.  Discussed cessation/primary prevention of drug use and availability of treatment for abuse.   Diet: Encouraged to adjust caloric intake to maintain  or achieve ideal body weight, to reduce intake of dietary saturated fat and total fat, to limit sodium intake by avoiding high sodium foods and not adding table salt, and to maintain adequate dietary potassium and calcium preferably from fresh fruits, vegetables, and low-fat dairy products.    stressed the importance of regular exercise  Injury prevention: Discussed safety belts, safety helmets, smoke detector, smoking near bedding or upholstery.   Dental health: Discussed importance of regular tooth brushing, flossing, and dental visits.   Follow up plan: NEXT PREVENTATIVE PHYSICAL DUE IN 1 YEAR. Return in about 3 months (around 11/19/2021).

## 2021-08-20 LAB — COMPREHENSIVE METABOLIC PANEL
ALT: 43 IU/L (ref 0–44)
AST: 34 IU/L (ref 0–40)
Albumin/Globulin Ratio: 1.9 (ref 1.2–2.2)
Albumin: 4.4 g/dL (ref 4.1–5.1)
Alkaline Phosphatase: 148 IU/L — ABNORMAL HIGH (ref 44–121)
BUN/Creatinine Ratio: 12 (ref 9–20)
BUN: 11 mg/dL (ref 6–24)
Bilirubin Total: 0.4 mg/dL (ref 0.0–1.2)
CO2: 25 mmol/L (ref 20–29)
Calcium: 9.6 mg/dL (ref 8.7–10.2)
Chloride: 94 mmol/L — ABNORMAL LOW (ref 96–106)
Creatinine, Ser: 0.89 mg/dL (ref 0.76–1.27)
Globulin, Total: 2.3 g/dL (ref 1.5–4.5)
Glucose: 339 mg/dL — ABNORMAL HIGH (ref 70–99)
Potassium: 4.1 mmol/L (ref 3.5–5.2)
Sodium: 134 mmol/L (ref 134–144)
Total Protein: 6.7 g/dL (ref 6.0–8.5)
eGFR: 105 mL/min/{1.73_m2} (ref >59)

## 2021-08-20 LAB — LIPID PANEL W/O CHOL/HDL RATIO
Cholesterol, Total: 123 mg/dL (ref 100–199)
HDL: 47 mg/dL (ref >39)
LDL Chol Calc (NIH): 36 mg/dL (ref 0–99)
Triglycerides: 265 mg/dL — ABNORMAL HIGH (ref 0–149)
VLDL Cholesterol Cal: 40 mg/dL (ref 5–40)

## 2021-08-20 LAB — CBC WITH DIFFERENTIAL/PLATELET
Basophils Absolute: 0.1 10*3/uL (ref 0.0–0.2)
Basos: 1 %
EOS (ABSOLUTE): 0.2 10*3/uL (ref 0.0–0.4)
Eos: 3 %
Hematocrit: 46.5 % (ref 37.5–51.0)
Hemoglobin: 15.3 g/dL (ref 13.0–17.7)
Immature Grans (Abs): 0 10*3/uL (ref 0.0–0.1)
Immature Granulocytes: 0 %
Lymphocytes Absolute: 2.2 10*3/uL (ref 0.7–3.1)
Lymphs: 30 %
MCH: 30.2 pg (ref 26.6–33.0)
MCHC: 32.9 g/dL (ref 31.5–35.7)
MCV: 92 fL (ref 79–97)
Monocytes Absolute: 0.4 10*3/uL (ref 0.1–0.9)
Monocytes: 6 %
Neutrophils Absolute: 4.6 10*3/uL (ref 1.4–7.0)
Neutrophils: 60 %
Platelets: 208 10*3/uL (ref 150–450)
RBC: 5.07 x10E6/uL (ref 4.14–5.80)
RDW: 12.3 % (ref 11.6–15.4)
WBC: 7.5 10*3/uL (ref 3.4–10.8)

## 2021-08-20 LAB — PSA: Prostate Specific Ag, Serum: 0.1 ng/mL (ref 0.0–4.0)

## 2021-08-21 ENCOUNTER — Telehealth: Payer: Self-pay

## 2021-08-21 NOTE — Telephone Encounter (Signed)
PA initiated via CoverMyMeds for Rybelsus 7MG  KEY: [id number] Medication has been approved will notify patient

## 2021-10-14 ENCOUNTER — Encounter: Payer: Self-pay | Admitting: Family Medicine

## 2021-10-21 ENCOUNTER — Other Ambulatory Visit: Payer: Self-pay | Admitting: Family Medicine

## 2021-10-21 NOTE — Telephone Encounter (Signed)
Requested medication (s) are due for refill today: yes  Requested medication (s) are on the active medication list: yes  Last refill:  08/19/21 #30/0  Future visit scheduled: yes  Notes to clinic:  pharmacy requesting #90/3 RF. Unable to refill per protocol, medication not assigned to the refill protocol.      Requested Prescriptions  Pending Prescriptions Disp Refills   Semaglutide (RYBELSUS) 7 MG TABS 90 tablet 1    Sig: Take 7 mg by mouth daily.     Off-Protocol Failed - 10/21/2021  4:50 PM      Failed - Medication not assigned to a protocol, review manually.      Passed - Valid encounter within last 12 months    Recent Outpatient Visits           2 months ago Routine general medical examination at a health care facility   [facility], [place] P, DO   5 months ago Diabetes mellitus type 2, diet-controlled ([place])   [place], Megan P, DO   8 months ago Diabetes mellitus type 2, diet-controlled ([place])   [place], [place], DO   11 months ago Primary hypertension   [place], Megan P, DO   1 year ago Routine general medical examination at a health care facility   [place], Barb Merino, DO       Future Appointments             In 4 weeks Wynetta Emery, Barb Merino, DO [facility]

## 2021-10-21 NOTE — Telephone Encounter (Signed)
Deon with [facility] stated pt requests 90 day supply with 3 refills of the following:  Medication Refill - Medication: Semaglutide (RYBELSUS) 7 MG TABS  Has the patient contacted their pharmacy? Yes.    Preferred Pharmacy (with phone number or street name):  [facility] [place]  Phone:  [phone]  Fax:  [phone]     Has the patient been seen for an appointment in the last year OR does the patient have an upcoming appointment? Yes.    Agent: Please be advised that RX refills may take up to 3 business days. We ask that you follow-up with your pharmacy.

## 2021-11-18 ENCOUNTER — Ambulatory Visit: Payer: BC Managed Care – PPO | Admitting: Family Medicine

## 2021-11-18 ENCOUNTER — Encounter: Payer: Self-pay | Admitting: Family Medicine

## 2021-11-18 VITALS — BP 141/85 | HR 77 | Temp 98.7°F | Wt 208.2 lb

## 2021-11-18 DIAGNOSIS — E119 Type 2 diabetes mellitus without complications: Secondary | ICD-10-CM | POA: Diagnosis not present

## 2021-11-18 LAB — BAYER DCA HB A1C WAIVED: HB A1C (BAYER DCA - WAIVED): 6.9 % — ABNORMAL HIGH (ref 4.8–5.6)

## 2021-11-18 NOTE — Progress Notes (Signed)
BP (!) 141/85   Pulse 77   Temp 98.7 F (37.1 C)   Wt 208 lb 3.2 oz (94.4 kg)   SpO2 97%   BMI 29.87 kg/m    Subjective:    Patient ID: Stephen Mayo, male    DOB: [birth year], 51 y.o.   MRN: [id number]  HPI: Stephen Mayo is a 51 y.o. male  Chief Complaint  Patient presents with   Diabetes   DIABETES Hypoglycemic episodes:no Polydipsia/polyuria: no Visual disturbance: no Chest pain: no Paresthesias: no Glucose Monitoring: yes  Accucheck frequency: Daily  Fasting glucose: 120-178 Taking Insulin?: no Blood Pressure Monitoring: not checking Retinal Examination: Up to Date Foot Exam: Up to Date Diabetic Education: Completed Pneumovax: Up to Date Influenza: Up to Date Aspirin: no  Relevant past medical, surgical, family and social history reviewed and updated as indicated. Interim medical history since our last visit reviewed. Allergies and medications reviewed and updated.  Review of Systems  Constitutional: Negative.   Respiratory: Negative.    Cardiovascular: Negative.   Gastrointestinal: Negative.   Musculoskeletal: Negative.   Neurological: Negative.   Psychiatric/Behavioral: Negative.      Per HPI unless specifically indicated above     Objective:    BP (!) 141/85   Pulse 77   Temp 98.7 F (37.1 C)   Wt 208 lb 3.2 oz (94.4 kg)   SpO2 97%   BMI 29.87 kg/m   Wt Readings from Last 3 Encounters:  11/18/21 208 lb 3.2 oz (94.4 kg)  08/19/21 221 lb 4.8 oz (100.4 kg)  05/19/21 210 lb 9.6 oz (95.5 kg)    Physical Exam Vitals and nursing note reviewed.  Constitutional:      General: He is not in acute distress.    Appearance: Normal appearance. He is not ill-appearing, toxic-appearing or diaphoretic.  HENT:     Head: Normocephalic and atraumatic.     Right Ear: External ear normal.     Left Ear: External ear normal.     Nose: Nose normal.     Mouth/Throat:     Mouth: Mucous membranes are moist.     Pharynx: Oropharynx is clear.  Eyes:      General: No scleral icterus.       Right eye: No discharge.        Left eye: No discharge.     Extraocular Movements: Extraocular movements intact.     Conjunctiva/sclera: Conjunctivae normal.     Pupils: Pupils are equal, round, and reactive to light.  Cardiovascular:     Rate and Rhythm: Normal rate and regular rhythm.     Pulses: Normal pulses.     Heart sounds: Normal heart sounds. No murmur heard.    No friction rub. No gallop.  Pulmonary:     Effort: Pulmonary effort is normal. No respiratory distress.     Breath sounds: Normal breath sounds. No stridor. No wheezing, rhonchi or rales.  Chest:     Chest wall: No tenderness.  Musculoskeletal:        General: Normal range of motion.     Cervical back: Normal range of motion and neck supple.  Skin:    General: Skin is warm and dry.     Capillary Refill: Capillary refill takes less than 2 seconds.     Coloration: Skin is not jaundiced or pale.     Findings: No bruising, erythema, lesion or rash.  Neurological:     General: No focal deficit present.  Mental Status: He is alert and oriented to person, place, and time. Mental status is at baseline.  Psychiatric:        Mood and Affect: Mood normal.        Behavior: Behavior normal.        Thought Content: Thought content normal.        Judgment: Judgment normal.     Results for orders placed or performed in visit on 08/19/21  Bayer DCA Hb A1c Waived  Result Value Ref Range   HB A1C (BAYER DCA - WAIVED) 10.9 (H) 4.8 - 5.6 %  Comprehensive metabolic panel  Result Value Ref Range   Glucose 339 (H) 70 - 99 mg/dL   BUN 11 6 - 24 mg/dL   Creatinine, Ser 0.89 0.76 - 1.27 mg/dL   eGFR 105 >59 mL/min/1.73   BUN/Creatinine Ratio 12 9 - 20   Sodium 134 134 - 144 mmol/L   Potassium 4.1 3.5 - 5.2 mmol/L   Chloride 94 (L) 96 - 106 mmol/L   CO2 25 20 - 29 mmol/L   Calcium 9.6 8.7 - 10.2 mg/dL   Total Protein 6.7 6.0 - 8.5 g/dL   Albumin 4.4 4.1 - 5.1 g/dL   Globulin, Total  2.3 1.5 - 4.5 g/dL   Albumin/Globulin Ratio 1.9 1.2 - 2.2   Bilirubin Total 0.4 0.0 - 1.2 mg/dL   Alkaline Phosphatase 148 (H) 44 - 121 IU/L   AST 34 0 - 40 IU/L   ALT 43 0 - 44 IU/L  Lipid Panel w/o Chol/HDL Ratio  Result Value Ref Range   Cholesterol, Total 123 100 - 199 mg/dL   Triglycerides 265 (H) 0 - 149 mg/dL   HDL 47 >39 mg/dL   VLDL Cholesterol Cal 40 5 - 40 mg/dL   LDL Chol Calc (NIH) 36 0 - 99 mg/dL  Microalbumin, Urine Waived  Result Value Ref Range   Microalb, Ur Waived 10 0 - 19 mg/L   Creatinine, Urine Waived 100 10 - 300 mg/dL   Microalb/Creat Ratio <30 <30 mg/g  CBC with Differential/Platelet  Result Value Ref Range   WBC 7.5 3.4 - 10.8 x10E3/uL   RBC 5.07 4.14 - 5.80 x10E6/uL   Hemoglobin 15.3 13.0 - 17.7 g/dL   Hematocrit 46.5 37.5 - 51.0 %   MCV 92 79 - 97 fL   MCH 30.2 26.6 - 33.0 pg   MCHC 32.9 31.5 - 35.7 g/dL   RDW 12.3 11.6 - 15.4 %   Platelets 208 150 - 450 x10E3/uL   Neutrophils 60 Not Estab. %   Lymphs 30 Not Estab. %   Monocytes 6 Not Estab. %   Eos 3 Not Estab. %   Basos 1 Not Estab. %   Neutrophils Absolute 4.6 1.4 - 7.0 x10E3/uL   Lymphocytes Absolute 2.2 0.7 - 3.1 x10E3/uL   Monocytes Absolute 0.4 0.1 - 0.9 x10E3/uL   EOS (ABSOLUTE) 0.2 0.0 - 0.4 x10E3/uL   Basophils Absolute 0.1 0.0 - 0.2 x10E3/uL   Immature Granulocytes 0 Not Estab. %   Immature Grans (Abs) 0.0 0.0 - 0.1 x10E3/uL  PSA  Result Value Ref Range   Prostate Specific Ag, Serum 0.1 0.0 - 4.0 ng/mL  Urinalysis, Routine w reflex microscopic  Result Value Ref Range   Specific Gravity, UA 1.015 1.005 - 1.030   pH, UA 5.5 5.0 - 7.5   Color, UA Yellow Yellow   Appearance Ur Clear Clear   Leukocytes,UA Negative Negative   Protein,UA Negative  Negative/Trace   Glucose, UA 3+ (A) Negative   Ketones, UA Negative Negative   RBC, UA Negative Negative   Bilirubin, UA Negative Negative   Urobilinogen, Ur 0.2 0.2 - 1.0 mg/dL   Nitrite, UA Negative Negative      Assessment &  Plan:   Problem List Items Addressed This Visit       Endocrine   Diabetes mellitus type 2, diet-controlled ([place]) - Primary    Doing great with A1c of 6.9. Continue current regimen. Continue to monitor. Recheck 3 months. Call with any concerns.       Relevant Orders   Bayer DCA Hb A1c Waived     Follow up plan: Return in about 3 months (around 02/18/2022).

## 2021-11-18 NOTE — Assessment & Plan Note (Signed)
Doing great with A1c of 6.9. Continue current regimen. Continue to monitor. Recheck 3 months. Call with any concerns.

## 2021-11-20 ENCOUNTER — Ambulatory Visit: Payer: BC Managed Care – PPO | Admitting: Family Medicine

## 2021-12-10 ENCOUNTER — Other Ambulatory Visit: Payer: Self-pay | Admitting: Family Medicine

## 2021-12-10 MED ORDER — AZELASTINE HCL 0.1 % NA SOLN: 1.0000 | Freq: Two times a day (BID) | NASAL | 12 refills | Status: DC

## 2021-12-10 MED ORDER — FLUTICASONE PROPIONATE 50 MCG/ACT NA SUSP: 2.0000 | Freq: Every day | NASAL | 12 refills | Status: DC

## 2021-12-12 ENCOUNTER — Other Ambulatory Visit: Payer: Self-pay

## 2021-12-15 MED ORDER — RYBELSUS 7 MG PO TABS: 7.0000 mg | ORAL_TABLET | Freq: Every day | ORAL | 1 refills | Status: DC

## 2021-12-15 NOTE — Telephone Encounter (Signed)
The medication below. My bad thought I had tee'd up the medication for you Dr.Johnson. Please advise?

## 2022-02-20 ENCOUNTER — Encounter: Payer: Self-pay | Admitting: Family Medicine

## 2022-02-20 ENCOUNTER — Ambulatory Visit: Payer: BC Managed Care – PPO | Admitting: Family Medicine

## 2022-02-20 VITALS — BP 139/78 | HR 89 | Temp 99.0°F | Wt 205.5 lb

## 2022-02-20 DIAGNOSIS — Z1211 Encounter for screening for malignant neoplasm of colon: Secondary | ICD-10-CM

## 2022-02-20 DIAGNOSIS — Z23 Encounter for immunization: Secondary | ICD-10-CM | POA: Diagnosis not present

## 2022-02-20 DIAGNOSIS — E119 Type 2 diabetes mellitus without complications: Secondary | ICD-10-CM

## 2022-02-20 DIAGNOSIS — E782 Mixed hyperlipidemia: Secondary | ICD-10-CM | POA: Diagnosis not present

## 2022-02-20 DIAGNOSIS — G5761 Lesion of plantar nerve, right lower limb: Secondary | ICD-10-CM

## 2022-02-20 DIAGNOSIS — I10 Essential (primary) hypertension: Secondary | ICD-10-CM

## 2022-02-20 LAB — BAYER DCA HB A1C WAIVED: HB A1C (BAYER DCA - WAIVED): 6.9 % — ABNORMAL HIGH (ref 4.8–5.6)

## 2022-02-20 MED ORDER — ROSUVASTATIN CALCIUM 40 MG PO TABS: 40.0000 mg | ORAL_TABLET | Freq: Every day | ORAL | 1 refills | Status: DC

## 2022-02-20 MED ORDER — HYDROCHLOROTHIAZIDE 12.5 MG PO CAPS: 12.5000 mg | ORAL_CAPSULE | Freq: Every day | ORAL | 1 refills | Status: DC

## 2022-02-20 MED ORDER — METFORMIN HCL 500 MG PO TABS: 500.0000 mg | ORAL_TABLET | Freq: Every day | ORAL | 1 refills | Status: DC

## 2022-02-20 MED ORDER — ALBUTEROL SULFATE HFA 108 (90 BASE) MCG/ACT IN AERS: 2.0000 | INHALATION_SPRAY | Freq: Four times a day (QID) | RESPIRATORY_TRACT | 1 refills | Status: DC | PRN

## 2022-02-20 MED ORDER — RABEPRAZOLE SODIUM 20 MG PO TBEC: 20.0000 mg | DELAYED_RELEASE_TABLET | Freq: Every day | ORAL | 1 refills | Status: DC

## 2022-02-20 MED ORDER — RYBELSUS 7 MG PO TABS: 7.0000 mg | ORAL_TABLET | Freq: Every day | ORAL | 1 refills | Status: DC

## 2022-02-20 MED ORDER — LOSARTAN POTASSIUM 50 MG PO TABS: 50.0000 mg | ORAL_TABLET | Freq: Every day | ORAL | 1 refills | Status: DC

## 2022-02-20 NOTE — Assessment & Plan Note (Signed)
Doing great with A1c of 6.9- stable. Recheck 6 months. Continue current regimen. Call with any concerns.

## 2022-02-20 NOTE — Assessment & Plan Note (Signed)
Under good control on current regimen. Continue current regimen. Continue to monitor. Call with any concerns. Refills given. Labs drawn today.

## 2022-02-20 NOTE — Progress Notes (Signed)
BP 139/78   Pulse 89   Temp 99 F (37.2 C) (Oral)   Wt 205 lb 8 oz (93.2 kg)   SpO2 97%   BMI 29.49 kg/m    Subjective:    Patient ID: Stephen Mayo, male    DOB: 1970/03/27, 52 y.o.   MRN: [id number]  HPI: Stephen Mayo is a 52 y.o. male  Chief Complaint  Patient presents with   Diabetes   Hyperlipidemia   Hypertension   DIABETES Hypoglycemic episodes:no Polydipsia/polyuria: no Visual disturbance: no Chest pain: no Paresthesias: no Glucose Monitoring: yes Taking Insulin?: no Blood Pressure Monitoring: not checking Retinal Examination: Up to Date Foot Exam: Up to Date Diabetic Education: Completed Pneumovax: Up to Date Influenza: Up to Date Aspirin: no  HYPERTENSION / HYPERLIPIDEMIA Satisfied with current treatment? yes Duration of hypertension: chronic BP monitoring frequency: not checking BP medication side effects: no Past BP meds: losartan, HCTZ Duration of hyperlipidemia: chronic Cholesterol medication side effects: no Cholesterol supplements: none Past cholesterol medications: crestor Medication compliance: excellent compliance Aspirin: no Recent stressors: no Recurrent headaches: no Visual changes: no Palpitations: no Dyspnea: no Chest pain: no Lower extremity edema: no Dizzy/lightheaded: no  Relevant past medical, surgical, family and social history reviewed and updated as indicated. Interim medical history since our last visit reviewed. Allergies and medications reviewed and updated.  Review of Systems  Constitutional: Negative.   Respiratory: Negative.    Cardiovascular: Negative.   Musculoskeletal: Negative.   Psychiatric/Behavioral: Negative.      Per HPI unless specifically indicated above     Objective:    BP 139/78   Pulse 89   Temp 99 F (37.2 C) (Oral)   Wt 205 lb 8 oz (93.2 kg)   SpO2 97%   BMI 29.49 kg/m   Wt Readings from Last 3 Encounters:  02/20/22 205 lb 8 oz (93.2 kg)  11/18/21 208 lb 3.2 oz (94.4 kg)   08/19/21 221 lb 4.8 oz (100.4 kg)    Physical Exam Vitals and nursing note reviewed.  Constitutional:      General: He is not in acute distress.    Appearance: Normal appearance. He is not ill-appearing, toxic-appearing or diaphoretic.  HENT:     Head: Normocephalic and atraumatic.     Right Ear: External ear normal.     Left Ear: External ear normal.     Nose: Nose normal.     Mouth/Throat:     Mouth: Mucous membranes are moist.     Pharynx: Oropharynx is clear.  Eyes:     General: No scleral icterus.       Right eye: No discharge.        Left eye: No discharge.     Extraocular Movements: Extraocular movements intact.     Conjunctiva/sclera: Conjunctivae normal.     Pupils: Pupils are equal, round, and reactive to light.  Cardiovascular:     Rate and Rhythm: Normal rate and regular rhythm.     Pulses: Normal pulses.     Heart sounds: Normal heart sounds. No murmur heard.    No friction rub. No gallop.  Pulmonary:     Effort: Pulmonary effort is normal. No respiratory distress.     Breath sounds: Normal breath sounds. No stridor. No wheezing, rhonchi or rales.  Chest:     Chest wall: No tenderness.  Musculoskeletal:        General: Normal range of motion.     Cervical back: Normal range of motion and  neck supple.  Skin:    General: Skin is warm and dry.     Capillary Refill: Capillary refill takes less than 2 seconds.     Coloration: Skin is not jaundiced or pale.     Findings: No bruising, erythema, lesion or rash.  Neurological:     General: No focal deficit present.     Mental Status: He is alert and oriented to person, place, and time. Mental status is at baseline.  Psychiatric:        Mood and Affect: Mood normal.        Behavior: Behavior normal.        Thought Content: Thought content normal.        Judgment: Judgment normal.     Results for orders placed or performed in visit on 11/18/21  Bayer DCA Hb A1c Waived  Result Value Ref Range   HB A1C (BAYER  DCA - WAIVED) 6.9 (H) 4.8 - 5.6 %      Assessment & Plan:   Problem List Items Addressed This Visit       Cardiovascular and Mediastinum   HTN (hypertension)    Under good control on current regimen. Continue current regimen. Continue to monitor. Call with any concerns. Refills given. Labs drawn today.       Relevant Medications   hydrochlorothiazide (MICROZIDE) 12.5 MG capsule   losartan (COZAAR) 50 MG tablet   rosuvastatin (CRESTOR) 40 MG tablet     Endocrine   Diabetes mellitus type 2, diet-controlled ([place]) - Primary    Doing great with A1c of 6.9- stable. Recheck 6 months. Continue current regimen. Call with any concerns.       Relevant Medications   losartan (COZAAR) 50 MG tablet   metFORMIN (GLUCOPHAGE) 500 MG tablet   rosuvastatin (CRESTOR) 40 MG tablet   Semaglutide (RYBELSUS) 7 MG TABS   Other Relevant Orders   Bayer DCA Hb A1c Waived   CBC with Differential/Platelet   Comprehensive metabolic panel   Lipid Panel w/o Chol/HDL Ratio     Other   HLD (hyperlipidemia)    Under good control on current regimen. Continue current regimen. Continue to monitor. Call with any concerns. Refills given. Labs drawn today.      Relevant Medications   hydrochlorothiazide (MICROZIDE) 12.5 MG capsule   losartan (COZAAR) 50 MG tablet   rosuvastatin (CRESTOR) 40 MG tablet   Other Visit Diagnoses     Need for shingles vaccine       Shingrix #1 given today.   Relevant Orders   Zoster Recombinant (Shingrix ) (Completed)   Screening for colon cancer       Referral to GI placed today.   Relevant Orders   Ambulatory referral to Gastroenterology   Morton's neuroma of right foot       Referral to podiatry placed today. Call with any concerns.   Relevant Orders   Ambulatory referral to Podiatry        Follow up plan: Return in about 6 months (around 08/21/2022) for physical, nurse only in 3 months for shingrix #2.

## 2022-02-21 LAB — CBC WITH DIFFERENTIAL/PLATELET
Basophils Absolute: 0.1 10*3/uL (ref 0.0–0.2)
Basos: 1 %
EOS (ABSOLUTE): 0.2 10*3/uL (ref 0.0–0.4)
Eos: 2 %
Hematocrit: 45.1 % (ref 37.5–51.0)
Hemoglobin: 15 g/dL (ref 13.0–17.7)
Immature Grans (Abs): 0 10*3/uL (ref 0.0–0.1)
Immature Granulocytes: 0 %
Lymphocytes Absolute: 2.4 10*3/uL (ref 0.7–3.1)
Lymphs: 25 %
MCH: 30.1 pg (ref 26.6–33.0)
MCHC: 33.3 g/dL (ref 31.5–35.7)
MCV: 91 fL (ref 79–97)
Monocytes Absolute: 0.5 10*3/uL (ref 0.1–0.9)
Monocytes: 6 %
Neutrophils Absolute: 6.2 10*3/uL (ref 1.4–7.0)
Neutrophils: 66 %
Platelets: 202 10*3/uL (ref 150–450)
RBC: 4.98 x10E6/uL (ref 4.14–5.80)
RDW: 12.4 % (ref 11.6–15.4)
WBC: 9.3 10*3/uL (ref 3.4–10.8)

## 2022-02-21 LAB — COMPREHENSIVE METABOLIC PANEL
ALT: 23 IU/L (ref 0–44)
AST: 26 IU/L (ref 0–40)
Albumin/Globulin Ratio: 1.9 (ref 1.2–2.2)
Albumin: 4.6 g/dL (ref 4.1–5.1)
Alkaline Phosphatase: 98 IU/L (ref 44–121)
BUN/Creatinine Ratio: 16 (ref 9–20)
BUN: 14 mg/dL (ref 6–24)
Bilirubin Total: 0.6 mg/dL (ref 0.0–1.2)
CO2: 21 mmol/L (ref 20–29)
Calcium: 9.7 mg/dL (ref 8.7–10.2)
Chloride: 99 mmol/L (ref 96–106)
Creatinine, Ser: 0.9 mg/dL (ref 0.76–1.27)
Globulin, Total: 2.4 g/dL (ref 1.5–4.5)
Glucose: 120 mg/dL — ABNORMAL HIGH (ref 70–99)
Potassium: 4.1 mmol/L (ref 3.5–5.2)
Sodium: 138 mmol/L (ref 134–144)
Total Protein: 7 g/dL (ref 6.0–8.5)
eGFR: 104 mL/min/{1.73_m2} (ref >59)

## 2022-02-21 LAB — LIPID PANEL W/O CHOL/HDL RATIO
Cholesterol, Total: 128 mg/dL (ref 100–199)
HDL: 50 mg/dL (ref >39)
LDL Chol Calc (NIH): 50 mg/dL (ref 0–99)
Triglycerides: 171 mg/dL — ABNORMAL HIGH (ref 0–149)
VLDL Cholesterol Cal: 28 mg/dL (ref 5–40)

## 2022-02-25 ENCOUNTER — Telehealth: Payer: Self-pay

## 2022-02-25 ENCOUNTER — Other Ambulatory Visit: Payer: Self-pay

## 2022-02-25 DIAGNOSIS — Z800 Family history of malignant neoplasm of digestive organs: Secondary | ICD-10-CM

## 2022-02-25 DIAGNOSIS — Z86010 Personal history of colonic polyps: Secondary | ICD-10-CM

## 2022-02-25 MED ORDER — NA SULFATE-K SULFATE-MG SULF 17.5-3.13-1.6 GM/177ML PO SOLN: 1.0000 | Freq: Once | ORAL | 0 refills | Status: AC

## 2022-02-25 NOTE — Telephone Encounter (Signed)
Gastroenterology Pre-Procedure Review  Request Date: 03/16/22 Requesting Physician: Dr. Marius Ditch  PATIENT REVIEW QUESTIONS: The patient responded to the following health history questions as indicated:    Poor prep noted on previous colonoscopy performed by Dr. Bonna Gains on 08/05/20. Pt stated he was just unable to tolerate all the prep due to throwing it up.  1. Are you having any GI issues?No 2. Do you have a personal history of Polyps? yes (noted on 2017 colonoscopy performed by Dr. Tiffany Kocher) 3. Do you have a family history of Colon Cancer or Polyps? yes (father colon cancer) 4. Diabetes Mellitus? Yes. Stop dated noted on instructions for Semaglutide Tablets 03/09/22. Metformin 03/14/22 5. Joint replacements in the past 12 months?no 6. Major health problems in the past 3 months?no 7. Any artificial heart valves, MVP, or defibrillator?no    MEDICATIONS & ALLERGIES:    Patient reports the following regarding taking any anticoagulation/antiplatelet therapy:   Plavix, Coumadin, Eliquis, Xarelto, Lovenox, Pradaxa, Brilinta, or Effient? no Aspirin? no  Patient confirms/reports the following medications:  Current Outpatient Medications  Medication Sig Dispense Refill   albuterol (VENTOLIN HFA) 108 (90 Base) MCG/ACT inhaler Inhale 2 puffs into the lungs every 6 (six) hours as needed for wheezing or shortness of breath. 18 g 1   azelastine (ASTELIN) 0.1 % nasal spray Place 1 spray into both nostrils 2 (two) times daily. 30 mL 12   fluticasone (FLONASE) 50 MCG/ACT nasal spray Place 2 sprays into both nostrils daily. 16 g 12   glucose blood (ONE TOUCH ULTRA TEST) test strip USE TO TEST DAILY 100 each 4   hydrochlorothiazide (MICROZIDE) 12.5 MG capsule Take 1 capsule (12.5 mg total) by mouth daily. 90 capsule 1   losartan (COZAAR) 50 MG tablet Take 1 tablet (50 mg total) by mouth daily. 90 tablet 1   metFORMIN (GLUCOPHAGE) 500 MG tablet Take 1 tablet (500 mg total) by mouth daily with breakfast. 90  tablet 1   RABEprazole (ACIPHEX) 20 MG tablet Take 1 tablet (20 mg total) by mouth daily. 90 tablet 1   rosuvastatin (CRESTOR) 40 MG tablet Take 1 tablet (40 mg total) by mouth daily. 90 tablet 1   Semaglutide (RYBELSUS) 7 MG TABS Take 1 tablet (7 mg total) by mouth daily. 90 tablet 1   No current facility-administered medications for this visit.    Patient confirms/reports the following allergies:  Allergies  Allergen Reactions   Lisinopril Cough    No orders of the defined types were placed in this encounter.   AUTHORIZATION INFORMATION Primary Insurance: 1D#: Group #:  Secondary Insurance: 1D#: Group #:  SCHEDULE INFORMATION: Date: 03/16/22 Time: Location: [facility]

## 2022-03-09 ENCOUNTER — Ambulatory Visit (INDEPENDENT_AMBULATORY_CARE_PROVIDER_SITE_OTHER): Payer: BC Managed Care – PPO

## 2022-03-09 ENCOUNTER — Encounter: Payer: Self-pay | Admitting: Podiatry

## 2022-03-09 ENCOUNTER — Ambulatory Visit: Payer: BC Managed Care – PPO | Admitting: Podiatry

## 2022-03-09 DIAGNOSIS — M778 Other enthesopathies, not elsewhere classified: Secondary | ICD-10-CM

## 2022-03-09 MED ORDER — TRIAMCINOLONE ACETONIDE 40 MG/ML IJ SUSP
40.0000 mg | Freq: Once | INTRAMUSCULAR | Status: AC
  Administered 2022-03-09: 40 mg

## 2022-03-09 NOTE — Progress Notes (Signed)
Subjective:  Patient ID: [name], male    DOB: 28-Nov-1970,  MRN: [id number] HPI Chief Complaint  Patient presents with   Foot Pain    Plantar forefoot bilateral - aching x 6 months, feels swollen, sometimes needle like sensations, numbness, trying massage -no help   New Patient (Initial Visit)    52 y.o. male presents with the above complaint.   ROS: Denies fever chills nausea vomit muscle aches pains calf pain back pain chest pain shortness of breath.  Past Medical History:  Diagnosis Date   Allergy    Arthritis    Back pain    Diabetes mellitus without complication (HCC)    GERD (gastroesophageal reflux disease)    Hyperlipidemia    Hypertension    Past Surgical History:  Procedure Laterality Date   COLONOSCOPY WITH PROPOFOL N/A 10/25/2015   Procedure: COLONOSCOPY WITH PROPOFOL;  Surgeon: Manya Silvas, MD;  Location: [facility];  Service: Endoscopy;  Laterality: N/A;   COLONOSCOPY WITH PROPOFOL N/A 08/21/2020   Procedure: COLONOSCOPY WITH PROPOFOL;  Surgeon: Virgel Manifold, MD;  Location: [facility];  Service: Endoscopy;  Laterality: N/A;   ESOPHAGOGASTRODUODENOSCOPY (EGD) WITH PROPOFOL N/A 11/02/2019   Procedure: ESOPHAGOGASTRODUODENOSCOPY (EGD) WITH PROPOFOL;  Surgeon: Virgel Manifold, MD;  Location: [facility];  Service: Endoscopy;  Laterality: N/A;   ESOPHAGOGASTRODUODENOSCOPY (EGD) WITH PROPOFOL N/A 08/21/2020   Procedure: ESOPHAGOGASTRODUODENOSCOPY (EGD) WITH PROPOFOL;  Surgeon: Virgel Manifold, MD;  Location: [facility];  Service: Endoscopy;  Laterality: N/A;   FRACTURE SURGERY     HAND SURGERY Right 01/26/2010   fracture- 5 pins and reconstruction   HERNIA REPAIR  01/27/2000   Double   NASAL SINUS SURGERY  01/26/2006   Maxillary Sinus Removed   STERIOD INJECTION     ulcer on his larynx      Current Outpatient Medications:    Na Sulfate-K Sulfate-Mg Sulf 17.5-3.13-1.6 GM/177ML SOLN, See admin instructions. follow  package directions, Disp: , Rfl:    albuterol (VENTOLIN HFA) 108 (90 Base) MCG/ACT inhaler, Inhale 2 puffs into the lungs every 6 (six) hours as needed for wheezing or shortness of breath., Disp: 18 g, Rfl: 1   azelastine (ASTELIN) 0.1 % nasal spray, Place 1 spray into both nostrils 2 (two) times daily., Disp: 30 mL, Rfl: 12   fluticasone (FLONASE) 50 MCG/ACT nasal spray, Place 2 sprays into both nostrils daily., Disp: 16 g, Rfl: 12   glucose blood (ONE TOUCH ULTRA TEST) test strip, USE TO TEST DAILY, Disp: 100 each, Rfl: 4   hydrochlorothiazide (MICROZIDE) 12.5 MG capsule, Take 1 capsule (12.5 mg total) by mouth daily., Disp: 90 capsule, Rfl: 1   losartan (COZAAR) 50 MG tablet, Take 1 tablet (50 mg total) by mouth daily., Disp: 90 tablet, Rfl: 1   metFORMIN (GLUCOPHAGE) 500 MG tablet, Take 1 tablet (500 mg total) by mouth daily with breakfast., Disp: 90 tablet, Rfl: 1   RABEprazole (ACIPHEX) 20 MG tablet, Take 1 tablet (20 mg total) by mouth daily., Disp: 90 tablet, Rfl: 1   rosuvastatin (CRESTOR) 40 MG tablet, Take 1 tablet (40 mg total) by mouth daily., Disp: 90 tablet, Rfl: 1   Semaglutide (RYBELSUS) 7 MG TABS, Take 1 tablet (7 mg total) by mouth daily., Disp: 90 tablet, Rfl: 1  Allergies  Allergen Reactions   Lisinopril Cough   Review of Systems Objective:  There were no vitals filed for this visit.  General: Well developed, nourished, in no acute distress, alert and oriented x3  Dermatological: Skin is warm, dry and supple bilateral. Nails x 10 are well maintained; remaining integument appears unremarkable at this time. There are no open sores, no preulcerative lesions, no rash or signs of infection present.  Vascular: Dorsalis Pedis artery and Posterior Tibial artery pedal pulses are 2/4 bilateral with immedate capillary fill time. Pedal hair growth present. No varicosities and no lower extremity edema present bilateral.   Neruologic: Grossly intact via light touch bilateral.  Vibratory intact via tuning fork bilateral. Protective threshold with Semmes Wienstein monofilament intact to all pedal sites bilateral. Patellar and Achilles deep tendon reflexes 2+ bilateral. No Babinski or clonus noted bilateral.   Musculoskeletal: No gross boney pedal deformities bilateral. No pain, crepitus, or limitation noted with foot and ankle range of motion bilateral. Muscular strength 5/5 in all groups tested bilateral.  Pain on palpation second metatarsophalangeal joint bilateral.  Pain on end range of motion.  Gait: Unassisted, Nonantalgic.    Radiographs:  Radiographs taken today demonstrate osseously mature individual with good mineralization throughout.  No excessive osteoarthritic changes.  No acute findings in the area of question.  Assessment & Plan:   Assessment: Capsulitis second metatarsophalangeal joint bilateral.  Plan: Injected around the joints today second tarsal phalangeal joint with Kenalog and local anesthetic discussed appropriate shoe gear stretching exercise ice therapy sugar modifications select follow-up with him in a few weeks just to make sure he is improving.  May need to consider orthotics.     Tahjae Durr T. [place], [place]

## 2022-03-16 ENCOUNTER — Encounter: Payer: Self-pay | Admitting: Gastroenterology

## 2022-03-16 ENCOUNTER — Ambulatory Visit: Payer: BC Managed Care – PPO | Admitting: Anesthesiology

## 2022-03-16 ENCOUNTER — Encounter
Admission: RE | Disposition: A | Discharge status: Home / Self Care | Payer: Self-pay | Source: Home / Self Care | Attending: Gastroenterology

## 2022-03-16 ENCOUNTER — Ambulatory Visit
Admission: RE | Admit: 2022-03-16 | Discharge: 2022-03-16 | Disposition: A | Discharge status: Home / Self Care | Payer: BC Managed Care – PPO | Attending: Gastroenterology | Admitting: Gastroenterology

## 2022-03-16 DIAGNOSIS — K5790 Diverticulosis of intestine, part unspecified, without perforation or abscess without bleeding: Secondary | ICD-10-CM | POA: Diagnosis not present

## 2022-03-16 DIAGNOSIS — K219 Gastro-esophageal reflux disease without esophagitis: Secondary | ICD-10-CM | POA: Insufficient documentation

## 2022-03-16 DIAGNOSIS — I10 Essential (primary) hypertension: Secondary | ICD-10-CM | POA: Insufficient documentation

## 2022-03-16 DIAGNOSIS — Z7985 Long-term (current) use of injectable non-insulin antidiabetic drugs: Secondary | ICD-10-CM | POA: Insufficient documentation

## 2022-03-16 DIAGNOSIS — Z1211 Encounter for screening for malignant neoplasm of colon: Secondary | ICD-10-CM | POA: Diagnosis not present

## 2022-03-16 DIAGNOSIS — K5730 Diverticulosis of large intestine without perforation or abscess without bleeding: Secondary | ICD-10-CM | POA: Insufficient documentation

## 2022-03-16 DIAGNOSIS — Z87891 Personal history of nicotine dependence: Secondary | ICD-10-CM | POA: Insufficient documentation

## 2022-03-16 DIAGNOSIS — Z7984 Long term (current) use of oral hypoglycemic drugs: Secondary | ICD-10-CM | POA: Diagnosis not present

## 2022-03-16 DIAGNOSIS — Z86010 Personal history of colonic polyps: Secondary | ICD-10-CM

## 2022-03-16 DIAGNOSIS — E119 Type 2 diabetes mellitus without complications: Secondary | ICD-10-CM | POA: Insufficient documentation

## 2022-03-16 DIAGNOSIS — Z800 Family history of malignant neoplasm of digestive organs: Secondary | ICD-10-CM | POA: Diagnosis not present

## 2022-03-16 DIAGNOSIS — K644 Residual hemorrhoidal skin tags: Secondary | ICD-10-CM | POA: Insufficient documentation

## 2022-03-16 HISTORY — PX: COLONOSCOPY WITH PROPOFOL: SHX5780

## 2022-03-16 SURGERY — COLONOSCOPY WITH PROPOFOL
Anesthesia: General

## 2022-03-16 MED ORDER — PROPOFOL 1000 MG/100ML IV EMUL
INTRAVENOUS | Status: AC
  Filled 2022-03-16: qty 100

## 2022-03-16 MED ORDER — PROPOFOL 10 MG/ML IV BOLUS
INTRAVENOUS | Status: DC | PRN
  Administered 2022-03-16: 100 mg via INTRAVENOUS
  Administered 2022-03-16: 40 mg via INTRAVENOUS

## 2022-03-16 MED ORDER — SIMETHICONE 40 MG/0.6ML PO SUSP
ORAL | Status: DC | PRN
  Administered 2022-03-16: 60 mL

## 2022-03-16 MED ORDER — PROPOFOL 500 MG/50ML IV EMUL
INTRAVENOUS | Status: DC | PRN
  Administered 2022-03-16: 150 ug/kg/min via INTRAVENOUS

## 2022-03-16 MED ORDER — SODIUM CHLORIDE 0.9 % IV SOLN
INTRAVENOUS | Status: DC
  Administered 2022-03-16: 20 mL/h via INTRAVENOUS

## 2022-03-16 MED ORDER — LIDOCAINE 2% (20 MG/ML) 5 ML SYRINGE
INTRAMUSCULAR | Status: DC | PRN
  Administered 2022-03-16: 50 mg via INTRAVENOUS

## 2022-03-16 NOTE — Anesthesia Postprocedure Evaluation (Signed)
Anesthesia Post Note  Patient: CAP WERNICKE  Procedure(s) Performed: COLONOSCOPY WITH PROPOFOL  Patient location during evaluation: PACU Anesthesia Type: General Level of consciousness: awake and alert, oriented and patient cooperative Pain management: pain level controlled Vital Signs Assessment: post-procedure vital signs reviewed and stable Respiratory status: spontaneous breathing, nonlabored ventilation and respiratory function stable Cardiovascular status: blood pressure returned to baseline and stable Postop Assessment: adequate PO intake Anesthetic complications: no   No notable events documented.   Last Vitals:  Vitals:   03/16/22 1150 03/16/22 1211  BP:  128/82  Pulse: 95 68  Resp: 14 16  Temp:    SpO2: 97% 98%    Last Pain:  Vitals:   03/16/22 1211  TempSrc:   PainSc: 0-No pain                 Darrin Nipper

## 2022-03-16 NOTE — Anesthesia Preprocedure Evaluation (Addendum)
Anesthesia Evaluation  Patient identified by MRN, date of birth, ID band Patient awake    Reviewed: Allergy & Precautions, NPO status , Patient's Chart, lab work & pertinent test results  History of Anesthesia Complications Negative for: history of anesthetic complications  Airway Mallampati: I   Neck ROM: Full    Dental no notable dental hx.    Pulmonary former smoker (quit 2007)   Pulmonary exam normal breath sounds clear to auscultation       Cardiovascular hypertension, Normal cardiovascular exam Rhythm:Regular Rate:Normal     Neuro/Psych negative neurological ROS     GI/Hepatic ,GERD  ,,  Endo/Other  diabetes, Type 2    Renal/GU negative Renal ROS     Musculoskeletal  (+) Arthritis ,    Abdominal   Peds  Hematology negative hematology ROS (+)   Anesthesia Other Findings   Reproductive/Obstetrics                             Anesthesia Physical Anesthesia Plan  ASA: 2  Anesthesia Plan: General   Post-op Pain Management:    Induction: Intravenous  PONV Risk Score and Plan: 2 and Propofol infusion, TIVA and Treatment may vary due to age or medical condition  Airway Management Planned: Natural Airway  Additional Equipment:   Intra-op Plan:   Post-operative Plan:   Informed Consent: I have reviewed the patients History and Physical, chart, labs and discussed the procedure including the risks, benefits and alternatives for the proposed anesthesia with the patient or authorized representative who has indicated his/her understanding and acceptance.       Plan Discussed with: CRNA  Anesthesia Plan Comments: (LMA/GETA backup discussed.  Patient consented for risks of anesthesia including but not limited to:  - adverse reactions to medications - damage to eyes, teeth, lips or other oral mucosa - nerve damage due to positioning  - sore throat or hoarseness - damage to  heart, brain, nerves, lungs, other parts of body or loss of life  Informed patient about role of CRNA in peri- and intra-operative care.  Patient voiced understanding.)        Anesthesia Quick Evaluation

## 2022-03-16 NOTE — Op Note (Signed)
[facility] Patient Name: Stephen Mayo Procedure Date: 03/16/2022 11:17 AM MRN: [id number] Account #: [id number] Date of Birth: [birth year] Admit Type: Outpatient Age: 52 Room: [facility] ENDO ROOM 4 Gender: Male Note Status: Finalized Instrument Name: Jasper Riling [id number] Procedure:             Colonoscopy Indications:           Screening for colorectal malignant neoplasm,                         inadequate bowel prep on last colonoscopy (more recent                         than 10 years ago), Screening in patient at increased                         risk: Colorectal cancer in father before age 66, Last                         colonoscopy: July 2022 Providers:             Lin Landsman MD, MD Referring MD:          Valerie Roys (Referring MD) Medicines:             General Anesthesia Complications:         No immediate complications. Estimated blood loss: None. Procedure:             Pre-Anesthesia Assessment:                        - Prior to the procedure, a History and Physical was                         performed, and patient medications and allergies were                         reviewed. The patient is competent. The risks and                         benefits of the procedure and the sedation options and                         risks were discussed with the patient. All questions                         were answered and informed consent was obtained.                         Patient identification and proposed procedure were                         verified by the physician, the nurse, the                         anesthesiologist, the anesthetist and the technician                         in the pre-procedure area in the procedure room in the  endoscopy suite. Mental Status Examination: alert and                         oriented. Airway Examination: normal oropharyngeal                         airway and neck  mobility. Respiratory Examination:                         clear to auscultation. CV Examination: normal.                         Prophylactic Antibiotics: The patient does not require                         prophylactic antibiotics. Prior Anticoagulants: The                         patient has taken no anticoagulant or antiplatelet                         agents. ASA Grade Assessment: II - A patient with mild                         systemic disease. After reviewing the risks and                         benefits, the patient was deemed in satisfactory                         condition to undergo the procedure. The anesthesia                         plan was to use general anesthesia. Immediately prior                         to administration of medications, the patient was                         re-assessed for adequacy to receive sedatives. The                         heart rate, respiratory rate, oxygen saturations,                         blood pressure, adequacy of pulmonary ventilation, and                         response to care were monitored throughout the                         procedure. The physical status of the patient was                         re-assessed after the procedure.                        After obtaining informed consent, the colonoscope was  passed under direct vision. Throughout the procedure,                         the patient's blood pressure, pulse, and oxygen                         saturations were monitored continuously. The                         Colonoscope was introduced through the anus and                         advanced to the the cecum, identified by appendiceal                         orifice and ileocecal valve. The colonoscopy was                         performed without difficulty. The patient tolerated                         the procedure well. The quality of the bowel                         preparation was  evaluated using the BBPS [facility] Bowel                         Preparation Scale) with scores of: Right Colon = 3,                         Transverse Colon = 3 and Left Colon = 3 (entire mucosa                         seen well with no residual staining, small fragments                         of stool or opaque liquid). The total BBPS score                         equals 9. Anatomical landmarks were photographed. Findings:      The perianal and digital rectal examinations were normal. Pertinent       negatives include normal sphincter tone and no palpable rectal lesions.      Skin tags were found on perianal exam.      Many small-mouthed diverticula were found in the recto-sigmoid colon,       sigmoid colon, descending colon and transverse colon.      The retroflexed view of the distal rectum and anal verge was normal and       showed no anal or rectal abnormalities.      The exam was otherwise without abnormality. Impression:            - Perianal skin tags found on perianal exam.                        - Diverticulosis in the recto-sigmoid colon, in the  sigmoid colon, in the descending colon and in the                         transverse colon.                        - The distal rectum and anal verge are normal on                         retroflexion view.                        - The examination was otherwise normal.                        - No specimens collected. Recommendation:        - Discharge patient to home (with escort).                        - Resume previous diet today.                        - Continue present medications.                        - Repeat colonoscopy in 5 years for screening purposes. Procedure Code(s):     --- Professional ---                        [id number], Colorectal cancer screening; colonoscopy on                         individual at high risk Diagnosis Code(s):     --- Professional ---                        Z12.11,  Encounter for screening for malignant neoplasm                         of colon                        Z80.0, Family history of malignant neoplasm of                         digestive organs                        K64.4, Residual hemorrhoidal skin tags                        K57.30, Diverticulosis of large intestine without                         perforation or abscess without bleeding CPT copyright 2022 [employer]. All rights reserved. The codes documented in this report are preliminary and upon coder review may  be revised to meet current compliance requirements. Dr. Ulyess Mort Lin Landsman MD, MD 03/16/2022 11:49:33 AM This report has been signed electronically. Number of Addenda: 0 Note Initiated On: 03/16/2022 11:17 AM Scope Withdrawal Time: 0 hours 6 minutes 53 seconds  Total Procedure Duration: 0 hours 14 minutes  5 seconds  Estimated Blood Loss:  Estimated blood loss: none.      [facility]

## 2022-03-16 NOTE — H&P (Signed)
Stephen Darby, MD [address]  [place]  [place], Stephen [place] [address]  Main: [phone]  Fax: [phone] Pager: [phone]  Primary Care Physician:  Stephen Roys, DO Primary Gastroenterologist:  Dr. Cephas Mayo  Pre-Procedure History & Physical: HPI:  Stephen Mayo is a 52 y.o. male is here for an colonoscopy.   Past Medical History:  Diagnosis Date   Allergy    Arthritis    Back pain    Diabetes mellitus without complication (HCC)    GERD (gastroesophageal reflux disease)    Hyperlipidemia    Hypertension     Past Surgical History:  Procedure Laterality Date   COLONOSCOPY WITH PROPOFOL N/A 10/25/2015   Procedure: COLONOSCOPY WITH PROPOFOL;  Surgeon: Stephen Silvas, MD;  Location: [place];  Service: Endoscopy;  Laterality: N/A;   COLONOSCOPY WITH PROPOFOL N/A 08/21/2020   Procedure: COLONOSCOPY WITH PROPOFOL;  Surgeon: Stephen Manifold, MD;  Location: [facility];  Service: Endoscopy;  Laterality: N/A;   ESOPHAGOGASTRODUODENOSCOPY (EGD) WITH PROPOFOL N/A 11/02/2019   Procedure: ESOPHAGOGASTRODUODENOSCOPY (EGD) WITH PROPOFOL;  Surgeon: Stephen Manifold, MD;  Location: [facility];  Service: Endoscopy;  Laterality: N/A;   ESOPHAGOGASTRODUODENOSCOPY (EGD) WITH PROPOFOL N/A 08/21/2020   Procedure: ESOPHAGOGASTRODUODENOSCOPY (EGD) WITH PROPOFOL;  Surgeon: Stephen Manifold, MD;  Location: [facility];  Service: Endoscopy;  Laterality: N/A;   FRACTURE SURGERY     HAND SURGERY Right 01/26/2010   fracture- 5 pins and reconstruction   HERNIA REPAIR  01/27/2000   Double   NASAL SINUS SURGERY  01/26/2006   Maxillary Sinus Removed   STERIOD INJECTION     ulcer on his larynx      Prior to Admission medications   Medication Sig Start Date End Date Taking? Authorizing Provider  albuterol (VENTOLIN HFA) 108 (90 Base) MCG/ACT inhaler Inhale 2 puffs into the lungs every 6 (six) hours as needed for wheezing or shortness of breath. 02/20/22   Yes Mayo, Stephen P, DO  azelastine (ASTELIN) 0.1 % nasal spray Place 1 spray into both nostrils 2 (two) times daily. 12/10/21  Yes Mayo, Stephen P, DO  fluticasone (FLONASE) 50 MCG/ACT nasal spray Place 2 sprays into both nostrils daily. 12/10/21  Yes Mayo, Stephen P, DO  glucose blood (ONE TOUCH ULTRA TEST) test strip USE TO TEST DAILY 12/15/16  Yes Mayo, Stephen P, DO  hydrochlorothiazide (MICROZIDE) 12.5 MG capsule Take 1 capsule (12.5 mg total) by mouth daily. 02/20/22  Yes Mayo, Stephen P, DO  losartan (COZAAR) 50 MG tablet Take 1 tablet (50 mg total) by mouth daily. 02/20/22  Yes Mayo, Stephen P, DO  metFORMIN (GLUCOPHAGE) 500 MG tablet Take 1 tablet (500 mg total) by mouth daily with breakfast. 02/20/22  Yes Mayo, Stephen P, DO  Na Sulfate-K Sulfate-Mg Sulf 17.5-3.13-1.6 GM/177ML SOLN See admin instructions. follow package directions 02/25/22  Yes [provider]  RABEprazole (ACIPHEX) 20 MG tablet Take 1 tablet (20 mg total) by mouth daily. 02/20/22  Yes Mayo, Stephen P, DO  rosuvastatin (CRESTOR) 40 MG tablet Take 1 tablet (40 mg total) by mouth daily. 02/20/22  Yes Mayo, Stephen P, DO  Semaglutide (RYBELSUS) 7 MG TABS Take 1 tablet (7 mg total) by mouth daily. 02/20/22  Yes Mayo, Stephen P, DO    Allergies as of 02/25/2022 - Review Complete 02/20/2022  Allergen Reaction Noted   Lisinopril Cough 05/30/2015    Family History  Problem Relation Age of Onset   Cancer Mother  Lung   Arthritis Mother    Cancer Father        colon   Cancer Maternal Grandmother        breast, brain and colon   Lymphoma Paternal Grandmother    Cancer Paternal Grandfather        lung and throat    Social History   Socioeconomic History   Marital status: Married    Spouse name: Not on file   Number of children: Not on file   Years of education: Not on file   Highest education level: Not on file  Occupational History   Not on file  Tobacco Use   Smoking status: Former     Types: Cigarettes    Quit date: 06/25/2005    Years since quitting: 16.7   Smokeless tobacco: Former  Scientific laboratory technician Use: Never used  Substance and Sexual Activity   Alcohol use: Yes   Drug use: No   Sexual activity: Yes  Other Topics Concern   Not on file  Social History Narrative   Not on file   Social Determinants of Health   Financial Resource Strain: Not on file  Food Insecurity: Not on file  Transportation Needs: Not on file  Physical Activity: Not on file  Stress: Not on file  Social Connections: Not on file  Intimate Partner Violence: Not on file    Review of Systems: See HPI, otherwise negative ROS  Physical Exam: BP (!) 142/80   Pulse 72   Temp (!) 96.6 F (35.9 C) (Axillary)   Resp 20   Ht 5' 10.5" (1.791 m)   Wt 88.3 kg   SpO2 100%   BMI 27.53 kg/m  General:   Alert,  pleasant and cooperative in NAD Head:  Normocephalic and atraumatic. Neck:  Supple; no masses or thyromegaly. Lungs:  Clear throughout to auscultation.    Heart:  Regular rate and rhythm. Abdomen:  Soft, nontender and nondistended. Normal bowel sounds, without guarding, and without rebound.   Neurologic:  Alert and  oriented x4;  grossly normal neurologically.  Impression/Plan: Stephen Mayo is here for an colonoscopy to be performed for family h/o colon cancer  Risks, benefits, limitations, and alternatives regarding  colonoscopy have been reviewed with the patient.  Questions have been answered.  All parties agreeable.   Stephen Sear, MD  03/16/2022, 11:18 AM

## 2022-03-16 NOTE — Transfer of Care (Signed)
Immediate Anesthesia Transfer of Care Note  Patient: Stephen Mayo  Procedure(s) Performed: COLONOSCOPY WITH PROPOFOL  Patient Location: Endoscopy Unit  Anesthesia Type:General  Level of Consciousness: drowsy  Airway & Oxygen Therapy: Patient Spontanous Breathing  Post-op Assessment: Report given to RN and Post -op Vital signs reviewed and stable  Post vital signs: Reviewed and stable  Last Vitals:  Vitals Value Taken Time  BP 116/71 03/16/22 1151  Temp    Pulse 79 03/16/22 1151  Resp 14 03/16/22 1151  SpO2 97 % 03/16/22 1151  Vitals shown include unvalidated device data.  Last Pain:  Vitals:   03/16/22 1150  TempSrc:   PainSc: 0-No pain         Complications: No notable events documented.

## 2022-05-22 ENCOUNTER — Ambulatory Visit (INDEPENDENT_AMBULATORY_CARE_PROVIDER_SITE_OTHER): Payer: BC Managed Care – PPO

## 2022-05-22 DIAGNOSIS — Z23 Encounter for immunization: Secondary | ICD-10-CM

## 2022-07-09 DIAGNOSIS — H2513 Age-related nuclear cataract, bilateral: Secondary | ICD-10-CM | POA: Diagnosis not present

## 2022-07-09 DIAGNOSIS — E119 Type 2 diabetes mellitus without complications: Secondary | ICD-10-CM | POA: Diagnosis not present

## 2022-07-09 DIAGNOSIS — H524 Presbyopia: Secondary | ICD-10-CM | POA: Diagnosis not present

## 2022-07-09 LAB — HM DIABETES EYE EXAM

## 2022-08-21 ENCOUNTER — Encounter: Payer: Self-pay | Admitting: Family Medicine

## 2022-08-21 ENCOUNTER — Ambulatory Visit (INDEPENDENT_AMBULATORY_CARE_PROVIDER_SITE_OTHER): Payer: BC Managed Care – PPO | Admitting: Family Medicine

## 2022-08-21 VITALS — BP 134/72 | HR 68 | Temp 98.8°F | Ht 71.0 in | Wt 194.0 lb

## 2022-08-21 DIAGNOSIS — Z0000 Encounter for general adult medical examination without abnormal findings: Secondary | ICD-10-CM

## 2022-08-21 DIAGNOSIS — I10 Essential (primary) hypertension: Secondary | ICD-10-CM

## 2022-08-21 DIAGNOSIS — E119 Type 2 diabetes mellitus without complications: Secondary | ICD-10-CM | POA: Diagnosis not present

## 2022-08-21 DIAGNOSIS — E782 Mixed hyperlipidemia: Secondary | ICD-10-CM

## 2022-08-21 LAB — URINALYSIS, ROUTINE W REFLEX MICROSCOPIC
Bilirubin, UA: NEGATIVE
Glucose, UA: NEGATIVE
Leukocytes,UA: NEGATIVE
Nitrite, UA: NEGATIVE
Protein,UA: NEGATIVE
RBC, UA: NEGATIVE
Specific Gravity, UA: 1.015 (ref 1.005–1.030)
Urobilinogen, Ur: 1 mg/dL (ref 0.2–1.0)
pH, UA: 6.5 (ref 5.0–7.5)

## 2022-08-21 LAB — BAYER DCA HB A1C WAIVED: HB A1C (BAYER DCA - WAIVED): 6.3 % — ABNORMAL HIGH (ref 4.8–5.6)

## 2022-08-21 LAB — MICROALBUMIN, URINE WAIVED
Creatinine, Urine Waived: 100 mg/dL (ref 10–300)
Microalb, Ur Waived: 10 mg/L (ref 0–19)
Microalb/Creat Ratio: <30 mg/g (ref <30)

## 2022-08-21 MED ORDER — ROSUVASTATIN CALCIUM 40 MG PO TABS: 40.0000 mg | ORAL_TABLET | Freq: Every day | ORAL | 1 refills | Status: DC

## 2022-08-21 MED ORDER — ALBUTEROL SULFATE HFA 108 (90 BASE) MCG/ACT IN AERS: 2.0000 | INHALATION_SPRAY | Freq: Four times a day (QID) | RESPIRATORY_TRACT | 1 refills | Status: DC | PRN

## 2022-08-21 MED ORDER — LOSARTAN POTASSIUM 50 MG PO TABS: 50.0000 mg | ORAL_TABLET | Freq: Every day | ORAL | 1 refills | Status: DC

## 2022-08-21 MED ORDER — RYBELSUS 7 MG PO TABS: 7.0000 mg | ORAL_TABLET | Freq: Every day | ORAL | 1 refills | Status: DC

## 2022-08-21 MED ORDER — AZELASTINE HCL 0.1 % NA SOLN: 1.0000 | Freq: Two times a day (BID) | NASAL | 12 refills | Status: DC

## 2022-08-21 MED ORDER — HYDROCHLOROTHIAZIDE 12.5 MG PO CAPS: 12.5000 mg | ORAL_CAPSULE | Freq: Every day | ORAL | 1 refills | Status: DC

## 2022-08-21 MED ORDER — METFORMIN HCL 500 MG PO TABS: 500.0000 mg | ORAL_TABLET | Freq: Every day | ORAL | 1 refills | Status: DC

## 2022-08-21 MED ORDER — RABEPRAZOLE SODIUM 20 MG PO TBEC: 20.0000 mg | DELAYED_RELEASE_TABLET | Freq: Every day | ORAL | 1 refills | Status: DC

## 2022-08-21 MED ORDER — FLUTICASONE PROPIONATE 50 MCG/ACT NA SUSP: 2.0000 | Freq: Every day | NASAL | 12 refills | Status: DC

## 2022-08-21 NOTE — Assessment & Plan Note (Signed)
Under good control on current regimen. Continue current regimen. Continue to monitor. Call with any concerns. Refills given. Labs drawn today.   

## 2022-08-21 NOTE — Assessment & Plan Note (Signed)
Doing great with A1c of 6.3. Continue current regimen. Continue to monitor. Call with any concerns. Refills given.

## 2022-08-21 NOTE — Progress Notes (Signed)
BP 134/72   Pulse 68   Temp 98.8 F (37.1 C) (Oral)   Ht 5\' 11"  (1.803 m)   Wt 194 lb (88 kg)   SpO2 99%   BMI 27.06 kg/m    Subjective:    Patient ID: Stephen Mayo, male    DOB: July 03, 1970, 52 y.o.   MRN: [id number]  HPI: Stephen Mayo is a 52 y.o. male presenting on 08/21/2022 for comprehensive medical examination. Current medical complaints include:  DIABETES Hypoglycemic episodes:no Polydipsia/polyuria: no Visual disturbance: no Chest pain: no Paresthesias: no Glucose Monitoring: yes  Accucheck frequency: Daily Taking Insulin?: no Blood Pressure Monitoring: not checking Retinal Examination: Up to Date Foot Exam: Up to Date Diabetic Education: Completed Pneumovax: Up to Date Influenza: Up to Date Aspirin: no  HYPERTENSION / HYPERLIPIDEMIA Satisfied with current treatment? yes Duration of hypertension: chronic BP monitoring frequency: not checking BP medication side effects: no Past BP meds: losartan, hydrochlorothiazide Duration of hyperlipidemia: chronic Cholesterol medication side effects: no Cholesterol supplements: none Past cholesterol medications: crestor Medication compliance: excellent compliance Aspirin: no Recent stressors: no Recurrent headaches: no Visual changes: no Palpitations: no Dyspnea: no Chest pain: no Lower extremity edema: no Dizzy/lightheaded: no  Interim Problems from his last visit: no  Depression Screen done today and results listed below:     08/21/2022    3:13 PM 02/20/2022    3:10 PM 11/18/2021    4:01 PM 08/19/2021    3:14 PM 05/19/2021    3:36 PM  Depression screen PHQ 2/9  Decreased Interest 0 0 0 0 0  Down, Depressed, Hopeless 0 0 0 0 0  PHQ - 2 Score 0 0 0 0 0  Altered sleeping 0 0 0 0 0  Tired, decreased energy 0 0 0 0 0  Change in appetite 0 0 0 0 0  Feeling bad or failure about yourself  0 0 0 0 0  Trouble concentrating 0 0 0 0 0  Moving slowly or fidgety/restless 0 0 0 0 0  Suicidal thoughts 0 0 0 0  0  PHQ-9 Score 0 0 0 0 0  Difficult doing work/chores Not difficult at all Not difficult at all  Not difficult at all      Past Medical History:  Past Medical History:  Diagnosis Date   Allergy    Arthritis    Back pain    Diabetes mellitus without complication (HCC)    GERD (gastroesophageal reflux disease)    Hyperlipidemia    Hypertension     Surgical History:  Past Surgical History:  Procedure Laterality Date   COLONOSCOPY WITH PROPOFOL N/A 10/25/2015   Procedure: COLONOSCOPY WITH PROPOFOL;  Surgeon: Scot Jun, MD;  Location: [facility];  Service: Endoscopy;  Laterality: N/A;   COLONOSCOPY WITH PROPOFOL N/A 08/21/2020   Procedure: COLONOSCOPY WITH PROPOFOL;  Surgeon: Pasty Spillers, MD;  Location: [facility];  Service: Endoscopy;  Laterality: N/A;   COLONOSCOPY WITH PROPOFOL N/A 03/16/2022   Procedure: COLONOSCOPY WITH PROPOFOL;  Surgeon: Toney Reil, MD;  Location: [facility] END[facility]COPY;  Service: Gastroenterology;  Laterality: N/A;   ESOPHAGOGASTRODUODENOSCOPY (EGD) WITH PROPOFOL N/A 11/02/2019   Procedure: ESOPHAGOGASTRODUODENOSCOPY (EGD) WITH PROPOFOL;  Surgeon: Pasty Spillers, MD;  Location: [facility];  Service: Endoscopy;  Laterality: N/A;   ESOPHAGOGASTRODUODENOSCOPY (EGD) WITH PROPOFOL N/A 08/21/2020   Procedure: ESOPHAGOGASTRODUODENOSCOPY (EGD) WITH PROPOFOL;  Surgeon: Pasty Spillers, MD;  Location: [facility]COPY;  Service: Endoscopy;  Laterality: N/A;   FRACTURE SURGERY  HAND SURGERY Right 01/26/2010   fracture- 5 pins and reconstruction   HERNIA REPAIR  01/27/2000   Double   NASAL SINUS SURGERY  01/26/2006   Maxillary Sinus Removed   STERIOD INJECTION     ulcer on his larynx      Medications:  Current Outpatient Medications on File Prior to Visit  Medication Sig   glucose blood (ONE TOUCH ULTRA TEST) test strip USE TO TEST DAILY   Na Sulfate-K Sulfate-Mg Sulf 17.5-3.13-1.6 GM/177ML SOLN See admin instructions. follow  package directions   No current facility-administered medications on file prior to visit.    Allergies:  Allergies  Allergen Reactions   Lisinopril Cough    Social History:  Social History   Socioeconomic History   Marital status: Married    Spouse name: Not on file   Number of children: Not on file   Years of education: Not on file   Highest education level: Not on file  Occupational History   Not on file  Tobacco Use   Smoking status: Former    Current packs/day: 0.00    Types: Cigarettes    Quit date: 06/25/2005    Years since quitting: 17.1   Smokeless tobacco: Former  Building services engineer status: Never Used  Substance and Sexual Activity   Alcohol use: Yes   Drug use: No   Sexual activity: Yes  Other Topics Concern   Not on file  Social History Narrative   Not on file   Social Determinants of Health   Financial Resource Strain: Not on file  Food Insecurity: Not on file  Transportation Needs: Not on file  Physical Activity: Not on file  Stress: Not on file  Social Connections: Not on file  Intimate Partner Violence: Not on file   Social History   Tobacco Use  Smoking Status Former   Current packs/day: 0.00   Types: Cigarettes   Quit date: 06/25/2005   Years since quitting: 17.1  Smokeless Tobacco Former   Social History   Substance and Sexual Activity  Alcohol Use Yes    Family History:  Family History  Problem Relation Age of Onset   Cancer Mother        Lung   Arthritis Mother    Cancer Father        colon   Cancer Maternal Grandmother        breast, brain and colon   Lymphoma Paternal Grandmother    Cancer Paternal Grandfather        lung and throat    Past medical history, surgical history, medications, allergies, family history and social history reviewed with patient today and changes made to appropriate areas of the chart.   Review of Systems  Constitutional: Negative.   HENT: Negative.    Eyes: Negative.   Respiratory:  Negative.    Cardiovascular: Negative.   Gastrointestinal: Negative.   Genitourinary: Negative.   Musculoskeletal: Negative.   Skin: Negative.   Neurological: Negative.   Endo/Heme/Allergies: Negative.   Psychiatric/Behavioral: Negative.     All other ROS negative except what is listed above and in the HPI.      Objective:    BP 134/72   Pulse 68   Temp 98.8 F (37.1 C) (Oral)   Ht 5\' 11"  (1.803 m)   Wt 194 lb (88 kg)   SpO2 99%   BMI 27.06 kg/m   Wt Readings from Last 3 Encounters:  08/21/22 194 lb (88 kg)  03/16/22 194  lb 9.6 oz (88.3 kg)  02/20/22 205 lb 8 oz (93.2 kg)    Physical Exam Vitals and nursing note reviewed.  Constitutional:      General: He is not in acute distress.    Appearance: Normal appearance. He is not ill-appearing, toxic-appearing or diaphoretic.  HENT:     Head: Normocephalic and atraumatic.     Right Ear: Tympanic membrane, ear canal and external ear normal. There is no impacted cerumen.     Left Ear: Tympanic membrane, ear canal and external ear normal. There is no impacted cerumen.     Nose: Nose normal. No congestion or rhinorrhea.     Mouth/Throat:     Mouth: Mucous membranes are moist.     Pharynx: Oropharynx is clear. No oropharyngeal exudate or posterior oropharyngeal erythema.  Eyes:     General: No scleral icterus.       Right eye: No discharge.        Left eye: No discharge.     Extraocular Movements: Extraocular movements intact.     Conjunctiva/sclera: Conjunctivae normal.     Pupils: Pupils are equal, round, and reactive to light.  Neck:     Vascular: No carotid bruit.  Cardiovascular:     Rate and Rhythm: Normal rate and regular rhythm.     Pulses: Normal pulses.     Heart sounds: No murmur heard.    No friction rub. No gallop.  Pulmonary:     Effort: Pulmonary effort is normal. No respiratory distress.     Breath sounds: Normal breath sounds. No stridor. No wheezing, rhonchi or rales.  Chest:     Chest wall: No  tenderness.  Abdominal:     General: Abdomen is flat. Bowel sounds are normal. There is no distension.     Palpations: Abdomen is soft. There is no mass.     Tenderness: There is no abdominal tenderness. There is no right CVA tenderness, left CVA tenderness, guarding or rebound.     Hernia: No hernia is present.  Genitourinary:    Comments: Genital exam deferred with shared decision making Musculoskeletal:        General: No swelling, tenderness, deformity or signs of injury.     Cervical back: Normal range of motion and neck supple. No rigidity. No muscular tenderness.     Right lower leg: No edema.     Left lower leg: No edema.  Lymphadenopathy:     Cervical: No cervical adenopathy.  Skin:    General: Skin is warm and dry.     Capillary Refill: Capillary refill takes less than 2 seconds.     Coloration: Skin is not jaundiced or pale.     Findings: No bruising, erythema, lesion or rash.  Neurological:     General: No focal deficit present.     Mental Status: He is alert and oriented to person, place, and time.     Cranial Nerves: No cranial nerve deficit.     Sensory: No sensory deficit.     Motor: No weakness.     Coordination: Coordination normal.     Gait: Gait normal.     Deep Tendon Reflexes: Reflexes normal.  Psychiatric:        Mood and Affect: Mood normal.        Behavior: Behavior normal.        Thought Content: Thought content normal.        Judgment: Judgment normal.     Results for orders placed or  performed in visit on 07/13/22  HM DIABETES EYE EXAM  Result Value Ref Range   HM Diabetic Eye Exam Retinopathy (A) No Retinopathy      Assessment & Plan:   Problem List Items Addressed This Visit       Cardiovascular and Mediastinum   HTN (hypertension)    Under good control on current regimen. Continue current regimen. Continue to monitor. Call with any concerns. Refills given. Labs drawn today.        Relevant Medications   hydrochlorothiazide  (MICROZIDE) 12.5 MG capsule   losartan (COZAAR) 50 MG tablet   rosuvastatin (CRESTOR) 40 MG tablet   Other Relevant Orders   Comprehensive metabolic panel   Microalbumin, Urine Waived     Endocrine   Diabetes mellitus type 2, diet-controlled (HCC)    Doing great with A1c of 6.3. Continue current regimen. Continue to monitor. Call with any concerns. Refills given.       Relevant Medications   losartan (COZAAR) 50 MG tablet   metFORMIN (GLUCOPHAGE) 500 MG tablet   rosuvastatin (CRESTOR) 40 MG tablet   Semaglutide (RYBELSUS) 7 MG TABS   Other Relevant Orders   Comprehensive metabolic panel   Microalbumin, Urine Waived   Bayer DCA Hb A1c Waived     Other   HLD (hyperlipidemia)    Under good control on current regimen. Continue current regimen. Continue to monitor. Call with any concerns. Refills given. Labs drawn today.       Relevant Medications   hydrochlorothiazide (MICROZIDE) 12.5 MG capsule   losartan (COZAAR) 50 MG tablet   rosuvastatin (CRESTOR) 40 MG tablet   Other Relevant Orders   Comprehensive metabolic panel   Lipid Panel w/o Chol/HDL Ratio   Other Visit Diagnoses     Routine general medical examination at a health care facility    -  Primary   Vaccines up to date. screening labs checked today. Colonoscopy up to date. Continue diet and exercise. Call with any concerns.   Relevant Orders   Comprehensive metabolic panel   CBC with Differential/Platelet   Lipid Panel w/o Chol/HDL Ratio   PSA   TSH   Urinalysis, Routine w reflex microscopic        LABORATORY TESTING:  Health maintenance labs ordered today as discussed above.   The natural history of prostate cancer and ongoing controversy regarding screening and potential treatment outcomes of prostate cancer has been discussed with the patient. The meaning of a false positive PSA and a false negative PSA has been discussed. He indicates understanding of the limitations of this screening test and wishes to  proceed with screening PSA testing.   IMMUNIZATIONS:   - Tdap: Tetanus vaccination status reviewed: last tetanus booster within 10 years. - Influenza: Postponed to flu season - Pneumovax: Up to date - Prevnar: Not applicable - COVID: Up to date - HPV: Not applicable - Shingrix vaccine: Up to date  SCREENING: - Colonoscopy: Up to date  Discussed with patient purpose of the colonoscopy is to detect colon cancer at curable precancerous or early stages   PATIENT COUNSELING:    Sexuality: Discussed sexually transmitted diseases, partner selection, use of condoms, avoidance of unintended pregnancy  and contraceptive alternatives.   Advised to avoid cigarette smoking.  I discussed with the patient that most people either abstain from alcohol or drink within safe limits (<=14/week and <=4 drinks/occasion for males, <=7/weeks and <= 3 drinks/occasion for females) and that the risk for alcohol disorders and other health effects  rises proportionally with the number of drinks per week and how often a drinker exceeds daily limits.  Discussed cessation/primary prevention of drug use and availability of treatment for abuse.   Diet: Encouraged to adjust caloric intake to maintain  or achieve ideal body weight, to reduce intake of dietary saturated fat and total fat, to limit sodium intake by avoiding high sodium foods and not adding table salt, and to maintain adequate dietary potassium and calcium preferably from fresh fruits, vegetables, and low-fat dairy products.    stressed the importance of regular exercise  Injury prevention: Discussed safety belts, safety helmets, smoke detector, smoking near bedding or upholstery.   Dental health: Discussed importance of regular tooth brushing, flossing, and dental visits.   Follow up plan: NEXT PREVENTATIVE PHYSICAL DUE IN 1 YEAR. Return in about 6 months (around 02/21/2023).

## 2022-10-02 ENCOUNTER — Other Ambulatory Visit: Payer: Self-pay | Admitting: Family Medicine

## 2022-10-02 NOTE — Telephone Encounter (Signed)
Requested Prescriptions  Refused Prescriptions Disp Refills   RYBELSUS 7 MG TABS [Pharmacy Med Name: RYBELSUS TABS 30'S 7MG ] 90 tablet 3    Sig: TAKE 1 TABLET DAILY     Off-Protocol Failed - 10/02/2022 11:12 AM      Failed - Medication not assigned to a protocol, review manually.      Passed - Valid encounter within last 12 months    Recent Outpatient Visits           1 month ago Routine general medical examination at a health care facility   [facility], [place] P, DO   7 months ago Diabetes mellitus type 2, diet-controlled (HCC)   [place] [facility] [place], Megan P, DO   10 months ago Diabetes mellitus type 2, diet-controlled (HCC)   [place] [facility] [place], Megan P, DO   1 year ago Routine general medical examination at a health care facility   [facility] [place], [place] P, DO   1 year ago Diabetes mellitus type 2, diet-controlled [facility])   [place] [facility] Dorcas Carrow, DO       Future Appointments             In 4 months Laural Benes, Oralia Rud, DO [place] [facility]

## 2023-01-10 ENCOUNTER — Other Ambulatory Visit: Payer: Self-pay | Admitting: Family Medicine

## 2023-01-11 NOTE — Telephone Encounter (Signed)
Requested medication (s) are due for refill today: No  Requested medication (s) are on the active medication list: yes    Last refill: 08/21/22  #90 1 refill  Future visit scheduled yes 02/22/23  Notes to clinic:Off protocol,please review. Thank you  Requested Prescriptions  Pending Prescriptions Disp Refills   RYBELSUS 7 MG TABS [Pharmacy Med Name: RYBELSUS TABS 30'S 7MG ] 90 tablet 3    Sig: TAKE 1 TABLET DAILY     Off-Protocol Failed - 01/11/2023  3:03 PM      Failed - Medication not assigned to a protocol, review manually.      Passed - Valid encounter within last 12 months    Recent Outpatient Visits           4 months ago Routine general medical examination at a health care facility   [facility], [place] P, DO   10 months ago Diabetes mellitus type 2, diet-controlled (HCC)   [facility] [place], Megan P, DO   1 year ago Diabetes mellitus type 2, diet-controlled (HCC)   [facility] [place], Megan P, DO   1 year ago Routine general medical examination at a health care facility   [facility] [place], [place] P, DO   1 year ago Diabetes mellitus type 2, diet-controlled [facility])   [place] [facility] Dorcas Carrow, DO       Future Appointments             In 1 month Johnson, Oralia Rud, DO [place] [facility], [facility]

## 2023-02-22 ENCOUNTER — Encounter: Payer: Self-pay | Admitting: Family Medicine

## 2023-02-22 ENCOUNTER — Ambulatory Visit: Payer: BC Managed Care – PPO | Admitting: Family Medicine

## 2023-02-22 VITALS — BP 136/76 | HR 81 | Temp 98.5°F | Wt 196.6 lb

## 2023-02-22 DIAGNOSIS — E782 Mixed hyperlipidemia: Secondary | ICD-10-CM

## 2023-02-22 DIAGNOSIS — K2100 Gastro-esophageal reflux disease with esophagitis, without bleeding: Secondary | ICD-10-CM | POA: Diagnosis not present

## 2023-02-22 DIAGNOSIS — Z7984 Long term (current) use of oral hypoglycemic drugs: Secondary | ICD-10-CM

## 2023-02-22 DIAGNOSIS — E119 Type 2 diabetes mellitus without complications: Secondary | ICD-10-CM | POA: Diagnosis not present

## 2023-02-22 DIAGNOSIS — I10 Essential (primary) hypertension: Secondary | ICD-10-CM

## 2023-02-22 LAB — BAYER DCA HB A1C WAIVED: HB A1C (BAYER DCA - WAIVED): 5.9 % — ABNORMAL HIGH (ref 4.8–5.6)

## 2023-02-22 MED ORDER — RABEPRAZOLE SODIUM 20 MG PO TBEC: 20.0000 mg | DELAYED_RELEASE_TABLET | Freq: Every day | ORAL | 1 refills | Status: DC

## 2023-02-22 MED ORDER — ROSUVASTATIN CALCIUM 40 MG PO TABS: 40.0000 mg | ORAL_TABLET | Freq: Every day | ORAL | 1 refills | Status: DC

## 2023-02-22 MED ORDER — METFORMIN HCL 500 MG PO TABS: 500.0000 mg | ORAL_TABLET | Freq: Every day | ORAL | 1 refills | Status: DC

## 2023-02-22 MED ORDER — RYBELSUS 7 MG PO TABS: 1.0000 | ORAL_TABLET | Freq: Every day | ORAL | 1 refills | Status: DC

## 2023-02-22 MED ORDER — HYDROCHLOROTHIAZIDE 12.5 MG PO CAPS: 12.5000 mg | ORAL_CAPSULE | Freq: Every day | ORAL | 1 refills | Status: DC

## 2023-02-22 MED ORDER — LOSARTAN POTASSIUM 50 MG PO TABS: 50.0000 mg | ORAL_TABLET | Freq: Every day | ORAL | 1 refills | Status: DC

## 2023-02-22 NOTE — Assessment & Plan Note (Signed)
Doing great with A1c of 5.9. Continue to monitor. Refills given. Call with any concerns.

## 2023-02-22 NOTE — Assessment & Plan Note (Signed)
Under good control on current regimen. Continue current regimen. Continue to monitor. Call with any concerns. Refills given. Labs drawn today.

## 2023-02-22 NOTE — Progress Notes (Signed)
BP 136/76   Pulse 81   Temp 98.5 F (36.9 C) (Oral)   Wt 196 lb 9.6 oz (89.2 kg)   SpO2 96%   BMI 27.42 kg/m    Subjective:    Patient ID: Stephen Mayo, male    DOB: 05/16/1970, 53 y.o.   MRN: [id number]  HPI: Stephen Mayo is a 53 y.o. male  Chief Complaint  Patient presents with   Hypertension   Hyperlipidemia   Diabetes   DIABETES Hypoglycemic episodes:no Polydipsia/polyuria: no Visual disturbance: no Chest pain: no Paresthesias: no Glucose Monitoring: no  Accucheck frequency: Not Checking Taking Insulin?: no Blood Pressure Monitoring: daily Retinal Examination: Up to Date Foot Exam: Up to Date Diabetic Education: Completed Pneumovax: Up to Date Influenza: Up to Date Aspirin: no  HYPERTENSION / HYPERLIPIDEMIA Satisfied with current treatment? yes Duration of hypertension: chronic BP monitoring frequency: daily BP medication side effects: no Past BP meds: hydrochlorothiazide, losartan Duration of hyperlipidemia: chronic Cholesterol medication side effects: no Cholesterol supplements: none Past cholesterol medications: crestor Medication compliance: excellent compliance Aspirin: no Recent stressors: no Recurrent headaches: no Visual changes: no Palpitations: no Dyspnea: no Chest pain: no Lower extremity edema: no Dizzy/lightheaded: no  Relevant past medical, surgical, family and social history reviewed and updated as indicated. Interim medical history since our last visit reviewed. Allergies and medications reviewed and updated.  Review of Systems  Constitutional: Negative.   Respiratory: Negative.    Cardiovascular: Negative.   Gastrointestinal: Negative.   Musculoskeletal: Negative.   Psychiatric/Behavioral: Negative.      Per HPI unless specifically indicated above     Objective:    BP 136/76   Pulse 81   Temp 98.5 F (36.9 C) (Oral)   Wt 196 lb 9.6 oz (89.2 kg)   SpO2 96%   BMI 27.42 kg/m   Wt Readings from Last 3  Encounters:  02/22/23 196 lb 9.6 oz (89.2 kg)  08/21/22 194 lb (88 kg)  03/16/22 194 lb 9.6 oz (88.3 kg)    Physical Exam Vitals and nursing note reviewed.  Constitutional:      General: He is not in acute distress.    Appearance: Normal appearance. He is not ill-appearing, toxic-appearing or diaphoretic.  HENT:     Head: Normocephalic and atraumatic.     Right Ear: External ear normal.     Left Ear: External ear normal.     Nose: Nose normal.     Mouth/Throat:     Mouth: Mucous membranes are moist.     Pharynx: Oropharynx is clear.  Eyes:     General: No scleral icterus.       Right eye: No discharge.        Left eye: No discharge.     Extraocular Movements: Extraocular movements intact.     Conjunctiva/sclera: Conjunctivae normal.     Pupils: Pupils are equal, round, and reactive to light.  Cardiovascular:     Rate and Rhythm: Normal rate and regular rhythm.     Pulses: Normal pulses.     Heart sounds: Normal heart sounds. No murmur heard.    No friction rub. No gallop.  Pulmonary:     Effort: Pulmonary effort is normal. No respiratory distress.     Breath sounds: Normal breath sounds. No stridor. No wheezing, rhonchi or rales.  Chest:     Chest wall: No tenderness.  Musculoskeletal:        General: Normal range of motion.     Cervical back:  Normal range of motion and neck supple.  Skin:    General: Skin is warm and dry.     Capillary Refill: Capillary refill takes less than 2 seconds.     Coloration: Skin is not jaundiced or pale.     Findings: No bruising, erythema, lesion or rash.  Neurological:     General: No focal deficit present.     Mental Status: He is alert and oriented to person, place, and time. Mental status is at baseline.  Psychiatric:        Mood and Affect: Mood normal.        Behavior: Behavior normal.        Thought Content: Thought content normal.        Judgment: Judgment normal.     Results for orders placed or performed in visit on  08/21/22  Urinalysis, Routine w reflex microscopic   Collection Time: 08/21/22  3:19 PM  Result Value Ref Range   Specific Gravity, UA 1.015 1.005 - 1.030   pH, UA 6.5 5.0 - 7.5   Color, UA Yellow Yellow   Appearance Ur Clear Clear   Leukocytes,UA Negative Negative   Protein,UA Negative Negative/Trace   Glucose, UA Negative Negative   Ketones, UA 1+ (A) Negative   RBC, UA Negative Negative   Bilirubin, UA Negative Negative   Urobilinogen, Ur 1.0 0.2 - 1.0 mg/dL   Nitrite, UA Negative Negative  Microalbumin, Urine Waived   Collection Time: 08/21/22  3:19 PM  Result Value Ref Range   Microalb, Ur Waived 10 0 - 19 mg/L   Creatinine, Urine Waived 100 10 - 300 mg/dL   Microalb/Creat Ratio <30 <30 mg/g  Bayer DCA Hb A1c Waived   Collection Time: 08/21/22  3:19 PM  Result Value Ref Range   HB A1C (BAYER DCA - WAIVED) 6.3 (H) 4.8 - 5.6 %  Comprehensive metabolic panel   Collection Time: 08/21/22  3:20 PM  Result Value Ref Range   Glucose 101 (H) 70 - 99 mg/dL   BUN 14 6 - 24 mg/dL   Creatinine, Ser 1.61 0.76 - 1.27 mg/dL   eGFR 96 >09 UE/AVW/0.98   BUN/Creatinine Ratio 15 9 - 20   Sodium 136 134 - 144 mmol/L   Potassium 3.9 3.5 - 5.2 mmol/L   Chloride 95 (L) 96 - 106 mmol/L   CO2 24 20 - 29 mmol/L   Calcium 10.1 8.7 - 10.2 mg/dL   Total Protein 7.5 6.0 - 8.5 g/dL   Albumin 4.7 4.1 - 5.1 g/dL   Globulin, Total 2.8 1.5 - 4.5 g/dL   Bilirubin Total 0.6 0.0 - 1.2 mg/dL   Alkaline Phosphatase 89 44 - 121 IU/L   AST 23 0 - 40 IU/L   ALT 22 0 - 44 IU/L  CBC with Differential/Platelet   Collection Time: 08/21/22  3:20 PM  Result Value Ref Range   WBC 8.6 3.4 - 10.8 x10E3/uL   RBC 5.15 4.14 - 5.80 x10E6/uL   Hemoglobin 15.7 13.0 - 17.7 g/dL   Hematocrit 11.9 14.7 - 51.0 %   MCV 90 79 - 97 fL   MCH 30.5 26.6 - 33.0 pg   MCHC 33.9 31.5 - 35.7 g/dL   RDW 82.9 56.2 - 13.0 %   Platelets 234 150 - 450 x10E3/uL   Neutrophils 67 Not Estab. %   Lymphs 25 Not Estab. %   Monocytes 6  Not Estab. %   Eos 1 Not Estab. %   Basos  1 Not Estab. %   Neutrophils Absolute 5.8 1.4 - 7.0 x10E3/uL   Lymphocytes Absolute 2.1 0.7 - 3.1 x10E3/uL   Monocytes Absolute 0.5 0.1 - 0.9 x10E3/uL   EOS (ABSOLUTE) 0.1 0.0 - 0.4 x10E3/uL   Basophils Absolute 0.1 0.0 - 0.2 x10E3/uL   Immature Granulocytes 0 Not Estab. %   Immature Grans (Abs) 0.0 0.0 - 0.1 x10E3/uL  Lipid Panel w/o Chol/HDL Ratio   Collection Time: 08/21/22  3:20 PM  Result Value Ref Range   Cholesterol, Total 163 100 - 199 mg/dL   Triglycerides 161 (H) 0 - 149 mg/dL   HDL 67 >09 mg/dL   VLDL Cholesterol Cal 25 5 - 40 mg/dL   LDL Chol Calc (NIH) 71 0 - 99 mg/dL  PSA   Collection Time: 08/21/22  3:20 PM  Result Value Ref Range   Prostate Specific Ag, Serum 0.2 0.0 - 4.0 ng/mL  TSH   Collection Time: 08/21/22  3:20 PM  Result Value Ref Range   TSH 2.450 0.450 - 4.500 uIU/mL      Assessment & Plan:   Problem List Items Addressed This Visit       Cardiovascular and Mediastinum   HTN (hypertension) - Primary   Under good control on current regimen. Continue current regimen. Continue to monitor. Call with any concerns. Refills given.  Labs drawn today.       Relevant Medications   hydrochlorothiazide (MICROZIDE) 12.5 MG capsule   losartan (COZAAR) 50 MG tablet   rosuvastatin (CRESTOR) 40 MG tablet   Other Relevant Orders   CBC with Differential/Platelet   Comprehensive metabolic panel     Digestive   Gastroesophageal reflux disease with esophagitis   Under good control on current regimen. Continue current regimen. Continue to monitor. Call with any concerns. Refills given.  Labs drawn today.         Endocrine   Diabetes mellitus type 2, diet-controlled (HCC)   Doing great with A1c of 5.9. Continue to monitor. Refills given. Call with any concerns.       Relevant Medications   losartan (COZAAR) 50 MG tablet   metFORMIN (GLUCOPHAGE) 500 MG tablet   rosuvastatin (CRESTOR) 40 MG tablet   Semaglutide  (RYBELSUS) 7 MG TABS   Other Relevant Orders   Bayer DCA Hb A1c Waived   CBC with Differential/Platelet   Comprehensive metabolic panel   Pneumococcal conjugate vaccine 20-valent (Prevnar 20)     Other   HLD (hyperlipidemia)   Under good control on current regimen. Continue current regimen. Continue to monitor. Call with any concerns. Refills given.  Labs drawn today.       Relevant Medications   hydrochlorothiazide (MICROZIDE) 12.5 MG capsule   losartan (COZAAR) 50 MG tablet   rosuvastatin (CRESTOR) 40 MG tablet   Other Relevant Orders   CBC with Differential/Platelet   Lipid Panel w/o Chol/HDL Ratio   Comprehensive metabolic panel     Follow up plan: Return in about 6 months (around 08/22/2023) for physical.

## 2023-02-23 LAB — COMPREHENSIVE METABOLIC PANEL
ALT: 22 [IU]/L (ref 0–44)
AST: 23 [IU]/L (ref 0–40)
Albumin: 4.7 g/dL (ref 3.8–4.9)
Alkaline Phosphatase: 108 [IU]/L (ref 44–121)
BUN/Creatinine Ratio: 13 (ref 9–20)
BUN: 11 mg/dL (ref 6–24)
Bilirubin Total: 0.4 mg/dL (ref 0.0–1.2)
CO2: 28 mmol/L (ref 20–29)
Calcium: 10 mg/dL (ref 8.7–10.2)
Chloride: 99 mmol/L (ref 96–106)
Creatinine, Ser: 0.85 mg/dL (ref 0.76–1.27)
Globulin, Total: 2.5 g/dL (ref 1.5–4.5)
Glucose: 112 mg/dL — ABNORMAL HIGH (ref 70–99)
Potassium: 4.3 mmol/L (ref 3.5–5.2)
Sodium: 142 mmol/L (ref 134–144)
Total Protein: 7.2 g/dL (ref 6.0–8.5)
eGFR: 105 mL/min/{1.73_m2} (ref >59)

## 2023-02-23 LAB — CBC WITH DIFFERENTIAL/PLATELET
Basophils Absolute: 0 10*3/uL (ref 0.0–0.2)
Basos: 1 %
EOS (ABSOLUTE): 0.1 10*3/uL (ref 0.0–0.4)
Eos: 1 %
Hematocrit: 44.9 % (ref 37.5–51.0)
Hemoglobin: 15.1 g/dL (ref 13.0–17.7)
Immature Grans (Abs): 0 10*3/uL (ref 0.0–0.1)
Immature Granulocytes: 0 %
Lymphocytes Absolute: 1.9 10*3/uL (ref 0.7–3.1)
Lymphs: 26 %
MCH: 30.9 pg (ref 26.6–33.0)
MCHC: 33.6 g/dL (ref 31.5–35.7)
MCV: 92 fL (ref 79–97)
Monocytes Absolute: 0.5 10*3/uL (ref 0.1–0.9)
Monocytes: 7 %
Neutrophils Absolute: 4.7 10*3/uL (ref 1.4–7.0)
Neutrophils: 65 %
Platelets: 230 10*3/uL (ref 150–450)
RBC: 4.89 x10E6/uL (ref 4.14–5.80)
RDW: 12.2 % (ref 11.6–15.4)
WBC: 7.2 10*3/uL (ref 3.4–10.8)

## 2023-02-23 LAB — LIPID PANEL W/O CHOL/HDL RATIO
Cholesterol, Total: 160 mg/dL (ref 100–199)
HDL: 65 mg/dL (ref >39)
LDL Chol Calc (NIH): 76 mg/dL (ref 0–99)
Triglycerides: 107 mg/dL (ref 0–149)
VLDL Cholesterol Cal: 19 mg/dL (ref 5–40)

## 2023-02-26 ENCOUNTER — Encounter: Payer: Self-pay | Admitting: Family Medicine

## 2023-08-23 ENCOUNTER — Other Ambulatory Visit: Payer: Self-pay | Admitting: Family Medicine

## 2023-08-24 NOTE — Telephone Encounter (Signed)
 Requested Prescriptions  Pending Prescriptions Disp Refills   Azelastine  HCl 137 MCG/SPRAY SOLN [Pharmacy Med Name: AZELASTINE  NASAL SPRAY 0.1% 137MCG] 30 mL 1    Sig: USE 1 SPRAY IN EACH NOSTRIL TWICE A DAY     Ear, Nose, and Throat: Nasal Preparations - Antiallergy Failed - 08/24/2023  2:50 PM      Failed - Valid encounter within last 12 months    Recent Outpatient Visits   None     Future Appointments             In 2 days Stephen Duwaine SQUIBB, DO [facility]

## 2023-08-26 ENCOUNTER — Encounter: Payer: Self-pay | Admitting: Family Medicine

## 2023-08-26 ENCOUNTER — Ambulatory Visit (INDEPENDENT_AMBULATORY_CARE_PROVIDER_SITE_OTHER): Payer: Self-pay | Admitting: Family Medicine

## 2023-08-26 VITALS — BP 132/85 | HR 79 | Temp 97.8°F | Ht 70.5 in | Wt 188.0 lb

## 2023-08-26 DIAGNOSIS — Z0000 Encounter for general adult medical examination without abnormal findings: Secondary | ICD-10-CM | POA: Diagnosis not present

## 2023-08-26 DIAGNOSIS — E782 Mixed hyperlipidemia: Secondary | ICD-10-CM

## 2023-08-26 DIAGNOSIS — K2100 Gastro-esophageal reflux disease with esophagitis, without bleeding: Secondary | ICD-10-CM

## 2023-08-26 DIAGNOSIS — I10 Essential (primary) hypertension: Secondary | ICD-10-CM

## 2023-08-26 DIAGNOSIS — E119 Type 2 diabetes mellitus without complications: Secondary | ICD-10-CM | POA: Diagnosis not present

## 2023-08-26 LAB — BAYER DCA HB A1C WAIVED: HB A1C (BAYER DCA - WAIVED): 6.1 % — ABNORMAL HIGH (ref 4.8–5.6)

## 2023-08-26 LAB — MICROALBUMIN, URINE WAIVED
Creatinine, Urine Waived: 200 mg/dL (ref 10–300)
Microalb, Ur Waived: 10 mg/L (ref 0–19)
Microalb/Creat Ratio: <30 mg/g (ref <30)

## 2023-08-26 MED ORDER — RYBELSUS 7 MG PO TABS: 1.0000 | ORAL_TABLET | Freq: Every day | ORAL | 1 refills | Status: DC

## 2023-08-26 MED ORDER — FLUTICASONE PROPIONATE 50 MCG/ACT NA SUSP: 2.0000 | Freq: Every day | NASAL | 12 refills | Status: DC

## 2023-08-26 MED ORDER — ALBUTEROL SULFATE HFA 108 (90 BASE) MCG/ACT IN AERS: 2.0000 | INHALATION_SPRAY | Freq: Four times a day (QID) | RESPIRATORY_TRACT | 1 refills | Status: DC | PRN

## 2023-08-26 MED ORDER — METFORMIN HCL 500 MG PO TABS: 500.0000 mg | ORAL_TABLET | Freq: Every day | ORAL | 1 refills | Status: DC

## 2023-08-26 MED ORDER — HYDROCHLOROTHIAZIDE 12.5 MG PO CAPS: 12.5000 mg | ORAL_CAPSULE | Freq: Every day | ORAL | 1 refills | Status: DC

## 2023-08-26 MED ORDER — LOSARTAN POTASSIUM 50 MG PO TABS: 50.0000 mg | ORAL_TABLET | Freq: Every day | ORAL | 1 refills | Status: DC

## 2023-08-26 MED ORDER — ROSUVASTATIN CALCIUM 40 MG PO TABS: 40.0000 mg | ORAL_TABLET | Freq: Every day | ORAL | 1 refills | Status: DC

## 2023-08-26 MED ORDER — RABEPRAZOLE SODIUM 20 MG PO TBEC: 20.0000 mg | DELAYED_RELEASE_TABLET | Freq: Every day | ORAL | 1 refills | Status: DC

## 2023-08-26 NOTE — Assessment & Plan Note (Signed)
 Under good control on current regimen. Continue current regimen. Continue to monitor. Call with any concerns. Refills given. Labs drawn today.

## 2023-08-26 NOTE — Progress Notes (Signed)
 BP 132/85   Pulse 79   Temp 97.8 F (36.6 C) (Oral)   Ht 5' 10.5 (1.791 m)   Wt 188 lb (85.3 kg)   SpO2 96%   BMI 26.59 kg/m    Subjective:    Patient ID: Stephen Mayo, male    DOB: [birth year], 53 y.o.   MRN: [id number]  HPI: Stephen Mayo is a 53 y.o. male presenting on 08/26/2023 for comprehensive medical examination. Current medical complaints include:  DIABETES Hypoglycemic episodes:no Polydipsia/polyuria: no Visual disturbance: no Chest pain: no Paresthesias: no Glucose Monitoring: no  Accucheck frequency: Not Checking Taking Insulin?: no Blood Pressure Monitoring: not checking Retinal Examination: Not up to Date Foot Exam: Up to Date Diabetic Education: Completed Pneumovax: Up to Date Influenza: Up to Date Aspirin: no  HYPERTENSION / HYPERLIPIDEMIA Satisfied with current treatment? yes Duration of hypertension: chronic BP monitoring frequency: not checking BP medication side effects: no Past BP meds: losartan , HCTZ Duration of hyperlipidemia: chronic Cholesterol medication side effects: no Cholesterol supplements: none Past cholesterol medications: crestor  Medication compliance: excellent compliance Aspirin: no Recent stressors: no Recurrent headaches: no Visual changes: no Palpitations: no Dyspnea: no Chest pain: no Lower extremity edema: no Dizzy/lightheaded: no   He currently lives with: wife and kids Interim Problems from his last visit: no  Depression Screen done today and results listed below:     02/22/2023    1:50 PM 08/21/2022    3:13 PM 02/20/2022    3:10 PM 11/18/2021    4:01 PM 08/19/2021    3:14 PM  Depression screen PHQ 2/9  Decreased Interest 0 0 0 0 0  Down, Depressed, Hopeless 0 0 0 0 0  PHQ - 2 Score 0 0 0 0 0  Altered sleeping 0 0 0 0 0  Tired, decreased energy 0 0 0 0 0  Change in appetite 0 0 0 0 0  Feeling bad or failure about yourself  0 0 0 0 0  Trouble concentrating 0 0 0 0 0  Moving slowly or  fidgety/restless 0 0 0 0 0  Suicidal thoughts 0 0 0 0 0  PHQ-9 Score 0 0 0 0 0  Difficult doing work/chores Not difficult at all Not difficult at all Not difficult at all  Not difficult at all     Past Medical History:  Past Medical History:  Diagnosis Date   Allergy    Arthritis    Back pain    Diabetes mellitus without complication (HCC)    GERD (gastroesophageal reflux disease)    Hyperlipidemia    Hypertension     Surgical History:  Past Surgical History:  Procedure Laterality Date   COLONOSCOPY WITH PROPOFOL  N/A 10/25/2015   Procedure: COLONOSCOPY WITH PROPOFOL ;  Surgeon: Lamar ONEIDA Holmes, MD;  [facility]COPY;  Service: Endoscopy;  Laterality: N/A;   COLONOSCOPY WITH PROPOFOL  N/A 08/21/2020   Procedure: COLONOSCOPY WITH PROPOFOL ;  Surgeon: Janalyn Keene NOVAK, MD;  Location: [facility] ENDOSCOPY;  Service: Endoscopy;  Laterality: N/A;   COLONOSCOPY WITH PROPOFOL  N/A 03/16/2022   Procedure: COLONOSCOPY WITH PROPOFOL ;  Surgeon: Unk Corinn Skiff, MD;  Location: [facility] ENDOSCOPY;  Service: Gastroenterology;  Laterality: N/A;   ESOPHAGOGASTRODUODENOSCOPY (EGD) WITH PROPOFOL  N/A 11/02/2019   Procedure: ESOPHAGOGASTRODUODENOSCOPY (EGD) WITH PROPOFOL ;  Surgeon: Janalyn Keene NOVAK, MD;  Location: [facility] ENDOSCOPY;  Service: Endoscopy;  Laterality: N/A;   ESOPHAGOGASTRODUODENOSCOPY (EGD) WITH PROPOFOL  N/A 08/21/2020   Procedure: ESOPHAGOGASTRODUODENOSCOPY (EGD) WITH PROPOFOL ;  Surgeon: Janalyn Keene NOVAK, MD;  Location: [facility]  ENDOSCOPY;  Service: Endoscopy;  Laterality: N/A;   FRACTURE SURGERY     HAND SURGERY Right 01/26/2010   fracture- 5 pins and reconstruction   HERNIA REPAIR  01/27/2000   Double   NASAL SINUS SURGERY  01/26/2006   Maxillary Sinus Removed   STERIOD INJECTION     ulcer on his larynx      Medications:  Current Outpatient Medications on File Prior to Visit  Medication Sig   Azelastine  HCl 137 MCG/SPRAY SOLN USE 1 SPRAY IN EACH NOSTRIL TWICE A DAY   No  current facility-administered medications on file prior to visit.    Allergies:  Allergies  Allergen Reactions   Lisinopril  Cough    Social History:  Social History   Socioeconomic History   Marital status: Married    Spouse name: Not on file   Number of children: Not on file   Years of education: Not on file   Highest education level: GED or equivalent  Occupational History   Not on file  Tobacco Use   Smoking status: Former    Current packs/day: 0.00    Types: Cigarettes    Quit date: 06/25/2005    Years since quitting: 18.1   Smokeless tobacco: Former  Building services engineer status: Never Used  Substance and Sexual Activity   Alcohol use: Yes    Alcohol/week: 10.0 standard drinks of alcohol    Types: 10 Cans of beer per week   Drug use: No   Sexual activity: Yes  Other Topics Concern   Not on file  Social History Narrative   Not on file   Social Drivers of Health   Financial Resource Strain: Low Risk  (08/23/2023)   Overall Financial Resource Strain (CARDIA)    Difficulty of Paying Living Expenses: Not hard at all  Food Insecurity: No Food Insecurity (08/23/2023)   Hunger Vital Sign    Worried About Running Out of Food in the Last Year: Never true    Ran Out of Food in the Last Year: Never true  Transportation Needs: No Transportation Needs (08/23/2023)   PRAPARE - Administrator, Civil Service (Medical): No    Lack of Transportation (Non-Medical): No  Physical Activity: Insufficiently Active (08/23/2023)   Exercise Vital Sign    Days of Exercise per Week: 1 day    Minutes of Exercise per Session: 10 min  Stress: No Stress Concern Present (08/23/2023)   [employer] of Occupational Health - Occupational Stress Questionnaire    Feeling of Stress: Not at all  Social Connections: Socially Isolated (08/23/2023)   Social Connection and Isolation Panel    Frequency of Communication with Friends and Family: Once a week    Frequency of Social Gatherings  with Friends and Family: Once a week    Attends Religious Services: Never    Database administrator or Organizations: No    Attends Engineer, structural: Not on file    Marital Status: Married  Catering manager Violence: Not At Risk (08/26/2023)   Humiliation, Afraid, Rape, and Kick questionnaire    Fear of Current or Ex-Partner: No    Emotionally Abused: No    Physically Abused: No    Sexually Abused: No   Social History   Tobacco Use  Smoking Status Former   Current packs/day: 0.00   Types: Cigarettes   Quit date: 06/25/2005   Years since quitting: 18.1  Smokeless Tobacco Former   Social History   Substance and  Sexual Activity  Alcohol Use Yes   Alcohol/week: 10.0 standard drinks of alcohol   Types: 10 Cans of beer per week    Family History:  Family History  Problem Relation Age of Onset   Cancer Mother        Lung   Arthritis Mother    Cancer Father        colon   Cancer Maternal Grandmother        breast, brain and colon   Lymphoma Paternal Grandmother    Cancer Paternal Grandfather        lung and throat    Past medical history, surgical history, medications, allergies, family history and social history reviewed with patient today and changes made to appropriate areas of the chart.   Review of Systems  Constitutional: Negative.   HENT: Negative.    Eyes: Negative.   Respiratory: Negative.    Cardiovascular: Negative.   Gastrointestinal: Negative.   Genitourinary: Negative.   Musculoskeletal: Negative.   Skin: Negative.    All other ROS negative except what is listed above and in the HPI.      Objective:    BP 132/85   Pulse 79   Temp 97.8 F (36.6 C) (Oral)   Ht 5' 10.5 (1.791 m)   Wt 188 lb (85.3 kg)   SpO2 96%   BMI 26.59 kg/m   Wt Readings from Last 3 Encounters:  08/26/23 188 lb (85.3 kg)  02/22/23 196 lb 9.6 oz (89.2 kg)  08/21/22 194 lb (88 kg)    Physical Exam Vitals and nursing note reviewed.  Constitutional:       General: He is not in acute distress.    Appearance: Normal appearance. He is obese. He is not ill-appearing, toxic-appearing or diaphoretic.  HENT:     Head: Normocephalic and atraumatic.     Right Ear: Tympanic membrane, ear canal and external ear normal. There is no impacted cerumen.     Left Ear: Tympanic membrane, ear canal and external ear normal. There is no impacted cerumen.     Nose: Nose normal. No congestion or rhinorrhea.     Mouth/Throat:     Mouth: Mucous membranes are moist.     Pharynx: Oropharynx is clear. No oropharyngeal exudate or posterior oropharyngeal erythema.  Eyes:     General: No scleral icterus.       Right eye: No discharge.        Left eye: No discharge.     Extraocular Movements: Extraocular movements intact.     Conjunctiva/sclera: Conjunctivae normal.     Pupils: Pupils are equal, round, and reactive to light.  Neck:     Vascular: No carotid bruit.  Cardiovascular:     Rate and Rhythm: Normal rate and regular rhythm.     Pulses: Normal pulses.     Heart sounds: No murmur heard.    No friction rub. No gallop.  Pulmonary:     Effort: Pulmonary effort is normal. No respiratory distress.     Breath sounds: Normal breath sounds. No stridor. No wheezing, rhonchi or rales.  Chest:     Chest wall: No tenderness.  Abdominal:     General: Abdomen is flat. Bowel sounds are normal. There is no distension.     Palpations: Abdomen is soft. There is no mass.     Tenderness: There is no abdominal tenderness. There is no right CVA tenderness, left CVA tenderness, guarding or rebound.     Hernia: No hernia is  present.  Genitourinary:    Comments: Genital exam deferred with shared decision making Musculoskeletal:        General: No swelling, tenderness, deformity or signs of injury.     Cervical back: Normal range of motion and neck supple. No rigidity. No muscular tenderness.     Right lower leg: No edema.     Left lower leg: No edema.  Lymphadenopathy:      Cervical: No cervical adenopathy.  Skin:    General: Skin is warm and dry.     Capillary Refill: Capillary refill takes less than 2 seconds.     Coloration: Skin is not jaundiced or pale.     Findings: No bruising, erythema, lesion or rash.  Neurological:     General: No focal deficit present.     Mental Status: He is alert and oriented to person, place, and time.     Cranial Nerves: No cranial nerve deficit.     Sensory: No sensory deficit.     Motor: No weakness.     Coordination: Coordination normal.     Gait: Gait normal.     Deep Tendon Reflexes: Reflexes normal.  Psychiatric:        Mood and Affect: Mood normal.        Behavior: Behavior normal.        Thought Content: Thought content normal.        Judgment: Judgment normal.     Results for orders placed or performed in visit on 02/22/23  Bayer DCA Hb A1c Waived   Collection Time: 02/22/23  1:51 PM  Result Value Ref Range   HB A1C (BAYER DCA - WAIVED) 5.9 (H) 4.8 - 5.6 %  CBC with Differential/Platelet   Collection Time: 02/22/23  1:52 PM  Result Value Ref Range   WBC 7.2 3.4 - 10.8 x10E3/uL   RBC 4.89 4.14 - 5.80 x10E6/uL   Hemoglobin 15.1 13.0 - 17.7 g/dL   Hematocrit 55.0 62.4 - 51.0 %   MCV 92 79 - 97 fL   MCH 30.9 26.6 - 33.0 pg   MCHC 33.6 31.5 - 35.7 g/dL   RDW 87.7 88.3 - 84.5 %   Platelets 230 150 - 450 x10E3/uL   Neutrophils 65 Not Estab. %   Lymphs 26 Not Estab. %   Monocytes 7 Not Estab. %   Eos 1 Not Estab. %   Basos 1 Not Estab. %   Neutrophils Absolute 4.7 1.4 - 7.0 x10E3/uL   Lymphocytes Absolute 1.9 0.7 - 3.1 x10E3/uL   Monocytes Absolute 0.5 0.1 - 0.9 x10E3/uL   EOS (ABSOLUTE) 0.1 0.0 - 0.4 x10E3/uL   Basophils Absolute 0.0 0.0 - 0.2 x10E3/uL   Immature Granulocytes 0 Not Estab. %   Immature Grans (Abs) 0.0 0.0 - 0.1 x10E3/uL  Lipid Panel w/o Chol/HDL Ratio   Collection Time: 02/22/23  1:52 PM  Result Value Ref Range   Cholesterol, Total 160 100 - 199 mg/dL   Triglycerides 892 0 - 149  mg/dL   HDL 65 >60 mg/dL   VLDL Cholesterol Cal 19 5 - 40 mg/dL   LDL Chol Calc (NIH) 76 0 - 99 mg/dL  Comprehensive metabolic panel   Collection Time: 02/22/23  1:52 PM  Result Value Ref Range   Glucose 112 (H) 70 - 99 mg/dL   BUN 11 6 - 24 mg/dL   Creatinine, Ser 9.14 0.76 - 1.27 mg/dL   eGFR 894 >40 fO/fpw/8.26   BUN/Creatinine Ratio 13 9 - 20  Sodium 142 134 - 144 mmol/L   Potassium 4.3 3.5 - 5.2 mmol/L   Chloride 99 96 - 106 mmol/L   CO2 28 20 - 29 mmol/L   Calcium  10.0 8.7 - 10.2 mg/dL   Total Protein 7.2 6.0 - 8.5 g/dL   Albumin 4.7 3.8 - 4.9 g/dL   Globulin, Total 2.5 1.5 - 4.5 g/dL   Bilirubin Total 0.4 0.0 - 1.2 mg/dL   Alkaline Phosphatase 108 44 - 121 IU/L   AST 23 0 - 40 IU/L   ALT 22 0 - 44 IU/L      Assessment & Plan:   Problem List Items Addressed This Visit       Cardiovascular and Mediastinum   HTN (hypertension)   Under good control on current regimen. Continue current regimen. Continue to monitor. Call with any concerns. Refills given. Labs drawn today.       Relevant Medications   hydrochlorothiazide  (MICROZIDE ) 12.5 MG capsule   losartan  (COZAAR ) 50 MG tablet   rosuvastatin  (CRESTOR ) 40 MG tablet   Other Relevant Orders   Microalbumin, Urine Waived     Digestive   Gastroesophageal reflux disease with esophagitis   Under good control on current regimen. Continue current regimen. Continue to monitor. Call with any concerns. Refills given. Labs drawn today.         Endocrine   Diabetes mellitus type 2, diet-controlled (HCC)   Doing well with A1c of 6.1. Continue current regimen. Continue to monitor. Call with any concerns.       Relevant Medications   metFORMIN  (GLUCOPHAGE ) 500 MG tablet   losartan  (COZAAR ) 50 MG tablet   rosuvastatin  (CRESTOR ) 40 MG tablet   Semaglutide  (RYBELSUS ) 7 MG TABS   Other Relevant Orders   Bayer DCA Hb A1c Waived   Microalbumin, Urine Waived     Other   HLD (hyperlipidemia)   Under good control on current  regimen. Continue current regimen. Continue to monitor. Call with any concerns. Refills given. Labs drawn today.        Relevant Medications   hydrochlorothiazide  (MICROZIDE ) 12.5 MG capsule   losartan  (COZAAR ) 50 MG tablet   rosuvastatin  (CRESTOR ) 40 MG tablet   Other Visit Diagnoses       Routine general medical examination at a health care facility    -  Primary   Vaccines up to date. Screening labs checked today. Colonoscopy up to date. Continue diet and exercise. Call with any concerns.   Relevant Orders   Comprehensive metabolic panel with GFR   CBC with Differential/Platelet   Lipid Panel w/o Chol/HDL Ratio   PSA   TSH   Bayer DCA Hb A1c Waived   Microalbumin, Urine Waived   Hepatitis B surface antibody,quantitative         LABORATORY TESTING:  Health maintenance labs ordered today as discussed above.   The natural history of prostate cancer and ongoing controversy regarding screening and potential treatment outcomes of prostate cancer has been discussed with the patient. The meaning of a false positive PSA and a false negative PSA has been discussed. He indicates understanding of the limitations of this screening test and wishes to proceed with screening PSA testing.   IMMUNIZATIONS:   - Tdap: Tetanus vaccination status reviewed: last tetanus booster within 10 years. - Influenza: Postponed to flu season - Pneumovax: Up to date - Prevnar: Not applicable - COVID: Refused - HPV: Not applicable - Shingrix  vaccine: Up to date  SCREENING: - Colonoscopy: Up to date  Discussed with patient purpose of the colonoscopy is to detect colon cancer at curable precancerous or early stages   PATIENT COUNSELING:    Sexuality: Discussed sexually transmitted diseases, partner selection, use of condoms, avoidance of unintended pregnancy  and contraceptive alternatives.   Advised to avoid cigarette smoking.  I discussed with the patient that most people either abstain from  alcohol or drink within safe limits (<=14/week and <=4 drinks/occasion for males, <=7/weeks and <= 3 drinks/occasion for females) and that the risk for alcohol disorders and other health effects rises proportionally with the number of drinks per week and how often a drinker exceeds daily limits.  Discussed cessation/primary prevention of drug use and availability of treatment for abuse.   Diet: Encouraged to adjust caloric intake to maintain  or achieve ideal body weight, to reduce intake of dietary saturated fat and total fat, to limit sodium intake by avoiding high sodium foods and not adding table salt, and to maintain adequate dietary potassium and calcium  preferably from fresh fruits, vegetables, and low-fat dairy products.    stressed the importance of regular exercise  Injury prevention: Discussed safety belts, safety helmets, smoke detector, smoking near bedding or upholstery.   Dental health: Discussed importance of regular tooth brushing, flossing, and dental visits.   Follow up plan: NEXT PREVENTATIVE PHYSICAL DUE IN 1 YEAR. Return in about 6 months (around 02/26/2024).

## 2023-08-26 NOTE — Assessment & Plan Note (Signed)
Doing well with A1c of 6.1. Continue current regimen. Continue to monitor. Call with any concerns.  

## 2023-08-27 LAB — TSH: TSH: 1.39 u[IU]/mL (ref 0.450–4.500)

## 2023-08-27 LAB — CBC WITH DIFFERENTIAL/PLATELET
Basophils Absolute: 0 x10E3/uL (ref 0.0–0.2)
Basos: 1 %
EOS (ABSOLUTE): 0.1 x10E3/uL (ref 0.0–0.4)
Eos: 1 %
Hematocrit: 43.9 % (ref 37.5–51.0)
Hemoglobin: 14.5 g/dL (ref 13.0–17.7)
Immature Grans (Abs): 0 x10E3/uL (ref 0.0–0.1)
Immature Granulocytes: 0 %
Lymphocytes Absolute: 2 x10E3/uL (ref 0.7–3.1)
Lymphs: 23 %
MCH: 30.9 pg (ref 26.6–33.0)
MCHC: 33 g/dL (ref 31.5–35.7)
MCV: 94 fL (ref 79–97)
Monocytes Absolute: 0.6 x10E3/uL (ref 0.1–0.9)
Monocytes: 6 %
Neutrophils Absolute: 6 x10E3/uL (ref 1.4–7.0)
Neutrophils: 69 %
Platelets: 244 x10E3/uL (ref 150–450)
RBC: 4.69 x10E6/uL (ref 4.14–5.80)
RDW: 12.8 % (ref 11.6–15.4)
WBC: 8.7 x10E3/uL (ref 3.4–10.8)

## 2023-08-27 LAB — LIPID PANEL W/O CHOL/HDL RATIO
Cholesterol, Total: 151 mg/dL (ref 100–199)
HDL: 65 mg/dL (ref >39)
LDL Chol Calc (NIH): 68 mg/dL (ref 0–99)
Triglycerides: 101 mg/dL (ref 0–149)
VLDL Cholesterol Cal: 18 mg/dL (ref 5–40)

## 2023-08-27 LAB — COMPREHENSIVE METABOLIC PANEL WITH GFR
ALT: 23 IU/L (ref 0–44)
AST: 28 IU/L (ref 0–40)
Albumin: 4.4 g/dL (ref 3.8–4.9)
Alkaline Phosphatase: 124 IU/L — ABNORMAL HIGH (ref 44–121)
BUN/Creatinine Ratio: 12 (ref 9–20)
BUN: 12 mg/dL (ref 6–24)
Bilirubin Total: 0.5 mg/dL (ref 0.0–1.2)
CO2: 20 mmol/L (ref 20–29)
Calcium: 9.5 mg/dL (ref 8.7–10.2)
Chloride: 99 mmol/L (ref 96–106)
Creatinine, Ser: 1.03 mg/dL (ref 0.76–1.27)
Globulin, Total: 2.6 g/dL (ref 1.5–4.5)
Glucose: 120 mg/dL — ABNORMAL HIGH (ref 70–99)
Potassium: 4.6 mmol/L (ref 3.5–5.2)
Sodium: 138 mmol/L (ref 134–144)
Total Protein: 7 g/dL (ref 6.0–8.5)
eGFR: 88 mL/min/1.73 (ref >59)

## 2023-08-27 LAB — HEPATITIS B SURFACE ANTIBODY, QUANTITATIVE: Hepatitis B Surf Ab Quant: 865 m[IU]/mL

## 2023-08-27 LAB — PSA: Prostate Specific Ag, Serum: 0.3 ng/mL (ref 0.0–4.0)

## 2023-08-30 ENCOUNTER — Ambulatory Visit: Payer: Self-pay | Admitting: Family Medicine

## 2023-08-31 ENCOUNTER — Other Ambulatory Visit (HOSPITAL_COMMUNITY): Payer: Self-pay

## 2023-10-01 ENCOUNTER — Other Ambulatory Visit: Payer: Self-pay | Admitting: Family Medicine

## 2023-10-01 NOTE — Telephone Encounter (Signed)
 Copied from CRM [id number]. Topic: Clinical - Medication Question >> Oct 01, 2023 12:09 PM Amy B wrote: Reason for CRM: Amy with [facility] called.  Rybelsus  was denied prior auth.  Needs to send documented A1C of 6.5 or greater.  Does not have to be the most recent value.  Fax to [phone].

## 2023-10-01 NOTE — Telephone Encounter (Signed)
 Requested medications are due for refill today.  See note  Requested medications are on the active medications list.  yes  Last refill. 08/26/2023 #90 1 rf  Future visit scheduled.     Notes to clinic.  Copied from CRM [id number]. Topic: Clinical - Medication Question >> Oct 01, 2023 12:09 PM Amy B wrote: Reason for CRM: Amy with [facility] called.  Rybelsus  was denied prior auth.  Needs to send documented A1C of 6.5 or greater.  Does not have to be the most recent value.  Fax to [phone]    Requested Prescriptions  Pending Prescriptions Disp Refills   RYBELSUS  7 MG TABS [Pharmacy Med Name: RYBELSUS  TABS 30'S 7MG ] 90 tablet 3    Sig: TAKE 1 TABLET DAILY     Off-Protocol Failed - 10/01/2023  3:20 PM      Failed - Medication not assigned to a protocol, review manually.      Passed - Valid encounter within last 12 months    Recent Outpatient Visits           1 month ago Routine general medical examination at a health care facility   [place], [place] P, DO

## 2023-10-01 NOTE — Telephone Encounter (Signed)
 Copied from CRM #[id number]. Topic: Clinical - Prescription Issue >> Sep 29, 2023  2:54 PM Willma R wrote: Reason for CRM: Amy from [facility] calling in regards to patient prescription for rybelsus . States his prior champ was declined. Needs to confirm if he has ever had an A1C of 6.5 or greater.  Amy can be reached at [phone]

## 2023-10-01 NOTE — Telephone Encounter (Signed)
 Faxed lab results to [facility] asking them to please expidite prior auth based on patients previous A1C's being greater than 6.5.

## 2023-10-04 ENCOUNTER — Telehealth: Payer: Self-pay

## 2023-10-04 NOTE — Telephone Encounter (Signed)
 Notes and lab results printed.

## 2023-10-04 NOTE — Telephone Encounter (Signed)
 There's another message about this. They needed his A1c >6.5, which he's 100% had- just need to send it

## 2023-10-04 NOTE — Telephone Encounter (Signed)
 Copied from CRM [id number]. Topic: Clinical - Medication Prior Auth >> Oct 04, 2023  9:15 AM Fonda T wrote: Reason for CRM: Received call from patient, states per pharmacy informed him that medication, Semaglutide  (RYBELSUS ) 7 MG TABS, needs prior authorization.  Patient can be reached at [phone] to follow up or to discuss further.   Pharmacy:  [facility] - [place], [place] - [address] [place] [address] Phone: [phone] Fax: [phone]

## 2023-10-05 NOTE — Telephone Encounter (Signed)
 Appeal faxed back 9/8

## 2023-10-08 ENCOUNTER — Other Ambulatory Visit: Payer: Self-pay | Admitting: Family Medicine

## 2023-10-11 ENCOUNTER — Other Ambulatory Visit (HOSPITAL_COMMUNITY): Payer: Self-pay

## 2023-10-11 NOTE — Telephone Encounter (Signed)
 Duplicate request, too soon for refill.  Requested Prescriptions  Pending Prescriptions Disp Refills   RYBELSUS  7 MG TABS [Pharmacy Med Name: RYBELSUS  TABS 30'S 7MG ] 90 tablet 3    Sig: TAKE 1 TABLET DAILY     Off-Protocol Failed - 10/11/2023 10:34 AM      Failed - Medication not assigned to a protocol, review manually.      Passed - Valid encounter within last 12 months    Recent Outpatient Visits           1 month ago Routine general medical examination at a health care facility   [facility] [place], [place] P, DO

## 2023-11-29 IMAGING — MG DIGITAL DIAGNOSTIC BILAT W/ TOMO W/ CAD
6 of 10 series · 6 of 30 positions shown · non-contrast
Comparison: Previous exam(s).

CLINICAL DATA: 50-year-old male presenting for evaluation of
swelling and tenderness behind the right nipple.

EXAM:
DIGITAL DIAGNOSTIC BILATERAL MAMMOGRAM WITH TOMOSYNTHESIS AND CAD
TECHNIQUE: Bilateral digital diagnostic mammography and breast tomosynthesis
was performed. The images were evaluated with computer-aided
detection.

[R MLO synth-2D]
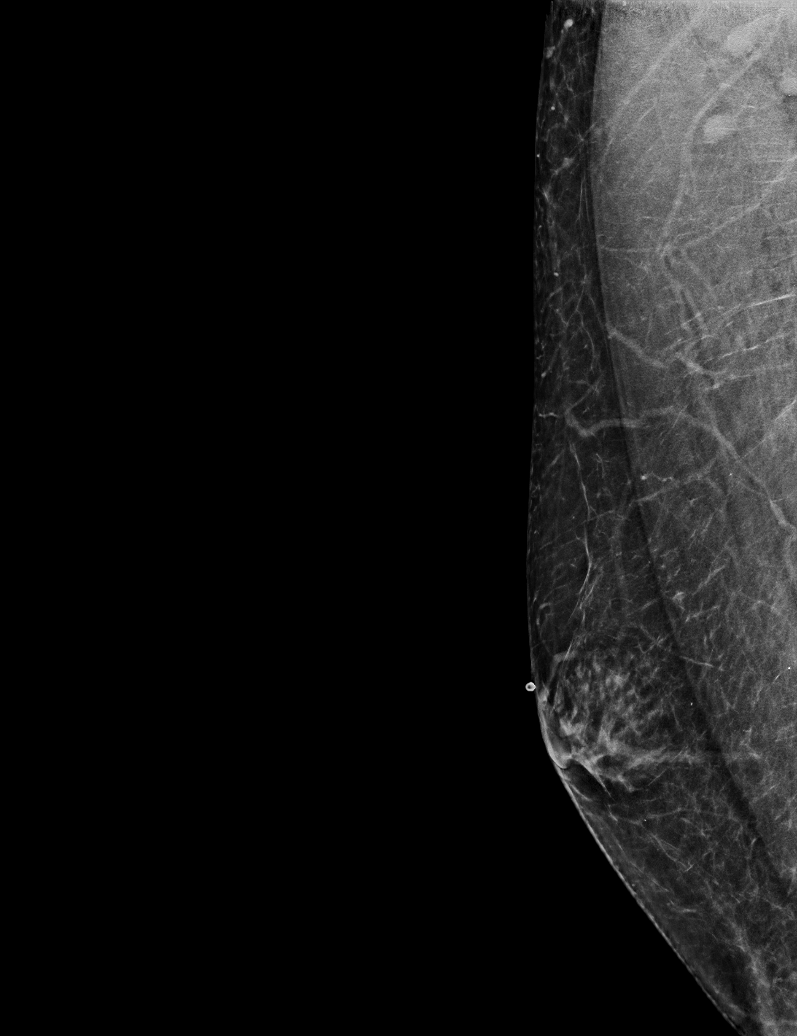

[L MLO synth-2D]
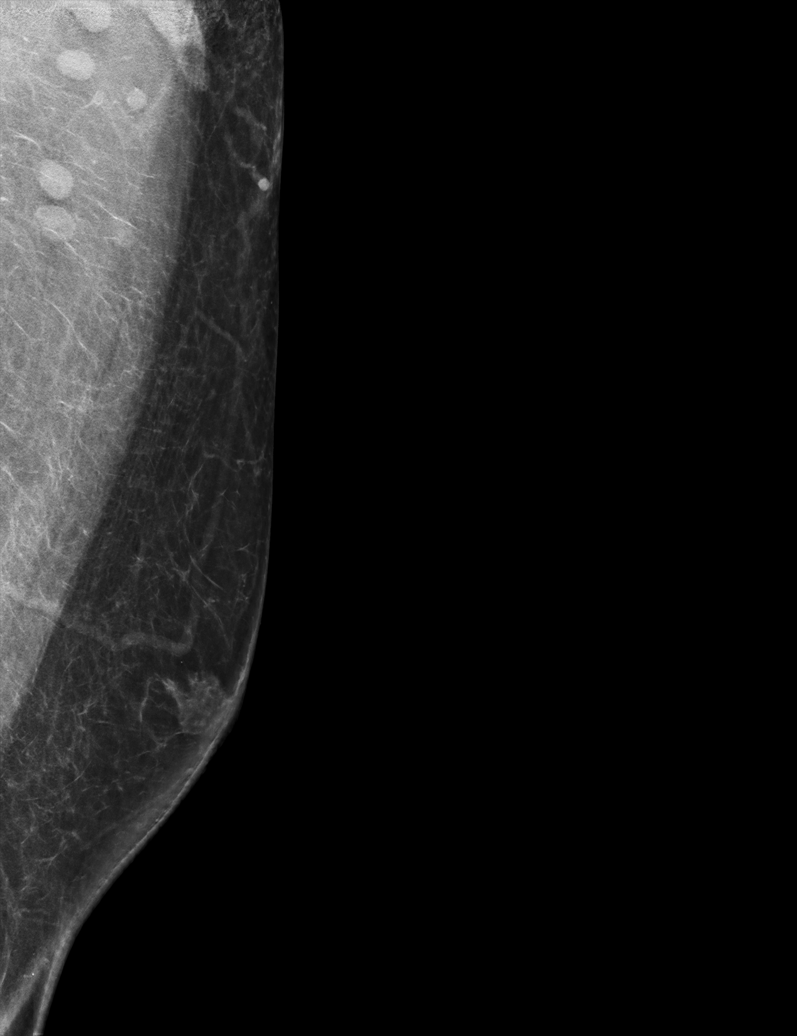

[R CC synth-2D]
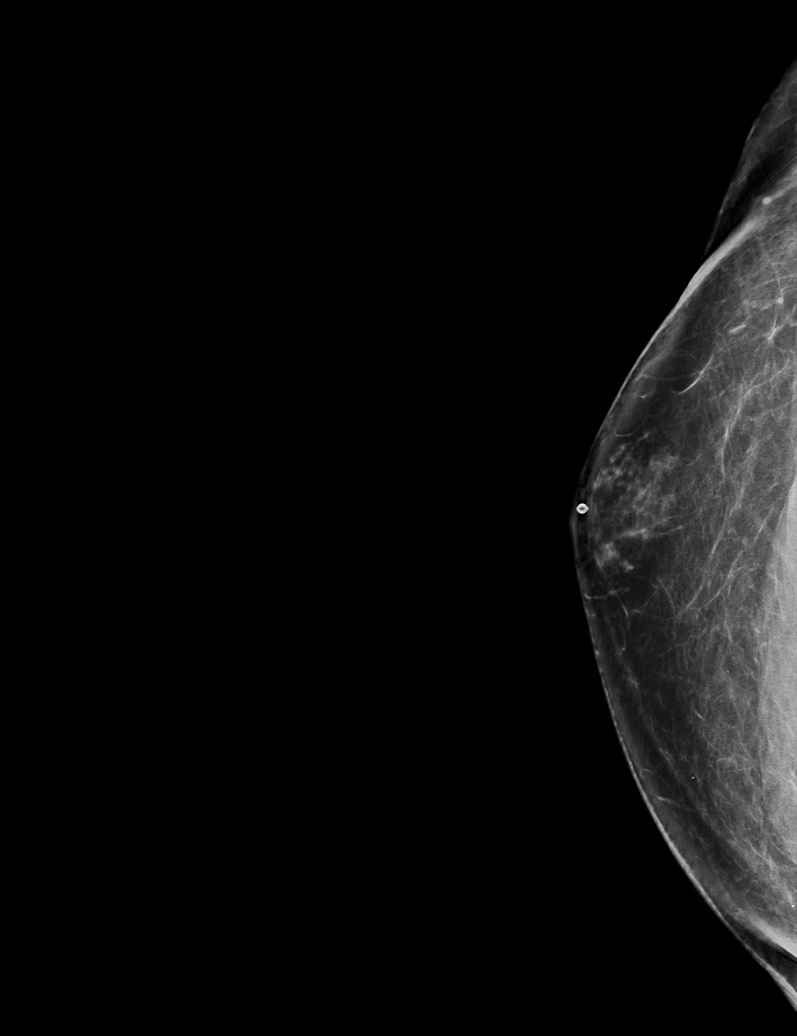

[R TAN synth-2D]
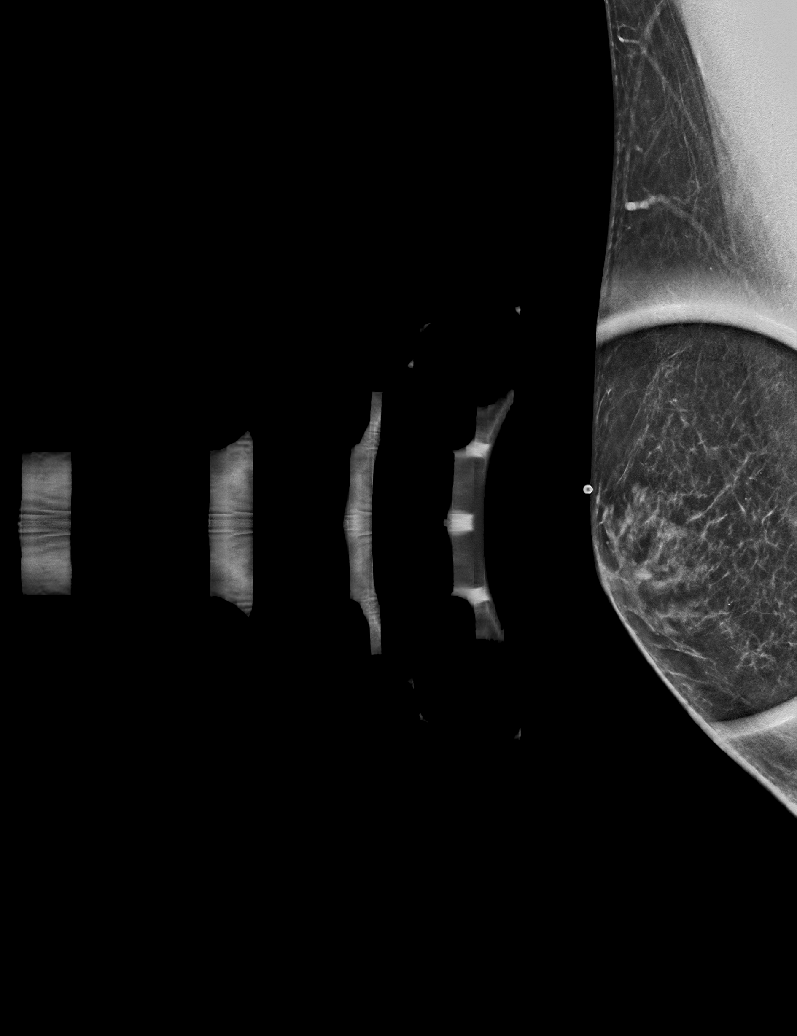

[L CC synth-2D]
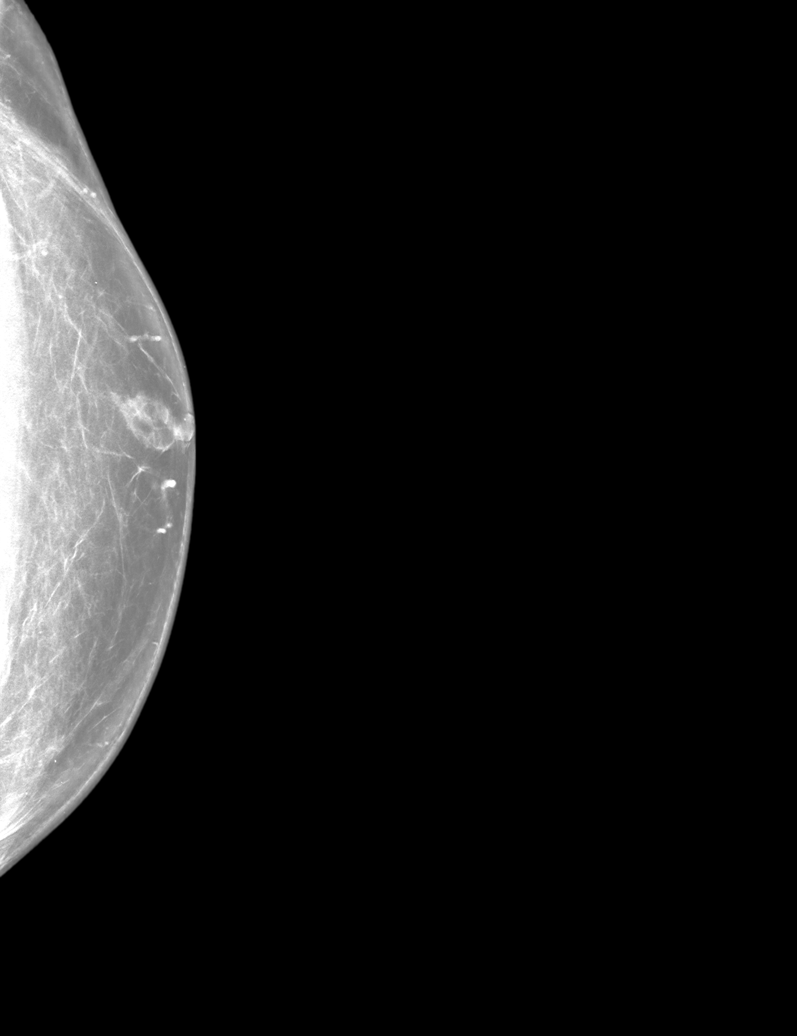

[R MLO tomo · tomo slice 31/62.0]
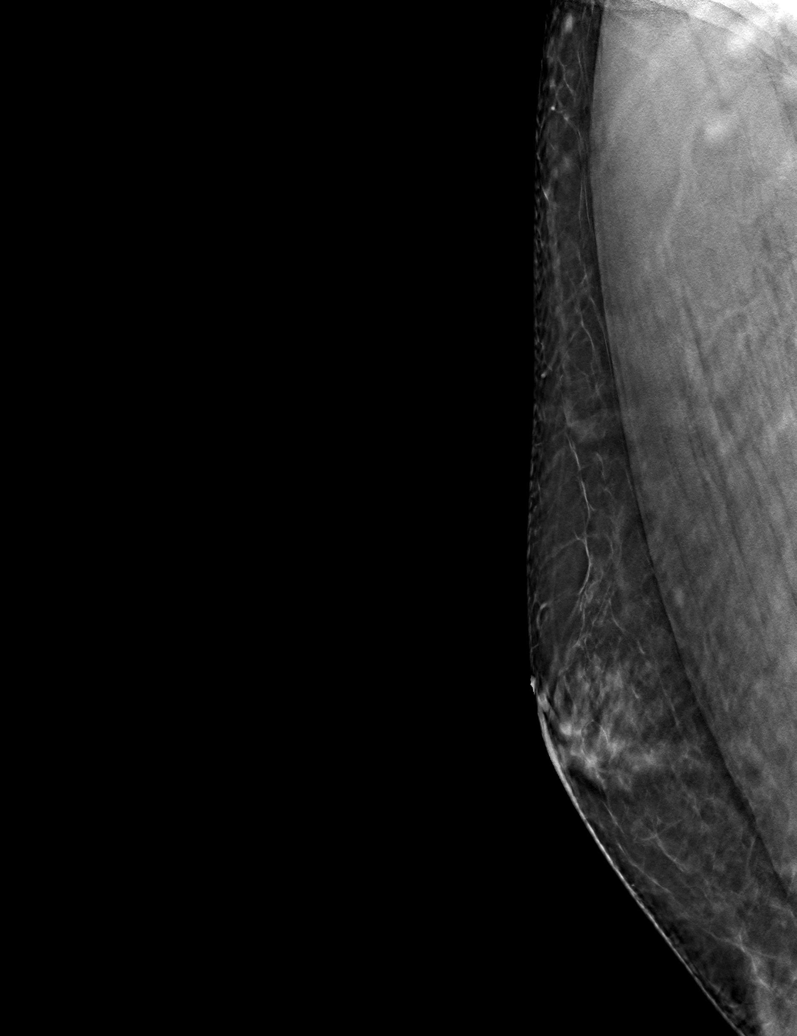

[6 of 30 positions shown; findings below may reference images not displayed]

ACR Breast Density Category b: There are scattered areas of
fibroglandular density.
FINDINGS: Right breast: A skin BB marks the palpable site of concern reported
by the patient in the retroareolar right breast. A spot tangential
view of this area was performed in addition to standard views. There
is a moderate amount of fibroglandular tissue in the retroareolar
aspect consistent with benign gynecomastia. No suspicious mass,
distortion, or microcalcifications are identified to suggest
presence of malignancy.

Left breast: No suspicious mass, distortion, or microcalcifications
are identified to suggest presence of malignancy.

On physical exam of the right breast I feel a soft fullness behind
the right nipple without a fixed discrete mass.
IMPRESSION: Asymmetric right breast gynecomastia.

RECOMMENDATION:
Clinical follow-up.

I discussed with the patient the fact that gynecomastia can occur in
men as testosterone levels decrease with age or in younger men with
low testosterone levels, causing a change in the serum
testosterone:estrogen ratio. We also discussed other potential
etiologies of gynecomastia including numerous prescription
medications and recreational drugs (marijuana and anabolic steroids
in particular). Approximately 20% of cases of gynecomastia are
idiopathic.

BI-RADS CATEGORY  2: Benign.

## 2024-01-25 DIAGNOSIS — H2513 Age-related nuclear cataract, bilateral: Secondary | ICD-10-CM | POA: Diagnosis not present

## 2024-01-25 DIAGNOSIS — H52223 Regular astigmatism, bilateral: Secondary | ICD-10-CM | POA: Diagnosis not present

## 2024-01-25 DIAGNOSIS — H5213 Myopia, bilateral: Secondary | ICD-10-CM | POA: Diagnosis not present

## 2024-01-25 DIAGNOSIS — E119 Type 2 diabetes mellitus without complications: Secondary | ICD-10-CM | POA: Diagnosis not present

## 2024-01-25 LAB — OPHTHALMOLOGY REPORT-SCANNED

## 2024-01-25 LAB — HM DIABETES EYE EXAM

## 2024-01-28 ENCOUNTER — Encounter: Payer: Self-pay | Admitting: Family Medicine

## 2024-02-28 ENCOUNTER — Ambulatory Visit: Admitting: Family Medicine

## 2024-03-01 ENCOUNTER — Ambulatory Visit: Admitting: Family Medicine

## 2024-03-01 ENCOUNTER — Encounter: Payer: Self-pay | Admitting: Family Medicine

## 2024-03-01 VITALS — BP 142/78 | HR 73 | Temp 98.6°F | Resp 17 | Ht 70.51 in | Wt 196.2 lb

## 2024-03-01 DIAGNOSIS — E782 Mixed hyperlipidemia: Secondary | ICD-10-CM

## 2024-03-01 DIAGNOSIS — E119 Type 2 diabetes mellitus without complications: Secondary | ICD-10-CM

## 2024-03-01 DIAGNOSIS — I10 Essential (primary) hypertension: Secondary | ICD-10-CM

## 2024-03-01 LAB — MICROALBUMIN, URINE WAIVED
Creatinine, Urine Waived: 50 mg/dL (ref 10–300)
Microalb, Ur Waived: 10 mg/L (ref 0–19)

## 2024-03-01 LAB — BAYER DCA HB A1C WAIVED: HB A1C (BAYER DCA - WAIVED): 6 % — ABNORMAL HIGH (ref 4.8–5.6)

## 2024-03-01 MED ORDER — HYDROCHLOROTHIAZIDE 12.5 MG PO CAPS: 12.5000 mg | ORAL_CAPSULE | Freq: Every day | ORAL | 1 refills | Status: AC

## 2024-03-01 MED ORDER — RYBELSUS 7 MG PO TABS: 1.0000 | ORAL_TABLET | Freq: Every day | ORAL | 1 refills | Status: AC

## 2024-03-01 MED ORDER — AZELASTINE HCL 137 MCG/SPRAY NA SOLN: 1.0000 | Freq: Two times a day (BID) | NASAL | 1 refills | Status: AC

## 2024-03-01 MED ORDER — FLUTICASONE PROPIONATE 50 MCG/ACT NA SUSP: 2.0000 | Freq: Every day | NASAL | 4 refills | Status: AC

## 2024-03-01 MED ORDER — METFORMIN HCL 500 MG PO TABS: 500.0000 mg | ORAL_TABLET | Freq: Every day | ORAL | 1 refills | Status: AC

## 2024-03-01 MED ORDER — ROSUVASTATIN CALCIUM 40 MG PO TABS: 40.0000 mg | ORAL_TABLET | Freq: Every day | ORAL | 1 refills | Status: AC

## 2024-03-01 MED ORDER — LOSARTAN POTASSIUM 50 MG PO TABS: 50.0000 mg | ORAL_TABLET | Freq: Every day | ORAL | 1 refills | Status: AC

## 2024-03-01 MED ORDER — ALBUTEROL SULFATE HFA 108 (90 BASE) MCG/ACT IN AERS: 2.0000 | INHALATION_SPRAY | Freq: Four times a day (QID) | RESPIRATORY_TRACT | 1 refills | Status: AC | PRN

## 2024-03-01 MED ORDER — RABEPRAZOLE SODIUM 20 MG PO TBEC: 20.0000 mg | DELAYED_RELEASE_TABLET | Freq: Every day | ORAL | 1 refills | Status: AC

## 2024-03-01 NOTE — Progress Notes (Unsigned)
 "  BP (!) 167/92 (BP Location: Left Arm, Patient Position: Sitting, Cuff Size: Normal)   Pulse 73   Temp 98.6 F (37 C) (Oral)   Resp 17   Ht 5' 10.51 (1.791 m)   Wt 196 lb 3.2 oz (89 kg)   SpO2 96%   BMI 27.74 kg/m    Subjective:    Patient ID: Stephen Mayo, male    DOB: March 17, 1970, 54 y.o.   MRN: [id number]  HPI: Stephen Mayo is a 54 y.o. male  Chief Complaint  Patient presents with   Diabetes   DIABETES Hypoglycemic episodes:{Blank single:19197::yes,no} Polydipsia/polyuria: {Blank single:19197::yes,no} Visual disturbance: {Blank single:19197::yes,no} Chest pain: {Blank single:19197::yes,no} Paresthesias: {Blank single:19197::yes,no} Glucose Monitoring: {Blank single:19197::yes,no}  Accucheck frequency: {Blank single:19197::Not Checking,Daily,BID,TID}  Fasting glucose:  Post prandial:  Evening:  Before meals: Taking Insulin?: {Blank single:19197::yes,no}  Long acting insulin:  Short acting insulin: Blood Pressure Monitoring: {Blank single:19197::not checking,rarely,daily,weekly,monthly,a few times a day,a few times a week,a few times a month} Retinal Examination: {Blank single:19197::Up to Date,Not up to Date} Foot Exam: {Blank single:19197::Up to Date,Not up to Date} Diabetic Education: {Blank single:19197::Completed,Not Completed} Pneumovax: {Blank single:19197::Up to Date,Not up to Date,unknown} Influenza: {Blank single:19197::Up to Date,Not up to Date,unknown} Aspirin: {Blank single:19197::yes,no}  HYPERTENSION / HYPERLIPIDEMIA Satisfied with current treatment? {Blank single:19197::yes,no} Duration of hypertension: {Blank single:19197::chronic,months,years} BP monitoring frequency: {Blank single:19197::not checking,rarely,daily,weekly,monthly,a few times a day,a few times a week,a few times a month} BP range:  BP medication side effects: {Blank  single:19197::yes,no} Past BP meds: {Blank multiple:19196::none,amlodipine,amlodipine/benazepril,atenolol,benazepril,benazepril/HCTZ,bisoprolol (bystolic),carvedilol,chlorthalidone,clonidine,diltiazem,exforge HCT,HCTZ,irbesartan (avapro),labetalol,lisinopril ,lisinopril -HCTZ,losartan  (cozaar ),methyldopa,nifedipine,olmesartan (benicar),olmesartan-HCTZ,quinapril,ramipril,spironalactone,tekturna,valsartan,valsartan-HCTZ,verapamil} Duration of hyperlipidemia: {Blank single:19197::chronic,months,years} Cholesterol medication side effects: {Blank single:19197::yes,no} Cholesterol supplements: {Blank multiple:19196::none,fish oil,niacin,red yeast rice} Past cholesterol medications: {Blank multiple:19196::none,atorvastain (lipitor),lovastatin (mevacor),pravastatin  (pravachol ),rosuvastatin  (crestor ),simvastatin (zocor),vytorin,fenofibrate (tricor),gemfibrozil,ezetimide (zetia),niaspan,lovaza} Medication compliance: {Blank single:19197::excellent compliance,good compliance,fair compliance,poor compliance} Aspirin: {Blank single:19197::yes,no} Recent stressors: {Blank single:19197::yes,no} Recurrent headaches: {Blank single:19197::yes,no} Visual changes: {Blank single:19197::yes,no} Palpitations: {Blank single:19197::yes,no} Dyspnea: {Blank single:19197::yes,no} Chest pain: {Blank single:19197::yes,no} Lower extremity edema: {Blank single:19197::yes,no} Dizzy/lightheaded: {Blank single:19197::yes,no}   Relevant past medical, surgical, family and social history reviewed and updated as indicated. Interim medical history since our last visit reviewed. Allergies and medications reviewed and updated.  Review of Systems  Per HPI unless specifically indicated above     Objective:    BP (!) 167/92 (BP Location: Left Arm, Patient Position: Sitting, Cuff Size:  Normal)   Pulse 73   Temp 98.6 F (37 C) (Oral)   Resp 17   Ht 5' 10.51 (1.791 m)   Wt 196 lb 3.2 oz (89 kg)   SpO2 96%   BMI 27.74 kg/m   Wt Readings from Last 3 Encounters:  03/01/24 196 lb 3.2 oz (89 kg)  08/26/23 188 lb (85.3 kg)  02/22/23 196 lb 9.6 oz (89.2 kg)    Physical Exam  Results for orders placed or performed in visit on 02/03/24  OPHTHALMOLOGY REPORT-SCANNED   Collection Time: 01/25/24 12:00 AM  Result Value Ref Range   HM Diabetic Eye Exam No Retinopathy No Retinopathy   A Comment        Assessment & Plan:   Problem List Items Addressed This Visit       Cardiovascular and Mediastinum   HTN (hypertension)     Endocrine   Diabetes mellitus type 2, diet-controlled (HCC) - Primary   Relevant Orders   Bayer DCA Hb A1c Waived   CBC with Differential/Platelet   Comprehensive metabolic panel with GFR   Lipid Panel w/o Chol/HDL Ratio   Microalbumin, Urine Waived  Other   HLD (hyperlipidemia)     Follow up plan: No follow-ups on file.       "

## 2024-03-01 NOTE — Assessment & Plan Note (Signed)
 Under good control on current regimen. Continue current regimen. Continue to monitor. Call with any concerns. Refills given. Labs drawn today.

## 2024-03-01 NOTE — Assessment & Plan Note (Signed)
 Doing well with A1c of 6.0. Continue current regimen. Continue to monitor.

## 2024-03-02 ENCOUNTER — Ambulatory Visit: Payer: Self-pay | Admitting: Family Medicine

## 2024-03-02 ENCOUNTER — Encounter: Payer: Self-pay | Admitting: Family Medicine

## 2024-03-02 LAB — CBC WITH DIFFERENTIAL/PLATELET
Basophils Absolute: 0 10*3/uL (ref 0.0–0.2)
Basos: 1 %
EOS (ABSOLUTE): 0.2 10*3/uL (ref 0.0–0.4)
Eos: 2 %
Hematocrit: 44.5 % (ref 37.5–51.0)
Hemoglobin: 14.8 g/dL (ref 13.0–17.7)
Immature Grans (Abs): 0 10*3/uL (ref 0.0–0.1)
Immature Granulocytes: 0 %
Lymphocytes Absolute: 1.7 10*3/uL (ref 0.7–3.1)
Lymphs: 23 %
MCH: 30.5 pg (ref 26.6–33.0)
MCHC: 33.3 g/dL (ref 31.5–35.7)
MCV: 92 fL (ref 79–97)
Monocytes Absolute: 0.4 10*3/uL (ref 0.1–0.9)
Monocytes: 6 %
Neutrophils Absolute: 4.8 10*3/uL (ref 1.4–7.0)
Neutrophils: 68 %
Platelets: 202 10*3/uL (ref 150–450)
RBC: 4.85 x10E6/uL (ref 4.14–5.80)
RDW: 12.2 % (ref 11.6–15.4)
WBC: 7.1 10*3/uL (ref 3.4–10.8)

## 2024-03-02 LAB — COMPREHENSIVE METABOLIC PANEL WITH GFR
ALT: 16 [IU]/L (ref 0–44)
AST: 19 [IU]/L (ref 0–40)
Albumin: 4.7 g/dL (ref 3.8–4.9)
Alkaline Phosphatase: 93 [IU]/L (ref 47–123)
BUN/Creatinine Ratio: 14 (ref 9–20)
BUN: 12 mg/dL (ref 6–24)
Bilirubin Total: 0.4 mg/dL (ref 0.0–1.2)
CO2: 23 mmol/L (ref 20–29)
Calcium: 10.2 mg/dL (ref 8.7–10.2)
Chloride: 99 mmol/L (ref 96–106)
Creatinine, Ser: 0.83 mg/dL (ref 0.76–1.27)
Globulin, Total: 2.5 g/dL (ref 1.5–4.5)
Glucose: 121 mg/dL — ABNORMAL HIGH (ref 70–99)
Potassium: 4.3 mmol/L (ref 3.5–5.2)
Sodium: 139 mmol/L (ref 134–144)
Total Protein: 7.2 g/dL (ref 6.0–8.5)
eGFR: 105 mL/min/{1.73_m2}

## 2024-03-02 LAB — LIPID PANEL W/O CHOL/HDL RATIO
Cholesterol, Total: 168 mg/dL (ref 100–199)
HDL: 73 mg/dL
LDL Chol Calc (NIH): 77 mg/dL (ref 0–99)
Triglycerides: 99 mg/dL (ref 0–149)
VLDL Cholesterol Cal: 18 mg/dL (ref 5–40)

## 2024-03-02 NOTE — Assessment & Plan Note (Signed)
 Running slightly high, but better at home. Will monitor. Call with any concerns. Refills given. Labs drawn today.

## 2024-08-29 ENCOUNTER — Encounter: Admitting: Family Medicine
